# Patient Record
Sex: Female | Born: 1990 | Race: Black or African American | Hispanic: No | Marital: Single | Smoking: Former smoker
Health system: Southern US, Community
[De-identification: ages and names within clinical notes are randomized; demographics above are authoritative.]

## PROBLEM LIST (undated history)

## (undated) ENCOUNTER — Ambulatory Visit: Admission: EM

## (undated) DIAGNOSIS — G43909 Migraine, unspecified, not intractable, without status migrainosus: Secondary | ICD-10-CM

## (undated) DIAGNOSIS — F32A Depression, unspecified: Secondary | ICD-10-CM

## (undated) DIAGNOSIS — F329 Major depressive disorder, single episode, unspecified: Secondary | ICD-10-CM

## (undated) DIAGNOSIS — T7840XA Allergy, unspecified, initial encounter: Secondary | ICD-10-CM

## (undated) DIAGNOSIS — F419 Anxiety disorder, unspecified: Secondary | ICD-10-CM

## (undated) HISTORY — DX: Anxiety disorder, unspecified: F41.9

## (undated) HISTORY — PX: ADENOIDECTOMY: SUR15

## (undated) HISTORY — DX: Allergy, unspecified, initial encounter: T78.40XA

## (undated) HISTORY — PX: TONSILLECTOMY: SUR1361

## (undated) HISTORY — DX: Major depressive disorder, single episode, unspecified: F32.9

## (undated) HISTORY — DX: Migraine, unspecified, not intractable, without status migrainosus: G43.909

## (undated) HISTORY — DX: Depression, unspecified: F32.A

---
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
## 2010-08-29 ENCOUNTER — Ambulatory Visit: Payer: Self-pay | Admitting: Internal Medicine

## 2010-08-29 DIAGNOSIS — Z0289 Encounter for other administrative examinations: Secondary | ICD-10-CM
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
## 2011-02-08 LAB — DIFFERENTIAL
Basophils Absolute: 0.1 10*3/uL (ref 0.0–0.1)
Eosinophils Absolute: 0 10*3/uL (ref 0.0–0.7)
Eosinophils Relative: 1 % (ref 0–5)
Lymphocytes Relative: 51 % — ABNORMAL HIGH (ref 12–46)
Lymphs Abs: 2.7 10*3/uL (ref 0.7–4.0)
Neutrophils Relative %: 44 % (ref 43–77)

## 2011-02-08 LAB — CBC
HCT: 38.8 % (ref 36.0–46.0)
MCV: 92.7 fL (ref 78.0–100.0)
Platelets: 207 10*3/uL (ref 150–400)
RDW: 13.6 % (ref 11.5–15.5)
WBC: 5.4 10*3/uL (ref 4.0–10.5)

## 2011-02-08 LAB — POCT I-STAT, CHEM 8
Calcium, Ion: 1.29 mmol/L (ref 1.12–1.32)
HCT: 41 % (ref 36.0–46.0)
Sodium: 142 mEq/L (ref 135–145)
TCO2: 25 mmol/L (ref 0–100)

## 2011-02-08 LAB — D-DIMER, QUANTITATIVE: D-Dimer, Quant: <0.22 ug/mL-FEU (ref 0.00–0.48)

## 2011-04-17 ENCOUNTER — Ambulatory Visit: Payer: Self-pay | Admitting: Internal Medicine

## 2011-04-17 DIAGNOSIS — Z0289 Encounter for other administrative examinations: Secondary | ICD-10-CM

## 2012-07-13 ENCOUNTER — Encounter (HOSPITAL_COMMUNITY): Payer: Self-pay | Admitting: *Deleted

## 2012-07-13 ENCOUNTER — Emergency Department (HOSPITAL_COMMUNITY)
Admission: EM | Admit: 2012-07-13 | Discharge: 2012-07-13 | Disposition: A | Discharge status: Home / Self Care | Payer: 59 | Source: Home / Self Care

## 2012-07-13 DIAGNOSIS — R0789 Other chest pain: Secondary | ICD-10-CM

## 2012-07-13 DIAGNOSIS — H8110 Benign paroxysmal vertigo, unspecified ear: Secondary | ICD-10-CM

## 2012-07-13 DIAGNOSIS — H9190 Unspecified hearing loss, unspecified ear: Secondary | ICD-10-CM

## 2012-07-13 DIAGNOSIS — H9192 Unspecified hearing loss, left ear: Secondary | ICD-10-CM

## 2012-07-13 DIAGNOSIS — R071 Chest pain on breathing: Secondary | ICD-10-CM

## 2012-07-13 NOTE — ED Notes (Signed)
Pt  Has  multiple  Symptoms  To include  Chest  Wall   pai   Worse  When  She  Performs  Certain  Movements  And  posistions                 She  Also  Reports  Symptoms  ogf  Being  Dizzy   As  Well       -  She  Reports  l  Ear  Muffled  X  1  Week  - the  Chest pain  Have  Been  For  About  4  Day  -  Heshe is  Sitting  Upright on the  Exam table  She  Is  Speaking in  Complete  sentances  And  Is  In no  acute  Distress

## 2012-07-13 NOTE — ED Provider Notes (Signed)
History     CSN: [id number]  Arrival date & time 07/13/12  1614   First MD Initiated Contact with Patient 07/13/12 1837      Chief Complaint  Patient presents with  . Chest Pain    (Consider location/radiation/quality/duration/timing/severity/associated sxs/prior treatment) HPI Comments: Pt also c/o muffled hearing in L ear for 1 week. Wonders if has an infection because wears a headset on that side at work.  Also c/o dizziness, room spinning for 3-4 days.   Patient is a 22 y.o. female presenting with chest pain. The history is provided by the patient.  Chest Pain Chest pain location: B middle chest. Pain quality comment:  Sore Pain radiates to:  Does not radiate Pain severity:  Moderate Onset quality:  Gradual Duration:  1 week Timing:  Constant Progression:  Unchanged Chronicity:  New Context comment:  Activity, movement, pushing on it Relieved by:  Nothing Exacerbated by: activity, movement, pushing on it. Ineffective treatments: ibuprofen BID. Associated symptoms: dizziness   Associated symptoms: no back pain, no cough, no fever, no headache, no nausea, no numbness, no palpitations, no shortness of breath, no syncope and not vomiting     History reviewed. No pertinent past medical history.  History reviewed. No pertinent past surgical history.  History reviewed. No pertinent family history.  History  Substance Use Topics  . Smoking status: Never Smoker   . Smokeless tobacco: Not on file  . Alcohol Use: Yes    OB History   Grav Para Term Preterm Abortions TAB SAB Ect Mult Living                  Review of Systems  Constitutional: Negative for fever and chills.  HENT: Positive for hearing loss. Negative for ear pain, congestion, sinus pressure, tinnitus and ear discharge.   Respiratory: Negative for cough and shortness of breath.   Cardiovascular: Negative for chest pain, palpitations and syncope.  Gastrointestinal: Negative for nausea and vomiting.    Musculoskeletal: Negative for back pain.       Chest pain  Neurological: Positive for dizziness. Negative for syncope, numbness and headaches.    Allergies  Review of patient's allergies indicates no known allergies.  Home Medications   Current Outpatient Rx  Name  Route  Sig  Dispense  Refill  . PARoxetine HCl (PAXIL PO)   Oral   Take by mouth.           BP 135/63  Pulse 60  Temp(Src) 98.9 F (37.2 C) (Oral)  Resp 16  SpO2 100%  LMP 07/08/2012  Physical Exam  Constitutional: She is oriented to person, place, and time. She appears well-developed and well-nourished. No distress.  HENT:  Right Ear: Tympanic membrane and ear canal normal.  Left Ear: Tympanic membrane and ear canal normal.  Eyes: Conjunctivae and EOM are normal. Pupils are equal, round, and reactive to light.  Cardiovascular: Normal rate and regular rhythm.   Pulmonary/Chest: Effort normal and breath sounds normal. She exhibits tenderness. She exhibits no mass, no bony tenderness, no edema and no deformity.    Neurological: She is alert and oriented to person, place, and time. Coordination and gait normal.  Positive dix-hallpike to R side    ED Course  Procedures (including critical care time)  Labs Reviewed - No data to display No results found.   1. BPPV (benign paroxysmal positional vertigo)   2. Chest wall pain   3. Change in hearing, left       MDM  Performed epley maneuver and pt's vertigo completely resolved. Pt to f/u with ent for hearing changes.         Cathlyn Parsons, NP 07/13/12 1845

## 2013-01-07 ENCOUNTER — Ambulatory Visit: Payer: Self-pay | Admitting: Internal Medicine

## 2013-04-08 ENCOUNTER — Emergency Department (HOSPITAL_COMMUNITY)
Admission: EM | Admit: 2013-04-08 | Discharge: 2013-04-08 | Disposition: A | Discharge status: Home / Self Care | Payer: 59 | Attending: Emergency Medicine | Admitting: Emergency Medicine

## 2013-04-08 ENCOUNTER — Encounter (HOSPITAL_COMMUNITY): Payer: Self-pay | Admitting: Emergency Medicine

## 2013-04-08 DIAGNOSIS — R197 Diarrhea, unspecified: Secondary | ICD-10-CM | POA: Insufficient documentation

## 2013-04-08 DIAGNOSIS — Z79899 Other long term (current) drug therapy: Secondary | ICD-10-CM | POA: Insufficient documentation

## 2013-04-08 DIAGNOSIS — R112 Nausea with vomiting, unspecified: Secondary | ICD-10-CM | POA: Insufficient documentation

## 2013-04-08 LAB — CBC WITH DIFFERENTIAL/PLATELET
Basophils Absolute: 0 10*3/uL (ref 0.0–0.1)
Lymphocytes Relative: 13 % (ref 12–46)
Lymphs Abs: 1.3 10*3/uL (ref 0.7–4.0)
Neutro Abs: 8.4 10*3/uL — ABNORMAL HIGH (ref 1.7–7.7)
Neutrophils Relative %: 82 % — ABNORMAL HIGH (ref 43–77)
Platelets: 172 10*3/uL (ref 150–400)
RBC: 5.02 MIL/uL (ref 3.87–5.11)
RDW: 13.3 % (ref 11.5–15.5)
WBC: 10.2 10*3/uL (ref 4.0–10.5)

## 2013-04-08 LAB — COMPREHENSIVE METABOLIC PANEL
ALT: 16 U/L (ref 0–35)
AST: 21 U/L (ref 0–37)
Alkaline Phosphatase: 59 U/L (ref 39–117)
CO2: 20 mEq/L (ref 19–32)
Calcium: 10.2 mg/dL (ref 8.4–10.5)
Chloride: 101 mEq/L (ref 96–112)
GFR calc non Af Amer: >90 mL/min (ref >90)
Potassium: 3.6 mEq/L (ref 3.5–5.1)
Sodium: 138 mEq/L (ref 135–145)

## 2013-04-08 MED ORDER — ONDANSETRON 4 MG PO TBDP: 4.0000 mg | ORAL_TABLET | Freq: Three times a day (TID) | ORAL | Status: DC | PRN

## 2013-04-08 MED ORDER — MORPHINE SULFATE 4 MG/ML IJ SOLN
4.0000 mg | Freq: Once | INTRAMUSCULAR | Status: AC
  Administered 2013-04-08: 4 mg via INTRAVENOUS
  Filled 2013-04-08: qty 1

## 2013-04-08 MED ORDER — SODIUM CHLORIDE 0.9 % IV BOLUS (SEPSIS)
1000.0000 mL | Freq: Once | INTRAVENOUS | Status: AC
  Administered 2013-04-08: 1000 mL via INTRAVENOUS

## 2013-04-08 MED ORDER — ONDANSETRON HCL 4 MG/2ML IJ SOLN
4.0000 mg | Freq: Once | INTRAMUSCULAR | Status: AC
  Administered 2013-04-08: 4 mg via INTRAVENOUS
  Filled 2013-04-08: qty 2

## 2013-04-08 NOTE — ED Provider Notes (Signed)
CSN: [id number]     Arrival date & time 04/08/13  1232 History   First MD Initiated Contact with Patient 04/08/13 1251     Chief Complaint  Patient presents with  . Nausea  . Diarrhea   (Consider location/radiation/quality/duration/timing/severity/associated sxs/prior Treatment) Patient is a 22 y.o. female presenting with diarrhea. The history is provided by the patient and medical records.  Diarrhea Associated symptoms: vomiting    This is a 22 year old female with no significant past medical history presenting to the ED for nausea, vomiting, and diarrhea. Patient states symptoms began with only diarrhea approximately 3 days ago. She has been having intermittent nausea for the past 2 days, but became constant last night and has had recurrent non bloody, non bililous, vomiting throughout the day today. She's been unable to tolerate anything PO today. She admits to subjective fever, sweats, and chills. She denies any recent sick contacts.  No changes in diet or meds.  No recent abx use.  VS stable on arrival.  History reviewed. No pertinent past medical history. History reviewed. No pertinent past surgical history. History reviewed. No pertinent family history. History  Substance Use Topics  . Smoking status: Never Smoker   . Smokeless tobacco: Not on file  . Alcohol Use: Yes   OB History   Grav Para Term Preterm Abortions TAB SAB Ect Mult Living                 Review of Systems  Gastrointestinal: Positive for nausea, vomiting and diarrhea.  All other systems reviewed and are negative.    Allergies  Review of patient's allergies indicates no known allergies.  Home Medications   Current Outpatient Rx  Name  Route  Sig  Dispense  Refill  . PARoxetine HCl (PAXIL PO)   Oral   Take 10 mg by mouth daily.           BP 124/80  Pulse 81  Temp(Src) 98 F (36.7 C) (Oral)  Resp 16  Wt 175 lb (79.379 kg)  SpO2 100%  LMP 02/06/2013  Physical Exam  Nursing note and vitals  reviewed. Constitutional: She is oriented to person, place, and time. She appears well-developed and well-nourished. No distress.  HENT:  Head: Normocephalic and atraumatic.  Mouth/Throat: Oropharynx is clear and moist.  Eyes: Conjunctivae and EOM are normal. Pupils are equal, round, and reactive to light.  Neck: Normal range of motion. Neck supple.  Cardiovascular: Normal rate, regular rhythm and normal heart sounds.   Pulmonary/Chest: Effort normal and breath sounds normal. No respiratory distress. She has no wheezes.  Abdominal: Soft. Bowel sounds are normal. There is generalized tenderness. There is no guarding.  Abdomen soft, nondistended, generalized pain  Musculoskeletal: Normal range of motion.  Neurological: She is alert and oriented to person, place, and time.  Skin: Skin is warm and dry. She is not diaphoretic.  Psychiatric: She has a normal mood and affect.    ED Course  Procedures (including critical care time) Labs Review Labs Reviewed  CBC WITH DIFFERENTIAL - Abnormal; Notable for the following:    Neutrophils Relative % 82 (*)    Neutro Abs 8.4 (*)    All other components within normal limits  COMPREHENSIVE METABOLIC PANEL - Abnormal; Notable for the following:    Glucose, Bld 107 (*)    Total Protein 8.4 (*)    All other components within normal limits  URINALYSIS, ROUTINE W REFLEX MICROSCOPIC   Imaging Review No results found.  EKG Interpretation  None       MDM   1. Nausea & vomiting   2. Diarrhea    Labs as above, no leukocytosis.  Symptoms greatly improved following IVF and meds.  Pt has been able to tolerate PO without difficulty or recurrent vomiting.  At this time i doubt acute or surgical abdomen, sx likely viral in nature.  Pt afebrile, non-toxic appearing, NAD, VS stable- ok for discharge.  Rx zofran.  Instructed to stay hydrated, start with bland diet and progress as tolerated.  Discussed plan with pt, she agreed.  Return precautions  advised.    Garlon Hatchet, PA-C 04/08/13 1523

## 2013-04-08 NOTE — ED Notes (Signed)
Pt aware of need for urine specimen. Unable to void at this time. 

## 2013-04-08 NOTE — ED Notes (Signed)
Pt reminded of need for urine. States she will be able to urinate soon.

## 2013-04-08 NOTE — ED Provider Notes (Signed)
Medical screening examination/treatment/procedure(s) were performed by non-physician practitioner and as supervising physician I was immediately available for consultation/collaboration.  EKG Interpretation   None         Megan E Docherty, MD 04/08/13 1608 

## 2013-04-08 NOTE — ED Notes (Signed)
Pt alert, nad, resp even unlabored, c/o nausea, emesis, diarrhea, onset was a few days ago

## 2013-04-13 ENCOUNTER — Emergency Department (HOSPITAL_COMMUNITY)
Admission: EM | Admit: 2013-04-13 | Discharge: 2013-04-13 | Disposition: A | Discharge status: Home / Self Care | Payer: 59 | Attending: Emergency Medicine | Admitting: Emergency Medicine

## 2013-04-13 ENCOUNTER — Encounter (HOSPITAL_COMMUNITY): Payer: Self-pay | Admitting: Emergency Medicine

## 2013-04-13 DIAGNOSIS — G43909 Migraine, unspecified, not intractable, without status migrainosus: Secondary | ICD-10-CM | POA: Insufficient documentation

## 2013-04-13 DIAGNOSIS — Z791 Long term (current) use of non-steroidal anti-inflammatories (NSAID): Secondary | ICD-10-CM | POA: Insufficient documentation

## 2013-04-13 DIAGNOSIS — Z79899 Other long term (current) drug therapy: Secondary | ICD-10-CM | POA: Insufficient documentation

## 2013-04-13 DIAGNOSIS — Z3202 Encounter for pregnancy test, result negative: Secondary | ICD-10-CM | POA: Insufficient documentation

## 2013-04-13 LAB — POCT I-STAT, CHEM 8
BUN: 6 mg/dL (ref 6–23)
Creatinine, Ser: 0.6 mg/dL (ref 0.50–1.10)
Potassium: 3.9 mEq/L (ref 3.5–5.1)
Sodium: 143 mEq/L (ref 135–145)
TCO2: 24 mmol/L (ref 0–100)

## 2013-04-13 LAB — URINALYSIS, ROUTINE W REFLEX MICROSCOPIC
Bilirubin Urine: NEGATIVE
Glucose, UA: NEGATIVE mg/dL
Hgb urine dipstick: NEGATIVE
Ketones, ur: NEGATIVE mg/dL
Nitrite: NEGATIVE
Protein, ur: NEGATIVE mg/dL
Specific Gravity, Urine: 1.011 (ref 1.005–1.030)
Urobilinogen, UA: 0.2 mg/dL (ref 0.0–1.0)
pH: 7.5 (ref 5.0–8.0)

## 2013-04-13 LAB — URINE MICROSCOPIC-ADD ON

## 2013-04-13 LAB — PREGNANCY, URINE: Preg Test, Ur: NEGATIVE

## 2013-04-13 MED ORDER — SODIUM CHLORIDE 0.9 % IV BOLUS (SEPSIS)
2000.0000 mL | Freq: Once | INTRAVENOUS | Status: AC
  Administered 2013-04-13: 2000 mL via INTRAVENOUS

## 2013-04-13 MED ORDER — DIPHENHYDRAMINE HCL 50 MG/ML IJ SOLN
25.0000 mg | Freq: Once | INTRAMUSCULAR | Status: AC
  Administered 2013-04-13: 25 mg via INTRAVENOUS
  Filled 2013-04-13: qty 1

## 2013-04-13 MED ORDER — DEXAMETHASONE SODIUM PHOSPHATE 10 MG/ML IJ SOLN
10.0000 mg | Freq: Once | INTRAMUSCULAR | Status: AC
  Administered 2013-04-13: 10 mg via INTRAVENOUS
  Filled 2013-04-13: qty 1

## 2013-04-13 MED ORDER — PROCHLORPERAZINE EDISYLATE 5 MG/ML IJ SOLN
10.0000 mg | Freq: Once | INTRAMUSCULAR | Status: AC
  Administered 2013-04-13: 10 mg via INTRAVENOUS
  Filled 2013-04-13: qty 2

## 2013-04-13 MED ORDER — KETOROLAC TROMETHAMINE 30 MG/ML IJ SOLN
30.0000 mg | Freq: Once | INTRAMUSCULAR | Status: AC
  Administered 2013-04-13: 30 mg via INTRAVENOUS
  Filled 2013-04-13: qty 1

## 2013-04-13 NOTE — Progress Notes (Signed)
   CARE MANAGEMENT ED NOTE 04/13/2013  Patient:  Tracy Cohen, Tracy Cohen   Account Number:  [id number]  Date Initiated:  04/13/2013  Documentation initiated by:  Edd Arbour  Subjective/Objective Assessment:   22 yr old female  umr pt with 2 ED visits in last 6 months no admissions Pt listed without a pcp Last ED visit for N/V/D this ED vist for headache     Subjective/Objective Assessment Detail:     Action/Plan:   CM spoke with pt, her mother, EDP/NP CM spoke with pt about importance of pcp, specialist follow care CM provided pt with a list of internal medicine & neurologist [place] 5-10 [place] [address] as listed in EPIC   Action/Plan Detail:   Anticipated DC Date:  04/13/2013     Status Recommendation to Physician:   Result of Recommendation:    Other ED Services  Consult Working Plan    DC Planning Services  Other  PCP issues  Outpatient Services - Pt will follow up    Choice offered to / List presented to:            Status of service:  Completed, signed off  ED Comments:   ED Comments Detail:

## 2013-04-13 NOTE — ED Notes (Addendum)
Pt c/o migraine headache x 8 days; history of migraines; constant; states is getting worse; c/o dizziness; states eyes wont focus; states worst headache ever

## 2013-04-13 NOTE — ED Provider Notes (Signed)
CSN: [id number]     Arrival date & time 04/13/13  1119 History   First MD Initiated Contact with Patient 04/13/13 1138     Chief Complaint  Patient presents with  . Migraine   (Consider location/radiation/quality/duration/timing/severity/associated sxs/prior Treatment) HPI Patient presents emergency department with migraine headache, which been ongoing for the last 4 days.  Patient, states she was seen in emergency department.  This past Thursday she did not mention her headache.  Patient was seen for nausea, vomiting, and diarrhea.  Patient denies blurred vision, weakness, numbness, dizziness, syncope, fever, chest pain, or shortness of breath.  Patient, states, that she did not take any medications prior to arrival for her symptoms.  Patient, states, that light makes her headache, worse History reviewed. No pertinent past medical history. History reviewed. No pertinent past surgical history. No family history on file. History  Substance Use Topics  . Smoking status: Never Smoker   . Smokeless tobacco: Not on file  . Alcohol Use: Yes   OB History   Grav Para Term Preterm Abortions TAB SAB Ect Mult Living                 Review of Systems All other systems negative except as documented in the HPI. All pertinent positives and negatives as reviewed in the HPI.  Allergies  Review of patient's allergies indicates no known allergies.  Home Medications   Current Outpatient Rx  Name  Route  Sig  Dispense  Refill  . etonogestrel (IMPLANON) 68 MG IMPL implant   Subcutaneous   Inject 1 each into the skin once. Placed in Jan 2014         . naproxen sodium (ANAPROX) 220 MG tablet   Oral   Take 440 mg by mouth 2 (two) times daily with a meal.         . PARoxetine HCl (PAXIL PO)   Oral   Take 10 mg by mouth daily.           BP 124/81  Pulse 77  Temp(Src) 98.2 F (36.8 C) (Oral)  Resp 16  SpO2 100%  LMP 02/06/2013 Physical Exam  Nursing note and vitals  reviewed. Constitutional: She is oriented to person, place, and time. She appears well-developed and well-nourished.  HENT:  Head: Normocephalic and atraumatic.  Eyes: EOM are normal. Pupils are equal, round, and reactive to light.  Neck: Normal range of motion. Neck supple.  Cardiovascular: Normal rate, regular rhythm and normal heart sounds.  Exam reveals no gallop and no friction rub.   No murmur heard. Pulmonary/Chest: Effort normal and breath sounds normal.  Neurological: She is alert and oriented to person, place, and time. She exhibits normal muscle tone. Coordination normal.  Skin: Skin is warm and dry.    ED Course  Procedures (including critical care time) Labs Review Labs Reviewed  URINALYSIS, ROUTINE W REFLEX MICROSCOPIC - Abnormal; Notable for the following:    APPearance CLOUDY (*)    Leukocytes, UA SMALL (*)    All other components within normal limits  POCT I-STAT, CHEM 8 - Abnormal; Notable for the following:    Hemoglobin 15.6 (*)    All other components within normal limits  PREGNANCY, URINE  URINE MICROSCOPIC-ADD ON    Patient has complete resolution of her headache.  Following Toradol, Benadryl, Compazine and Decadron with IV fluids.  Patient is referred to go for neurology.  She is told to return here for any worsening in her condition  Jamesetta Orleans  Murphy Duzan, PA-C 04/14/13 1536

## 2013-04-16 NOTE — ED Provider Notes (Signed)
Medical screening examination/treatment/procedure(s) were performed by non-physician practitioner and as supervising physician I was immediately available for consultation/collaboration.  EKG Interpretation   None         Lillyann Ahart M Ryah Cribb, MD 04/16/13 2305 

## 2013-04-18 ENCOUNTER — Encounter (HOSPITAL_COMMUNITY): Payer: Self-pay | Admitting: Emergency Medicine

## 2013-04-18 ENCOUNTER — Emergency Department (HOSPITAL_COMMUNITY)
Admission: EM | Admit: 2013-04-18 | Discharge: 2013-04-18 | Discharge status: Against Medical Advice | Payer: 59 | Attending: Emergency Medicine | Admitting: Emergency Medicine

## 2013-04-18 DIAGNOSIS — R110 Nausea: Secondary | ICD-10-CM | POA: Insufficient documentation

## 2013-04-18 DIAGNOSIS — G43909 Migraine, unspecified, not intractable, without status migrainosus: Secondary | ICD-10-CM | POA: Insufficient documentation

## 2013-04-18 NOTE — ED Notes (Signed)
No answer when pt's name called in lobby, unable to locate pt 

## 2013-04-18 NOTE — ED Notes (Signed)
Pt states that migraine has been going on since the last time she was seen here for it, which was 04/13/13. Pt nauseated but denies vomiting.

## 2013-04-18 NOTE — ED Notes (Signed)
No answer when pt called in lobby, unable to locate pt.

## 2013-04-20 ENCOUNTER — Ambulatory Visit (INDEPENDENT_AMBULATORY_CARE_PROVIDER_SITE_OTHER): Payer: 59

## 2013-04-20 ENCOUNTER — Encounter: Payer: Self-pay | Admitting: Neurology

## 2013-04-20 ENCOUNTER — Ambulatory Visit (INDEPENDENT_AMBULATORY_CARE_PROVIDER_SITE_OTHER): Payer: 59 | Admitting: Neurology

## 2013-04-20 VITALS — BP 121/82 | HR 67 | Ht 64.0 in | Wt 160.0 lb

## 2013-04-20 VITALS — BP 111/73 | HR 66 | Temp 99.3°F

## 2013-04-20 DIAGNOSIS — G43909 Migraine, unspecified, not intractable, without status migrainosus: Secondary | ICD-10-CM

## 2013-04-20 MED ORDER — VALPROATE SODIUM 500 MG/5ML IV SOLN
1000.0000 mg | INTRAVENOUS | Status: DC
  Administered 2013-04-20: 1000 mg via INTRAVENOUS

## 2013-04-20 MED ORDER — VALPROATE SODIUM 500 MG/5ML IV SOLN: 1000.0000 mg | INTRAVENOUS | Status: AC

## 2013-04-20 MED ORDER — KETOROLAC TROMETHAMINE 30 MG/ML IJ SOLN
30.0000 mg | Freq: Once | INTRAMUSCULAR | Status: AC
  Administered 2013-04-20: 30 mg via INTRAMUSCULAR

## 2013-04-20 MED ORDER — SODIUM CHLORIDE 0.9 % IV SOLN: 125.0000 mg | Freq: Once | INTRAVENOUS | Status: DC

## 2013-04-20 MED ORDER — RIZATRIPTAN BENZOATE 10 MG PO TBDP: 10.0000 mg | ORAL_TABLET | ORAL | Status: DC | PRN

## 2013-04-20 MED ORDER — NORTRIPTYLINE HCL 10 MG PO CAPS: ORAL_CAPSULE | ORAL | Status: DC

## 2013-04-20 MED ORDER — SODIUM CHLORIDE 0.9 % IV SOLN
125.0000 mg | INTRAVENOUS | Status: DC
  Administered 2013-04-20: 125 mg via INTRAVENOUS

## 2013-04-20 MED ORDER — SODIUM CHLORIDE 0.9 % IV SOLN: 125.0000 mg | INTRAVENOUS | Status: AC

## 2013-04-20 MED ORDER — SODIUM CHLORIDE 0.9 % IV SOLN: 125.0000 mg | INTRAVENOUS | Status: DC

## 2013-04-20 NOTE — Patient Instructions (Signed)
Counseled on prevention of migraines through good rest, exercise, hydration, stress reduction and medication management. A letter was generated for and give to the patient for lost time from work along with the AVS.

## 2013-04-20 NOTE — Progress Notes (Signed)
First dose of depacon administered 500 mg followed by second dose of 500 mg and then followed by solumedrol 125 mg via IV solution of 0.9% NS solution (total 0.9% NS Solution - 300 ML) administered under aseptic technique.   Patient reports pain levels of 12/10 prior to initial administration. Pain of 8/10 after solumedrol. Administered toradol IM under aseptic technique.   IV removed and bandage applied.  All administration tolerated well.

## 2013-04-23 ENCOUNTER — Telehealth: Payer: Self-pay | Admitting: *Deleted

## 2013-04-23 DIAGNOSIS — G43909 Migraine, unspecified, not intractable, without status migrainosus: Secondary | ICD-10-CM | POA: Insufficient documentation

## 2013-04-23 NOTE — Telephone Encounter (Signed)
I have called her fail to reach her, left message.,  Nortriptyline will take few weeks to take effect. Maxalt as needed, keep follow up.

## 2013-04-23 NOTE — Progress Notes (Signed)
[facility]  PATIENT: Tracy Cohen DOB: 05-14-1990  HISTORICAL Tracy Cohen is a 22 years old right-handed Caucasian female, referred by emergency room for evaluation of migraine headaches  She had episodes of migraine headaches when she was younger, in her teens, but only rarely happen, over the past 5 years, she began to have frequent headaches, about twice a month, she complains lightheaded, dizziness, nausea, with her headaches, relieved by lying darkened quiet room.  She had diarrhea, nausea woman in December fourth, went to the emergency room, symptoms improved after IV fluid, woke up December 9th 2014, noticed severe pounding headaches, holocranial, with associated light noise sensitivity, nauseous, vomitting, she presented to the emergency room again, was given IV cocktail of Compazine, Decadron, Toradol, only with temporary relief of her headaches,  She came in today, complains of persistent headaches since December ninth, nausea or vomiting, difficulty eating, described 12 out of 10 headache today, was associated light sensitivity, she denies visual change, denies lateralized motor or sensory deficit,    REVIEW OF SYSTEMS: Full 14 system review of systems performed and notable only for eye pain, headache, weakness, dizziness, not enough sleep, decreased appetite   ALLERGIES: No Known Allergies  HOME MEDICATIONS: Outpatient Prescriptions Prior to Visit  Medication Sig Dispense Refill  . etonogestrel (IMPLANON) 68 MG IMPL implant Inject 1 each into the skin once. Placed in Jan 2014      . naproxen sodium (ANAPROX) 220 MG tablet Take 440 mg by mouth 2 (two) times daily with a meal.      . PARoxetine HCl (PAXIL PO) Take 10 mg by mouth daily.        No facility-administered medications prior to visit.    PAST MEDICAL HISTORY: Past Medical History  Diagnosis Date  . Migraine   . Headache   . Weakness   . Dizziness   . Anxiety     PAST SURGICAL  HISTORY: Past Surgical History  Procedure Laterality Date  . Tonsillectomy    . Adenoidectomy      FAMILY HISTORY: Family History  Problem Relation Age of Onset  . High blood pressure Mother     SOCIAL HISTORY:  History   Social History  . Marital Status: Married    Spouse Name: N/A    Number of Children: 0  . Years of Education: 13   Occupational History  .      [employer]   Social History Main Topics  . Smoking status: Never Smoker   . Smokeless tobacco: Never Used  . Alcohol Use: Yes  . Drug Use: No  . Sexual Activity: Not on file   Other Topics Concern  . Not on file   Social History Narrative   Patient lives at home alone.    Patient works for [employer]   Patient has some college education.   Right handed.           PHYSICAL EXAM   Filed Vitals:   04/20/13 0813  BP: 121/82  Pulse: 67  Height: 5\' 4"  (1.626 m)  Weight: 160 lb (72.576 kg)    Not recorded    Body mass index is 27.45 kg/(m^2).   Generalized: In no acute distress  Neck: Supple, no carotid bruits   Cardiac: Regular rate rhythm  Pulmonary: Clear to auscultation bilaterally  Musculoskeletal: No deformity  Neurological examination  Mentation:  depressed looking young female, alert oriented to time, place, history taking, and causual conversation  Cranial nerve II-XII:  Pupils were equal round reactive to light extraocular movements were full, Visual field were full on confrontational test. Bilateral fundi were sharp.  Facial sensation and strength were normal. Hearing was intact to finger rubbing bilaterally. Uvula tongue midline.  head turning and shoulder shrug and were normal and symmetric.Tongue protrusion into cheek strength was normal.  Motor: normal tone, bulk and strength.  Sensory: Intact to fine touch, pinprick, preserved vibratory sensation, and proprioception at toes.  Coordination: Normal finger to nose, heel-to-shin bilaterally there was no truncal  ataxia  Gait: Rising up from seated position without assistance, normal stance, without trunk ataxia, moderate stride, good arm swing, smooth turning, able to perform tiptoe, and heel walking without difficulty.   Romberg signs: Negative  Deep tendon reflexes: Brachioradialis 2/2, biceps 2/2, triceps 2/2, patellar 2/2, Achilles 2/2, plantar responses were flexor bilaterally.   DIAGNOSTIC DATA (LABS, IMAGING, TESTING) - I reviewed patient records, labs, notes, testing and imaging myself where available.  Lab Results  Component Value Date   WBC 10.2 04/08/2013   HGB 15.6* 04/13/2013   HCT 46.0 04/13/2013   MCV 80.9 04/08/2013   PLT 172 04/08/2013      Component Value Date/Time   NA 143 04/13/2013 1233   K 3.9 04/13/2013 1233   CL 104 04/13/2013 1233   CO2 20 04/08/2013 1358   GLUCOSE 92 04/13/2013 1233   BUN 6 04/13/2013 1233   CREATININE 0.60 04/13/2013 1233   CALCIUM 10.2 04/08/2013 1358   PROT 8.4* 04/08/2013 1358   ALBUMIN 4.8 04/08/2013 1358   AST 21 04/08/2013 1358   ALT 16 04/08/2013 1358   ALKPHOS 59 04/08/2013 1358   BILITOT 0.5 04/08/2013 1358   GFRNONAA >90 04/08/2013 1358   GFRAA >90 04/08/2013 1358   ASSESSMENT AND PLAN   22 years old female, with previous history of migraine headaches, now presenting with prolonged severe headache, migraine features, normal neurological examinations.  1.  I have treated her with IV medication today at office including IV Depacon, Solu-Medrol, Toradol,  2. nortriptyline as preventive medications 3. Maxalt as needed 4 return to clinic with Eber Jones in 3 months        Levert Feinstein, M.D. Ph.D.  [facility] [address] [place], [place] [address] [phone]

## 2013-05-24 ENCOUNTER — Telehealth: Payer: Self-pay | Admitting: Neurology

## 2013-05-24 NOTE — Telephone Encounter (Signed)
Notriptyline is not available in 20mg  caps.  10mg  Rx was sent to last one year by Dr Terrace ArabiaYan last month.  I called the patient back.  Explained this med is not avail in 20mg .  Patient verbalized understanding and will continue taking two of the 10mg .

## 2013-05-24 NOTE — Telephone Encounter (Signed)
Wants a new RX with instructions to take one tablet a day 20mg  instead of having to take 2 10mg   Tablets she only has 3days left.

## 2013-05-31 ENCOUNTER — Ambulatory Visit: Payer: Self-pay | Admitting: Internal Medicine

## 2013-05-31 DIAGNOSIS — Z0289 Encounter for other administrative examinations: Secondary | ICD-10-CM

## 2013-07-19 ENCOUNTER — Ambulatory Visit: Payer: 59 | Admitting: Neurology

## 2013-09-01 ENCOUNTER — Encounter: Payer: Self-pay | Admitting: Nurse Practitioner

## 2013-09-01 ENCOUNTER — Ambulatory Visit (INDEPENDENT_AMBULATORY_CARE_PROVIDER_SITE_OTHER): Payer: No Typology Code available for payment source | Admitting: Nurse Practitioner

## 2013-09-01 ENCOUNTER — Encounter (INDEPENDENT_AMBULATORY_CARE_PROVIDER_SITE_OTHER): Payer: Self-pay

## 2013-09-01 VITALS — BP 116/81 | HR 67 | Ht 64.0 in | Wt 164.0 lb

## 2013-09-01 DIAGNOSIS — G43909 Migraine, unspecified, not intractable, without status migrainosus: Secondary | ICD-10-CM

## 2013-09-01 DIAGNOSIS — F411 Generalized anxiety disorder: Secondary | ICD-10-CM

## 2013-09-01 MED ORDER — ESCITALOPRAM OXALATE 10 MG PO TABS: 10.0000 mg | ORAL_TABLET | Freq: Every day | ORAL | Status: DC

## 2013-09-01 NOTE — Progress Notes (Signed)
PATIENT: Mcneil SoberMary F Schaner DOB: [birth year]  REASON FOR VISIT: follow up for Migraine HISTORY FROM: patient  HISTORY OF PRESENT ILLNESS: 04/20/13 (YY): [name] is a 23 years old right-handed Caucasian female, referred by emergency room for evaluation of migraine headaches  She had episodes of migraine headaches when she was younger, in her teens, but only rarely happen, over the past 5 years, she began to have frequent headaches, about twice a month, she complains lightheaded, dizziness, nausea, with her headaches, relieved by lying darkened quiet room.  She had diarrhea, nausea December fourth, went to the emergency room, symptoms improved after IV fluid, woke up December 9th 2014, noticed severe pounding headaches, holocranial, with associated light noise sensitivity, nauseous, vomitting, she presented to the emergency room again, was given IV cocktail of Compazine, Decadron, Toradol, only with temporary relief of her headaches,   She came in today, complains of persistent headaches since December ninth, nausea or vomiting, difficulty eating, described 12 out of 10 headache today, was associated light sensitivity, she denies visual change, denies lateralized motor or sensory deficit.  UPDATE 09/01/13 (LL):  [name] comes back in today, very tearful, states that her insurance changed and she could not see her previous PCP and her Paxil ran out last month.  She states she is very anxious, cries a lot, but denies any suicidal ideation.  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  She is taking Nortriptyline 20 mg each night, which has helped headache somewhat at first, but she states since out of antidepressant, headaches are much worse.   REVIEW OF SYSTEMS: Full 14 system review of systems performed and notable only for unexpected weight change, headache,  Dizziness,frequent waking, depression, anxiety  ALLERGIES: No Known Allergies  HOME MEDICATIONS: Outpatient Prescriptions Prior to Visit    Medication Sig Dispense Refill  . etonogestrel (IMPLANON) 68 MG IMPL implant Inject 1 each into the skin once. Placed in Jan 2014      . nortriptyline (PAMELOR) 10 MG capsule One po qhs xone week, then 2 tabs po qhs  60 capsule  12  . naproxen sodium (ANAPROX) 220 MG tablet Take 440 mg by mouth 2 (two) times daily with a meal.      . PARoxetine HCl (PAXIL PO) Take 10 mg by mouth daily.       . rizatriptan (MAXALT-MLT) 10 MG disintegrating tablet Take 1 tablet (10 mg total) by mouth as needed for migraine. May repeat in 2 hours if needed  15 tablet  6   Facility-Administered Medications Prior to Visit  Medication Dose Route Frequency Provider Last Rate Last Dose  . methylPREDNISolone sodium succinate (SOLU-MEDROL) 125 mg in sodium chloride 0.9 % 100 mL IVPB  125 mg Intravenous Continuous Levert FeinsteinYijun Yan, MD   125 mg at 04/20/13 1040  . valproate (DEPACON) 1,000 mg in sodium chloride 0.9 % 100 mL IVPB  1,000 mg Intravenous Continuous Levert FeinsteinYijun Yan, MD 110 mL/hr at 04/20/13 0945 1,000 mg at 04/20/13 0945     PHYSICAL EXAM  Filed Vitals:   09/01/13 1000  BP: 116/81  Pulse: 67  Height: 5\' 4"  (1.626 m)  Weight: 164 lb (74.39 kg)   Body mass index is 28.14 kg/(m^2).  Generalized: Well developed, in no acute distress  Head: normocephalic and atraumatic. Oropharynx benign  Neck: Supple, no carotid bruits  Cardiac: Regular rate rhythm, no murmur  Musculoskeletal: No deformity   Neurological examination  Mentation: Alert oriented to time, place, history taking. Follows all commands  speech and language fluent Cranial nerve II-XII: Fundoscopic exam reveals sharp disc margins. Pupils were equal round reactive to light extraocular movements were full, visual field were full on confrontational test. Facial sensation and strength were normal. hearing was intact to finger rubbing bilaterally. Uvula tongue midline. head turning and shoulder shrug and were normal and symmetric.Tongue protrusion into cheek  strength was normal. Motor: The motor testing reveals 5 over 5 strength of all 4 extremities. Good symmetric motor tone is noted throughout.  Sensory: Sensory testing is intact to pinprick, soft touch, vibration sensation, and position sense on all 4 extremities. No evidence of extinction is noted.  Coordination: Cerebellar testing reveals good finger-nose-finger and heel-to-shin bilaterally.  Gait and station: Gait is normal. Tandem gait is normal. Romberg is negative. No drift is seen.  Reflexes: Deep tendon reflexes are symmetric and normal bilaterally. Toes are downgoing bilaterally.    ASSESSMENT AND PLAN 23 years old female, with previous history of migraine headaches, now with frequent migraine, normal neurological examinations. Exacerbated by abrupt stop of SSRI, depression and anxiety now worse.  Start Lexapro 10 mg daily for anxiety. Needs to get established with new PCP Continue nortriptyline as preventive medication.  Maxalt as needed per acute Migriane Return to clinic with NP in 2 months.  Meds ordered this encounter  Medications  . escitalopram (LEXAPRO) 10 MG tablet    Sig: Take 1 tablet (10 mg total) by mouth daily.    Dispense:  30 tablet    Refill:  2    Order Specific Question:  Supervising Provider    Answer:  Levert FeinsteinYAN, YIJUN [[id number]]   Return in about 2 months (around 11/01/2013).  Ronal FearLYNN E. LAM, MSN, NP-C 09/01/2013, 6:22 PM [facility] [address], [address] [place], [place] [id number] [phone]  Note: This document was prepared with digital dictation and possible smart phrase technology. Any transcriptional errors that result from this process are unintentional.

## 2013-09-01 NOTE — Patient Instructions (Signed)
Start Lexapro 10 mg daily for anxiety. Continue nortriptyline as preventive medications for headache..  Maxalt as needed per acute Migriane Return to clinic with NP in 2 months.

## 2013-12-01 ENCOUNTER — Ambulatory Visit: Payer: No Typology Code available for payment source | Admitting: Nurse Practitioner

## 2013-12-03 ENCOUNTER — Other Ambulatory Visit: Payer: Self-pay | Admitting: Nurse Practitioner

## 2013-12-03 ENCOUNTER — Ambulatory Visit: Payer: No Typology Code available for payment source | Admitting: Nurse Practitioner

## 2013-12-04 ENCOUNTER — Telehealth: Payer: Self-pay | Admitting: Nurse Practitioner

## 2013-12-04 NOTE — Telephone Encounter (Signed)
Patient was no show for today's office appointment.  

## 2013-12-06 ENCOUNTER — Other Ambulatory Visit: Payer: Self-pay | Admitting: Nurse Practitioner

## 2013-12-06 NOTE — Telephone Encounter (Signed)
Patient has an appt scheduled this month

## 2013-12-13 ENCOUNTER — Ambulatory Visit (INDEPENDENT_AMBULATORY_CARE_PROVIDER_SITE_OTHER): Payer: No Typology Code available for payment source | Admitting: Nurse Practitioner

## 2013-12-13 ENCOUNTER — Encounter: Payer: Self-pay | Admitting: Nurse Practitioner

## 2013-12-13 VITALS — BP 111/67 | HR 55 | Ht 65.75 in | Wt 170.6 lb

## 2013-12-13 DIAGNOSIS — F411 Generalized anxiety disorder: Secondary | ICD-10-CM | POA: Insufficient documentation

## 2013-12-13 DIAGNOSIS — G43909 Migraine, unspecified, not intractable, without status migrainosus: Secondary | ICD-10-CM

## 2013-12-13 MED ORDER — NORTRIPTYLINE HCL 10 MG PO CAPS: ORAL_CAPSULE | ORAL | Status: DC

## 2013-12-13 MED ORDER — ESCITALOPRAM OXALATE 10 MG PO TABS: 10.0000 mg | ORAL_TABLET | Freq: Every day | ORAL | Status: DC

## 2013-12-13 NOTE — Patient Instructions (Signed)
Continue Lexapro 10 mg daily for anxiety.   Continue nortriptyline as preventive medication.  Maxalt as needed per acute Migraine if needed. Return to clinic  in 6 months, sooner as needed.

## 2013-12-13 NOTE — Progress Notes (Signed)
PATIENT: Tracy SoberMary F Cohen DOB: [birth year]  REASON FOR VISIT: routine follow up for Migaine HISTORY FROM: patient  HISTORY OF PRESENT ILLNESS: 04/20/13 (YY): Tracy Cohen is a 23 years old right-handed Caucasian female, referred by emergency room for evaluation of migraine headaches  She had episodes of migraine headaches when she was younger, in her teens, but only rarely happen, over the past 5 years, she began to have frequent headaches, about twice a month, she complains lightheaded, dizziness, nausea, with her headaches, relieved by lying darkened quiet room.  She had diarrhea, nausea December fourth, went to the emergency room, symptoms improved after IV fluid, woke up December 9th 2014, noticed severe pounding headaches, holocranial, with associated light noise sensitivity, nauseous, vomitting, she presented to the emergency room again, was given IV cocktail of Compazine, Decadron, Toradol, only with temporary relief of her headaches, She came in today, complains of persistent headaches since December ninth, nausea or vomiting, difficulty eating, described 12 out of 10 headache today, was associated light sensitivity, she denies visual change, denies lateralized motor or sensory deficit.   UPDATE 09/01/13 (LL): Tracy Cohen comes back in today, very tearful, states that her insurance changed and she could not see her previous PCP and her Paxil ran out last month. She states she is very anxious, cries a lot, but denies any suicidal ideation. 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 She is taking Nortriptyline 20 mg each night, which has helped headache somewhat at first, but she states since out of antidepressant, headaches are much worse.   UPDATE 12/13/13 (LL): Since last visit Tracy Cohen has done well on Lexapro, no longer feeling sad or tearful. Continues to take nortriptyline 20 mg each night as well. She states she still wakes up frequently at night. She has not had any migraines in the last 6 months. She  has not used Maxalt since December. She is very pleased with current treatment. Has had 2 instances of dizziness in the last month where she felt suddenly hot and lightheaded. She states that these episodes occurred in relaxed situations. She denies feeling imbalance or blacking out. She denies a feeling of movement or the room spinning. She described the episode as feeling like a hot flash must feel like.   REVIEW OF SYSTEMS: Full 14 system review of systems performed and notable only for Dizziness, frequent waking, anxiety  ALLERGIES: No Known Allergies  HOME MEDICATIONS: Outpatient Prescriptions Prior to Visit  Medication Sig Dispense Refill  . etonogestrel (IMPLANON) 68 MG IMPL implant Inject 1 each into the skin once. Placed in Jan 2014      . escitalopram (LEXAPRO) 10 MG tablet TAKE 1 TABLET BY MOUTH DAILY.  30 tablet  0  . nortriptyline (PAMELOR) 10 MG capsule One po qhs xone week, then 2 tabs po qhs  60 capsule  12  . escitalopram (LEXAPRO) 10 MG tablet TAKE 1 TABLET BY MOUTH DAILY.  30 tablet  0   Facility-Administered Medications Prior to Visit  Medication Dose Route Frequency Provider Last Rate Last Dose  . methylPREDNISolone sodium succinate (SOLU-MEDROL) 125 mg in sodium chloride 0.9 % 100 mL IVPB  125 mg Intravenous Continuous Levert FeinsteinYijun Yan, MD   125 mg at 04/20/13 1040  . valproate (DEPACON) 1,000 mg in sodium chloride 0.9 % 100 mL IVPB  1,000 mg Intravenous Continuous Levert FeinsteinYijun Yan, MD 110 mL/hr at 04/20/13 0945 1,000 mg at 04/20/13 0945    PHYSICAL EXAM Filed Vitals:   12/13/13 1320  BP: 111/67  Pulse: 55  Height: 5' 5.75" (1.67 m)  Weight: 170 lb 9.6 oz (77.384 kg)   Body mass index is 27.75 kg/(m^2).  Generalized: Well developed, in no acute distress  Head: normocephalic and atraumatic. Oropharynx benign  Neck: Supple, no carotid bruits  Cardiac: Regular rate rhythm, no murmur  Musculoskeletal: No deformity   Neurological examination  Mentation: Alert oriented to  time, place, history taking. Follows all commands speech and language fluent  Cranial nerve II-XII: Fundoscopic exam reveals sharp disc margins. Pupils were equal round reactive to light extraocular movements were full, visual field were full on confrontational test. Facial sensation and strength were normal. hearing was intact to finger rubbing bilaterally. Uvula tongue midline. head turning and shoulder shrug and were normal and symmetric.Tongue protrusion into cheek strength was normal.  Motor: The motor testing reveals 5 over 5 strength of all 4 extremities. Good symmetric motor tone is noted throughout.  Sensory: Sensory testing is intact to pinprick, soft touch, vibration sensation, and position sense on all 4 extremities. No evidence of extinction is noted.  Coordination: Cerebellar testing reveals good finger-nose-finger and heel-to-shin bilaterally.  Gait and station: Gait is normal. Tandem gait is normal. Romberg is negative. No drift is seen.  Reflexes: Deep tendon reflexes are symmetric and normal bilaterally. Toes are downgoing bilaterally.    ASSESSMENT: 23 year old AA female, with previous history of migraine headaches, normal neurological examinations. Exacerbated earlier this year by abrupt stop of SSRI, depression and anxiety were worse, now better on Lexapro. Migraines are under good control with Lexapro and Nortriptyline at night.  PLAN: Continue Lexapro 10 mg daily for anxiety.  Continue nortriptyline as preventive medication.  Maxalt as needed per acute Migraine  Return to clinic with NP in 6 months, sooner as needed..   Meds ordered this encounter  Medications  . escitalopram (LEXAPRO) 10 MG tablet    Sig: Take 1 tablet (10 mg total) by mouth daily.    Dispense:  90 tablet    Refill:  2    Order Specific Question:  Supervising Provider    Answer:  Joycelyn Schmid R [[id number]]  . nortriptyline (PAMELOR) 10 MG capsule    Sig: 2 tabs po qhs    Dispense:  180 capsule     Refill:  2    Order Specific Question:  Supervising Provider    Answer:  Joycelyn Schmid R [[id number]]   Return in about 6 months (around 06/15/2014) for Migraine, anxiety.  Tawny Asal LAM, MSN, FNP-BC, A/GNP-C 12/13/2013, 2:14 PM [facility] [address], [address] [place], [place] [address] [phone]  Note: This document was prepared with digital dictation and possible smart phrase technology. Any transcriptional errors that result from this process are unintentional.

## 2014-05-16 ENCOUNTER — Telehealth: Payer: Self-pay | Admitting: Neurology

## 2014-05-16 MED ORDER — ZOLMITRIPTAN 5 MG PO TABS: 5.0000 mg | ORAL_TABLET | ORAL | Status: DC | PRN

## 2014-05-16 NOTE — Telephone Encounter (Signed)
Patient called back and stated she would like to try injectable.  Please call and advise.

## 2014-05-16 NOTE — Telephone Encounter (Signed)
Called and spoke to patient states Dr.Yan is gone for the day and she will return 05-17-14. Patient understood and she was fine to wait.

## 2014-05-16 NOTE — Telephone Encounter (Signed)
I have called her, last visit with Lyn August 2015 she has daily migraine for 3 weeks now, previously tried Imitrex, Maxalt, no longer working, currently taking Relpax as needed, limited help, does not want injection  I have called in zomig 5 mg as needed Annabelle HarmanDana: Give her return visit in my next available

## 2014-05-16 NOTE — Telephone Encounter (Signed)
Patient requesting stronger Rx for Migraines.  Patient stated Rx nortriptyline (PAMELOR) 10 MG capsule and escitalopram (LEXAPRO) 10 MG tablet hasn't helped with migraines.  Patient has migraine today and she's at work.  Please forward medication to [facility].  Please and advise.

## 2014-05-17 ENCOUNTER — Telehealth: Payer: Self-pay

## 2014-05-17 ENCOUNTER — Telehealth: Payer: Self-pay | Admitting: Neurology

## 2014-05-17 MED ORDER — SUMATRIPTAN SUCCINATE 6 MG/0.5ML ~~LOC~~ SOLN: 6.0000 mg | SUBCUTANEOUS | Status: DC | PRN

## 2014-05-17 NOTE — Telephone Encounter (Signed)
I called the pharmacy.  They said this was the cash price because the patient has not brought in an updated ins card.  I called the patient back, got no answer.  Left message advising they need updated ins to give her a valid co-pay, or she may go online to [email or web address] to compare prices and obtain discount vouchers.

## 2014-05-17 NOTE — Telephone Encounter (Signed)
Pt is calling to see if we have any coupons for SUMAtriptan (IMITREX) 6 MG/0.5ML SOLN injection, it cost $300 and the pt states she can not afford that please call and advise.

## 2014-05-17 NOTE — Telephone Encounter (Signed)
I have called and left message for her to call back to discuss

## 2014-05-17 NOTE — Telephone Encounter (Signed)
I called the patient back to advise.  Got no answer.  Left message.  

## 2014-05-17 NOTE — Telephone Encounter (Signed)
Pt is calling back stating she is at work and can't take calls, but she would like for you to call in the injections to her pharmacy because the pills are not working.

## 2014-05-17 NOTE — Telephone Encounter (Signed)
Tracy Cohen at 05/17/2014 9:15 AM     Status: Signed       Expand All Collapse All   Pt is calling back stating she is at work and can't take calls, but she would like for you to call in the injections to her pharmacy because the pills are not working.      Please advise.  Thank you.

## 2014-05-17 NOTE — Telephone Encounter (Signed)
Shanda BumpsJessica, I have prescribed Imitrex, please call in to her pharmacy

## 2014-08-01 ENCOUNTER — Telehealth: Payer: Self-pay | Admitting: Neurology

## 2014-08-01 NOTE — Telephone Encounter (Signed)
Scheduled patient appt to be seen - her employer will be faxing over FMLA paperwork after she has been seen (they want the most recent visit).

## 2014-08-01 NOTE — Telephone Encounter (Signed)
Pt is calling stating she has to give her job a start date for her FMLA paperwork. She needs a start date that the physician would want her to start.  Please advise.

## 2014-08-08 ENCOUNTER — Ambulatory Visit (INDEPENDENT_AMBULATORY_CARE_PROVIDER_SITE_OTHER): Payer: BLUE CROSS/BLUE SHIELD | Admitting: Neurology

## 2014-08-08 ENCOUNTER — Encounter: Payer: Self-pay | Admitting: Neurology

## 2014-08-08 VITALS — BP 105/68 | HR 66 | Ht 65.75 in | Wt 172.0 lb

## 2014-08-08 DIAGNOSIS — G43009 Migraine without aura, not intractable, without status migrainosus: Secondary | ICD-10-CM

## 2014-08-08 DIAGNOSIS — F411 Generalized anxiety disorder: Secondary | ICD-10-CM

## 2014-08-08 MED ORDER — TOPIRAMATE 100 MG PO TABS: ORAL_TABLET | ORAL | Status: DC

## 2014-08-08 NOTE — Progress Notes (Signed)
PATIENT: Tracy Cohen DOB: [birth year]  REASON FOR VISIT: routine follow up for Migaine HISTORY OF PRESENT ILLNESS: Initial evaluation:04/20/13: [name] is a 24 years old right-handed Caucasian female, referred by emergency room for evaluation of migraine headaches   She had episodes of migraine headaches when she was younger, in her teens, but only rarely happen, over the past 5 years, she began to have frequent headaches, about twice a month, she complains lightheaded, dizziness, nausea, with her headaches, relieved by lying darkened quiet room.  She had diarrhea, nausea December fourth, went to the emergency room, symptoms improved after IV fluid, woke up December 9th 2014, noticed severe pounding headaches, holocranial, with associated light noise sensitivity, nauseous, vomitting, she presented to the emergency room again, was given IV cocktail of Compazine, Decadron, Toradol, only with temporary relief of her headaches, She came in today, complains of persistent headaches since December ninth, nausea or vomiting, difficulty eating, described 12 out of 10 headache today, was associated light sensitivity, she denies visual change, denies lateralized motor or sensory deficit.   UPDATE 09/01/13 (LL): [name] comes back in today, very tearful, states that her insurance changed and she could not see her previous PCP and her Paxil ran out last month. She states she is very anxious, cries a lot, but denies any suicidal ideation. 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 She is taking Nortriptyline 20 mg each night, which has helped headache somewhat at first, but she states since out of antidepressant, headaches are much worse.   UPDATE April 4th 2016; Last visit was with nurse practitioner Larita Fife, she complains of increased migraine since Jan 2016, 3-4 times a week, missing work, has stopped her SSRI, not helping her anyway She is now taking Zomig as needed which help her migraine somewhat, but not  persistently, her headache usually lasted 3-4 days, bilateral temporal region, severe pounding headaches with associated light noise sensitivity, nauseous, Imtrex Maxalt, does not help her headaches   REVIEW OF SYSTEMS: Full 14 system review of systems performed and notable only for Dizziness, frequent waking, anxiety  ALLERGIES: No Known Allergies  HOME MEDICATIONS: Outpatient Prescriptions Prior to Visit  Medication Sig Dispense Refill  . etonogestrel (IMPLANON) 68 MG IMPL implant Inject 1 each into the skin once. Placed in Jan 2014    . nortriptyline (PAMELOR) 10 MG capsule 2 tabs po qhs 180 capsule 2  . SUMAtriptan (IMITREX) 6 MG/0.5ML SOLN injection Inject 0.5 mLs (6 mg total) into the skin every 2 (two) hours as needed for migraine or headache. May repeat in 2 hours if headache persists or recurs. 5 mL 6  . zolmitriptan (ZOMIG) 5 MG tablet Take 1 tablet (5 mg total) by mouth as needed for migraine. 10 tablet 6  . escitalopram (LEXAPRO) 10 MG tablet Take 1 tablet (10 mg total) by mouth daily. 90 tablet 2  . methylPREDNISolone sodium succinate (SOLU-MEDROL) 125 mg in sodium chloride 0.9 % 100 mL IVPB     . valproate (DEPACON) 1,000 mg in sodium chloride 0.9 % 100 mL IVPB      No facility-administered medications prior to visit.    PHYSICAL EXAM Filed Vitals:   08/08/14 0944  BP: 105/68  Pulse: 66  Height: 5' 5.75" (1.67 m)  Weight: 172 lb (78.019 kg)   Body mass index is 27.97 kg/(m^2). PHYSICAL EXAMNIATION:  Gen: NAD, conversant, well nourised, obese, well groomed  Cardiovascular: Regular rate rhythm, no peripheral edema, warm, nontender. Eyes: Conjunctivae clear without exudates or hemorrhage Neck: Supple, no carotid bruise. Pulmonary: Clear to auscultation bilaterally   NEUROLOGICAL EXAM:  MENTAL STATUS: Speech:    Speech is normal; fluent and spontaneous with normal comprehension.  Cognition:    The patient is oriented to person, place, and  time;     recent and remote memory intact;     language fluent;     normal attention, concentration,     fund of knowledge.  CRANIAL NERVES: CN II: Visual fields are full to confrontation. Fundoscopic exam is normal with sharp discs and no vascular changes. Venous pulsations are present bilaterally. Pupils are 4 mm and briskly reactive to light. Visual acuity is 20/20 bilaterally. CN III, IV, VI: extraocular movement are normal. No ptosis. CN V: Facial sensation is intact to pinprick in all 3 divisions bilaterally. Corneal responses are intact.  CN VII: Face is symmetric with normal eye closure and smile. CN VIII: Hearing is normal to rubbing fingers CN IX, X: Palate elevates symmetrically. Phonation is normal. CN XI: Head turning and shoulder shrug are intact CN XII: Tongue is midline with normal movements and no atrophy.  MOTOR: There is no pronator drift of out-stretched arms. Muscle bulk and tone are normal. Muscle strength is normal.   Shoulder abduction Shoulder external rotation Elbow flexion Elbow extension Wrist flexion Wrist extension Finger abduction Hip flexion Knee flexion Knee extension Ankle dorsi flexion Ankle plantar flexion  R 5 5 5 5 5 5 5 5 5 5 5 5   L 5 5 5 5 5 5 5 5 5 5 5 5     REFLEXES: Reflexes are 2+ and symmetric at the biceps, triceps, knees, and ankles. Plantar responses are flexor.  SENSORY: Light touch, pinprick, position sense, and vibration sense are intact in fingers and toes.  COORDINATION: Rapid alternating movements and fine finger movements are intact. There is no dysmetria on finger-to-nose and heel-knee-shin. There are no abnormal or extraneous movements.   GAIT/STANCE: Posture is normal. Gait is steady with normal steps, base, arm swing, and turning. Heel and toe walking are normal. Tandem gait is normal.  Romberg is absent.  ASSESSMENT: 24 year old AA female, with history of migraine headaches, normal neurological examinations. Exacerbated  earlier this year by abrupt stop of SSRI, also with comorbidity of depression and anxiety, she continue have frequent headaches,  PLAN: Continue nortriptyline 20 mg every night as preventive medication. Add on Topamax 100 mg twice a day Zomig, Excedrin Migraine as needed for acute Migraine  Return to clinic with Eber Jonesarolyn in 3 months   Levert FeinsteinYijun Zaharah Amir, M.D. Ph.D.  [facility] [address] [place], [place] [address] Phone: [phone] Fax:      [phone]

## 2014-08-09 ENCOUNTER — Telehealth: Payer: Self-pay | Admitting: Neurology

## 2014-08-09 ENCOUNTER — Ambulatory Visit: Payer: Self-pay | Admitting: Neurology

## 2014-08-09 NOTE — Telephone Encounter (Signed)
She took topiramate 50mg  last night and another 50mg  this morning - symptoms started after her second dose - says the side effects are intolerable.  She has not taken any further doses and she is feeling better.  She would like to try an alternate medication.

## 2014-08-09 NOTE — Telephone Encounter (Signed)
Patient calling to state that the medication Topamax is giving her side effects of dry mouth, throat swelling, rapid heartbeat. Please call patient at [phone].

## 2014-08-10 NOTE — Telephone Encounter (Signed)
She verbalized understanding and will follow the new titration plan.  She will call back with any further concerns.

## 2014-08-10 NOTE — Telephone Encounter (Signed)
Marcelino DusterMichelle, please call patient, she may try Topamax longer, titrating up the dosage slowly, she is now having 100 mg tablets, she may take 50 mg every night for one week, then gradually add on 50 mg each week as her body tolerate,

## 2014-08-22 ENCOUNTER — Ambulatory Visit: Payer: Self-pay | Admitting: Neurology

## 2014-11-10 ENCOUNTER — Ambulatory Visit (INDEPENDENT_AMBULATORY_CARE_PROVIDER_SITE_OTHER): Payer: 59 | Admitting: Nurse Practitioner

## 2014-11-10 ENCOUNTER — Telehealth: Payer: Self-pay | Admitting: *Deleted

## 2014-11-10 ENCOUNTER — Encounter: Payer: Self-pay | Admitting: Nurse Practitioner

## 2014-11-10 VITALS — BP 104/60 | HR 79 | Ht 65.0 in | Wt 175.2 lb

## 2014-11-10 DIAGNOSIS — F411 Generalized anxiety disorder: Secondary | ICD-10-CM | POA: Diagnosis not present

## 2014-11-10 DIAGNOSIS — G43909 Migraine, unspecified, not intractable, without status migrainosus: Secondary | ICD-10-CM | POA: Diagnosis not present

## 2014-11-10 NOTE — Telephone Encounter (Signed)
Patient records on Tracy Cohen desk.

## 2014-11-10 NOTE — Telephone Encounter (Signed)
Patient form was faxed to [phone].

## 2014-11-10 NOTE — Patient Instructions (Signed)
Continue Topamax at current doses does not need refills Continue Zomig acutely Dr. Emerson MonteParrish McKinney, [phone] Dr. Evelene CroonKaur phone number [phone] Follow-up in 6 months

## 2014-11-10 NOTE — Telephone Encounter (Signed)
FMLA  (to [employer]).

## 2014-11-10 NOTE — Progress Notes (Signed)
[facility]  PATIENT: Tracy Cohen DOB: Mar 18, 1991   REASON FOR VISIT: Follow-up for migraine headaches, anxiety Disorder HISTORY FROM: Patient    HISTORY OF PRESENT ILLNESS:Initial evaluation:04/20/13: Tracy Cohen is a 24 years old right-handed Caucasian female, referred by emergency room for evaluation of migraine headaches  She had episodes of migraine headaches when she was younger, in her teens, but only rarely happen, over the past 5 years, she began to have frequent headaches, about twice a month, she complains lightheaded, dizziness, nausea, with her headaches, relieved by lying darkened quiet room.  She had diarrhea, nausea December fourth, went to the emergency room, symptoms improved after IV fluid, woke up December 9th 2014, noticed severe pounding headaches, holocranial, with associated light noise sensitivity, nauseous, vomitting, she presented to the emergency room again, was given IV cocktail of Compazine, Decadron, Toradol, only with temporary relief of her headaches, She came in today, complains of persistent headaches since December ninth, nausea or vomiting, difficulty eating, described 12 out of 10 headache today, was associated light sensitivity, she denies visual change, denies lateralized motor or sensory deficit.  UPDATE April 4th 2016;Last visit was with nurse practitioner Larita Fife, she complains of increased migraine since Jan 2016, 3-4 times a week, missing work, has stopped her SSRI, not helping her anywayShe is now taking Zomig as needed which help her migraine somewhat, but not persistently, her headache usually lasted 3-4 days, bilateral temporal region, severe pounding headaches with associated light noise sensitivity, nauseous,Imtrex Maxalt, does not help her headaches UPDATE 11/10/2014 Tracy Cohen, 24 year old female returns for follow-up. She was last seen in the office by Dr. Terrace Arabia 08/08/2014 and was placed on Topamax at that time. She was having 3-4  headaches a week and now she is having 1 headache per month which last approximately 2 hours. Zomig works acutely when she takes the medication promptly. She continues to be very anxious, cries a lot, but denies any suicidal ideation. 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 She has FMLA paperwork to fill out today. She is asking about referral to psychiatry for her anxiety disorder. She returns for reevaluation   REVIEW OF SYSTEMS: Full 14 system review of systems performed and notable only for those listed, all others are neg:  Constitutional: neg  Cardiovascular: neg Ear/Nose/Throat: neg  Skin: neg Eyes: Light sensitivity Respiratory: neg Gastroitestinal: neg  Hematology/Lymphatic: neg  Endocrine: neg Musculoskeletal:neg Allergy/Immunology: neg Neurological: Migraine headaches Psychiatric: Anxiety Sleep : Frequent awakening   ALLERGIES: No Known Allergies  HOME MEDICATIONS: Outpatient Prescriptions Prior to Visit  Medication Sig Dispense Refill  . etonogestrel (IMPLANON) 68 MG IMPL implant Inject 1 each into the skin once. Placed in Jan 2014    . topiramate (TOPAMAX) 100 MG tablet 1/2 tab po bid xone week, then one po bid (Patient taking differently: Take 100 mg by mouth 2 (two) times daily. 1/2 tab po bid xone week, then one po bid) 60 tablet 11  . zolmitriptan (ZOMIG) 5 MG tablet Take 1 tablet (5 mg total) by mouth as needed for migraine. 10 tablet 6  . nortriptyline (PAMELOR) 10 MG capsule 2 tabs po qhs (Patient not taking: Reported on 11/10/2014) 180 capsule 2  . SUMAtriptan (IMITREX) 6 MG/0.5ML SOLN injection Inject 0.5 mLs (6 mg total) into the skin every 2 (two) hours as needed for migraine or headache. May repeat in 2 hours if headache persists or recurs. 5 mL 6   No facility-administered medications prior to visit.  PAST MEDICAL HISTORY: Past Medical History  Diagnosis Date  . Migraine   . Headache   . Weakness   . Dizziness   . Anxiety     PAST  SURGICAL HISTORY: Past Surgical History  Procedure Laterality Date  . Tonsillectomy    . Adenoidectomy      FAMILY HISTORY: Family History  Problem Relation Age of Onset  . High blood pressure Mother   .       SOCIAL HISTORY: History   Social History  . Marital Status: Single    Spouse Name: N/A  . Number of Children: 0  . Years of Education: 13   Occupational History  .      [employer]   Social History Main Topics  . Smoking status: Never Smoker   . Smokeless tobacco: Never Used  . Alcohol Use: Yes  . Drug Use: No  . Sexual Activity: Not on file   Other Topics Concern  . Not on file   Social History Narrative   Patient lives at home alone.    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   Right handed.           PHYSICAL EXAM  Filed Vitals:   11/10/14 0800  BP: 104/60  Pulse: 79  Height: 5\' 5"  (1.651 m)  Weight: 175 lb 3.2 oz (79.47 kg)   Body mass index is 29.15 kg/(m^2).  Generalized: Well developed, mildly obese female in no acute distress  Head: normocephalic and atraumatic,. Oropharynx benign  Neck: Supple, no carotid bruits  Musculoskeletal: No deformity   Neurological examination   Mentation: Alert oriented to time, place, history taking. Attention span and concentration appropriate. Recent and remote memory intact.  Follows all commands speech and language fluent.   Cranial nerve II-XII: Fundoscopic exam reveals sharp disc margins.Pupils were equal round reactive to light extraocular movements were full, visual field were full on confrontational test. Facial sensation and strength were normal. hearing was intact to finger rubbing bilaterally. Uvula tongue midline. head turning and shoulder shrug were normal and symmetric.Tongue protrusion into cheek strength was normal. Motor: normal bulk and tone, full strength in the BUE, BLE, fine finger movements normal, no pronator drift. No focal  weakness Coordination: finger-nose-finger, heel-to-shin bilaterally, no dysmetria Reflexes: Brachioradialis 2/2, biceps 2/2, triceps 2/2, patellar 2/2, Achilles 2/2, plantar responses were flexor bilaterally. Gait and Station: Rising up from seated position without assistance, normal stance,  moderate stride, good arm swing, smooth turning, able to perform tiptoe, and heel walking without difficulty. Tandem gait is steady  DIAGNOSTIC DATA (LABS, IMAGING, TESTING) -  ASSESSMENT AND PLAN  24 y.o. year old female  has a past medical history of Migraine; Headache; Anxiety disorder with normal neurologic exam here to follow-up. She was placed on Topamax at her last visit and her headaches have gone from 3-4 times a week to once monthly. She is wanting referral to psychiatry for her anxiety disorder. The patient is a current patient of Dr. Terrace Arabia  who is out of the office today . This note is sent to the work in doctor.     Continue Topamax at current doses does not need refills Continue Zomig acutely She will need to check with her insurance to see about mental health coverage.  Private psychiatrist which have counseling services in our office are Dr. Emerson Monte, [phone] Dr. Evelene Croon phone number [phone] If your insurance does not cover mental health  you can go to the mental health  clinic [place] on [address].  Follow-up in 6 months Nilda RiggsNancy Carolyn Tishara Pizano, [facility] HospitalGNP, [facility]BC, APRN  [facility] [address], [address] [place], [place] [address] [phone]

## 2014-11-11 DIAGNOSIS — Z0289 Encounter for other administrative examinations: Secondary | ICD-10-CM

## 2014-11-11 NOTE — Progress Notes (Signed)
I agree with the above plan 

## 2014-12-20 ENCOUNTER — Emergency Department (HOSPITAL_COMMUNITY)
Admission: EM | Admit: 2014-12-20 | Discharge: 2014-12-20 | Discharge status: Against Medical Advice | Payer: 59 | Attending: Emergency Medicine | Admitting: Emergency Medicine

## 2014-12-20 ENCOUNTER — Encounter (HOSPITAL_COMMUNITY): Payer: Self-pay | Admitting: *Deleted

## 2014-12-20 DIAGNOSIS — R1110 Vomiting, unspecified: Secondary | ICD-10-CM | POA: Insufficient documentation

## 2014-12-20 DIAGNOSIS — R109 Unspecified abdominal pain: Secondary | ICD-10-CM | POA: Insufficient documentation

## 2014-12-20 MED ORDER — ONDANSETRON 4 MG PO TBDP: 4.0000 mg | ORAL_TABLET | Freq: Once | ORAL | Status: DC | PRN

## 2014-12-20 NOTE — ED Notes (Signed)
Pt was upset due to wait in triage room per [facility] that pt spoke with.

## 2014-12-20 NOTE — ED Notes (Signed)
I called patient name to collect labs and no one responded 

## 2014-12-20 NOTE — ED Notes (Signed)
Pt complains of abdominal pain, emesis since 12AM last night. Pt denies diarrhea. Pt states her throat is burning from vomiting so much.

## 2014-12-20 NOTE — ED Notes (Signed)
Went to offer pt Zofran ODT for her n/v, pt left triage area and went towards lobby.

## 2015-04-14 ENCOUNTER — Telehealth: Payer: Self-pay | Admitting: Neurology

## 2015-04-17 ENCOUNTER — Telehealth: Payer: Self-pay | Admitting: Neurology

## 2015-04-17 DIAGNOSIS — Z0289 Encounter for other administrative examinations: Secondary | ICD-10-CM

## 2015-04-17 NOTE — Telephone Encounter (Signed)
Patient called to advise FMLA form was to be faxed to our office Friday 04/14/15. Patient advised office closes at 12pm on Friday's, patient transferred to Billing to make form payment, patient would like to be called when we receive her form.

## 2015-04-17 NOTE — Telephone Encounter (Signed)
Pt called and would like a call back when nurse receives her FMLA form.

## 2015-04-21 ENCOUNTER — Telehealth: Payer: Self-pay | Admitting: *Deleted

## 2015-04-21 NOTE — Telephone Encounter (Signed)
Patient form fax on 04/21/15.

## 2015-05-16 ENCOUNTER — Ambulatory Visit: Payer: 59 | Admitting: Nurse Practitioner

## 2015-07-05 NOTE — Telephone Encounter (Signed)
error 

## 2015-07-10 ENCOUNTER — Encounter (HOSPITAL_COMMUNITY): Payer: Self-pay | Admitting: Emergency Medicine

## 2015-07-10 ENCOUNTER — Emergency Department (HOSPITAL_COMMUNITY)
Admission: EM | Admit: 2015-07-10 | Discharge: 2015-07-10 | Disposition: A | Discharge status: Home / Self Care | Payer: 59 | Attending: Emergency Medicine | Admitting: Emergency Medicine

## 2015-07-10 ENCOUNTER — Emergency Department (HOSPITAL_COMMUNITY)
Admission: EM | Admit: 2015-07-10 | Discharge: 2015-07-10 | Disposition: A | Discharge status: Home / Self Care | Payer: Self-pay | Source: Home / Self Care

## 2015-07-10 DIAGNOSIS — L299 Pruritus, unspecified: Secondary | ICD-10-CM | POA: Diagnosis present

## 2015-07-10 DIAGNOSIS — L989 Disorder of the skin and subcutaneous tissue, unspecified: Secondary | ICD-10-CM | POA: Diagnosis not present

## 2015-07-10 DIAGNOSIS — G43909 Migraine, unspecified, not intractable, without status migrainosus: Secondary | ICD-10-CM | POA: Diagnosis not present

## 2015-07-10 DIAGNOSIS — Z79899 Other long term (current) drug therapy: Secondary | ICD-10-CM | POA: Insufficient documentation

## 2015-07-10 DIAGNOSIS — F419 Anxiety disorder, unspecified: Secondary | ICD-10-CM | POA: Diagnosis not present

## 2015-07-10 MED ORDER — MUPIROCIN CALCIUM 2 % EX CREA: 1.0000 "application " | TOPICAL_CREAM | Freq: Two times a day (BID) | CUTANEOUS | Status: DC

## 2015-07-10 NOTE — Discharge Instructions (Signed)
bactroban topically twice a day. Follow up with your doctor as needed. Return if worsening.   Cellulitis Cellulitis is an infection of the skin and the tissue beneath it. The infected area is usually red and tender. Cellulitis occurs most often in the arms and lower legs.  CAUSES  Cellulitis is caused by bacteria that enter the skin through cracks or cuts in the skin. The most common types of bacteria that cause cellulitis are staphylococci and streptococci. SIGNS AND SYMPTOMS   Redness and warmth.  Swelling.  Tenderness or pain.  Fever. DIAGNOSIS  Your health care provider can usually determine what is wrong based on a physical exam. Blood tests may also be done. TREATMENT  Treatment usually involves taking an antibiotic medicine. HOME CARE INSTRUCTIONS   Take your antibiotic medicine as directed by your health care provider. Finish the antibiotic even if you start to feel better.  Keep the infected arm or leg elevated to reduce swelling.  Apply a warm cloth to the affected area up to 4 times per day to relieve pain.  Take medicines only as directed by your health care provider.  Keep all follow-up visits as directed by your health care provider. SEEK MEDICAL CARE IF:   You notice red streaks coming from the infected area.  Your red area gets larger or turns dark in color.  Your bone or joint underneath the infected area becomes painful after the skin has healed.  Your infection returns in the same area or another area.  You notice a swollen bump in the infected area.  You develop new symptoms.  You have a fever. SEEK IMMEDIATE MEDICAL CARE IF:   You feel very sleepy.  You develop vomiting or diarrhea.  You have a general ill feeling (malaise) with muscle aches and pains.   This information is not intended to replace advice given to you by your health care provider. Make sure you discuss any questions you have with your health care provider.   Document  Released: 01/30/2005 Document Revised: 01/11/2015 Document Reviewed: 07/08/2011 Elsevier Interactive Patient Education [employer]2016 Elsevier Inc.

## 2015-07-10 NOTE — ED Notes (Signed)
Patient presents for what she states is an insect bite to the lateral lower leg x2 weeks. She states it started out as what looked like a mosquito bite and later the top layer of skin fell off. Area is reddened and approximately 1/2" in diameter. Denies fever, N/V.

## 2015-07-10 NOTE — ED Provider Notes (Signed)
CSN: [id number]     Arrival date & time 07/10/15  1509 History  By signing my name below, I, Tracy Cohen, attest that this documentation has been prepared under the direction and in the presence of Proctor Carriker, PA-C. Electronically Signed: Randell PatientMarrissa Cohen, ED Scribe. 07/10/2015. 4:51 PM.   Chief Complaint  Patient presents with  . Insect Bite   The history is provided by the patient. No language interpreter was used.   HPI Comments: Tracy Cohen is a 25 y.o. female who presents to the Emergency Department complaining of pruritic, mildly painful insect bite 2 weeks ago. Patient reports that she believes she was bitten by a mosquito 2 weeks ago, followed by itching which scratched. She states that the skin under the scab changed color and that the skin area peeled off 3 days ago followed by pain she describes as tenderness. She has cleaned the area with rubbing alcohol and applied a bandage without relief. She denies drainage and bleeding from the area. States she is concerned because it is still no healed. No fever or chills or any systemic symptoms.   Past Medical History  Diagnosis Date  . Migraine   . Headache   . Weakness   . Dizziness   . Anxiety    Past Surgical History  Procedure Laterality Date  . Tonsillectomy    . Adenoidectomy     Family History  Problem Relation Age of Onset  . High blood pressure Mother   .      Social History  Substance Use Topics  . Smoking status: Never Smoker   . Smokeless tobacco: Never Used  . Alcohol Use: Yes   OB History    No data available     Review of Systems  Constitutional: Negative for fever and chills.  Skin: Positive for wound (insect bite to lateral lower leg).       Negative for drainage.      Allergies  Review of patient's allergies indicates no known allergies.  Home Medications   Prior to Admission medications   Medication Sig Start Date End Date Taking? Authorizing Provider  etonogestrel  (IMPLANON) 68 MG IMPL implant Inject 1 each into the skin once. Placed in Jan 2014    Historical Provider, MD  folic acid (FOLVITE) 1 MG tablet Take 1 mg by mouth daily. 12/08/14   Historical Provider, MD  hydrOXYzine (ATARAX/VISTARIL) 25 MG tablet Take 25 mg by mouth at bedtime. 12/02/14   Historical Provider, MD  naproxen sodium (ANAPROX) 220 MG tablet Take 440 mg by mouth every 12 (twelve) hours as needed (pain).    Historical Provider, MD  topiramate (TOPAMAX) 100 MG tablet 1/2 tab po bid xone week, then one po bid Patient taking differently: Take 100 mg by mouth 2 (two) times daily.  08/08/14   Levert FeinsteinYijun Yan, MD  zolmitriptan (ZOMIG) 5 MG tablet Take 1 tablet (5 mg total) by mouth as needed for migraine. Patient not taking: Reported on 12/20/2014 05/16/14   Levert FeinsteinYijun Yan, MD   BP 121/80 mmHg  Pulse 96  Temp(Src) 98.4 F (36.9 C) (Oral)  Resp 18  SpO2 100%  LMP 07/10/2015 Physical Exam  Constitutional: She is oriented to person, place, and time. She appears well-developed and well-nourished. No distress.  HENT:  Head: Normocephalic and atraumatic.  Eyes: Conjunctivae and EOM are normal.  Neck: Neck supple. No tracheal deviation present.  Cardiovascular: Normal rate.   Pulmonary/Chest: Effort normal. No respiratory distress.  Musculoskeletal: Normal range of motion.  Neurological: She is alert and oriented to person, place, and time.  Skin: Skin is warm and dry.  2x2cm area of erythema and scaling to the right lower anterior shin and surrounding scabbing. No induration. Not warm to the touch. No drainage. No tenderness to palpation.   Psychiatric: She has a normal mood and affect. Her behavior is normal.  Nursing note and vitals reviewed.   ED Course  Procedures   DIAGNOSTIC STUDIES: Oxygen Saturation is 100% on RA, normal by my interpretation.    COORDINATION OF CARE: 4:51 PM Will prescribe Bactroban. Discussed treatment plan with pt at bedside and pt agreed to plan.   Labs  Review Labs Reviewed - No data to display  Imaging Review No results found. I have personally reviewed and evaluated these images and lab results as part of my medical decision-making.   EKG Interpretation None      MDM   Final diagnoses:  Skin lesion of left leg   Patient with a scabbing wound to the right lower leg, with no surrounding signs of cellulitis. There is no evidence of underlying abscess or deep infection. No drainage. Possible insect or spider bite, but it appears to be healing well. Will order topical bactroban ointment. Follow up with primary care doctor as needed.   Filed Vitals:   07/10/15 1524 07/10/15 1719  BP: 121/80 132/83  Pulse: 96 90  Temp: 98.4 F (36.9 C) 97.9 F (36.6 C)  TempSrc: Oral Oral  Resp: 18   SpO2: 100% 98%   I personally performed the services described in this documentation, which was scribed in my presence. The recorded information has been reviewed and is accurate.   Tracy Crumble, PA-C 07/10/15 1837  Lorre Nick, MD 07/10/15 [phone]

## 2015-07-19 NOTE — Telephone Encounter (Signed)
Done

## 2015-08-06 ENCOUNTER — Inpatient Hospital Stay (HOSPITAL_COMMUNITY)
Admission: AD | Admit: 2015-08-06 | Discharge: 2015-08-07 | Disposition: A | Discharge status: Home / Self Care | Payer: 59 | Source: Ambulatory Visit | Attending: Obstetrics and Gynecology | Admitting: Obstetrics and Gynecology

## 2015-08-06 ENCOUNTER — Encounter (HOSPITAL_COMMUNITY): Payer: Self-pay | Admitting: *Deleted

## 2015-08-06 DIAGNOSIS — G43909 Migraine, unspecified, not intractable, without status migrainosus: Secondary | ICD-10-CM | POA: Insufficient documentation

## 2015-08-06 DIAGNOSIS — R531 Weakness: Secondary | ICD-10-CM | POA: Insufficient documentation

## 2015-08-06 DIAGNOSIS — R51 Headache: Secondary | ICD-10-CM | POA: Insufficient documentation

## 2015-08-06 DIAGNOSIS — Z9889 Other specified postprocedural states: Secondary | ICD-10-CM | POA: Insufficient documentation

## 2015-08-06 DIAGNOSIS — Z202 Contact with and (suspected) exposure to infections with a predominantly sexual mode of transmission: Secondary | ICD-10-CM | POA: Insufficient documentation

## 2015-08-06 DIAGNOSIS — R42 Dizziness and giddiness: Secondary | ICD-10-CM | POA: Insufficient documentation

## 2015-08-06 DIAGNOSIS — Z8249 Family history of ischemic heart disease and other diseases of the circulatory system: Secondary | ICD-10-CM | POA: Insufficient documentation

## 2015-08-06 DIAGNOSIS — F17200 Nicotine dependence, unspecified, uncomplicated: Secondary | ICD-10-CM | POA: Insufficient documentation

## 2015-08-06 DIAGNOSIS — M549 Dorsalgia, unspecified: Secondary | ICD-10-CM | POA: Insufficient documentation

## 2015-08-06 DIAGNOSIS — F419 Anxiety disorder, unspecified: Secondary | ICD-10-CM | POA: Insufficient documentation

## 2015-08-06 LAB — POCT PREGNANCY, URINE: PREG TEST UR: NEGATIVE

## 2015-08-06 LAB — WET PREP, GENITAL
CLUE CELLS WET PREP: NONE SEEN
SPERM: NONE SEEN
YEAST WET PREP: NONE SEEN

## 2015-08-06 NOTE — MAU Note (Signed)
Pt reports a shooting left lower back pain off/on x one month. Also recently had sex with a new partner and the condom broke. Pt denies symptoms but wants testing.

## 2015-08-06 NOTE — MAU Provider Note (Signed)
History    Cc: std testing, back pain x 1 month  HPI: 25 Yo [id number] [name] presents for std screening due to exposure to std( unknown to her). Pt had relations last Saturday with another person who told her today he had an STD. Pt then slept with her normal partner on Monday. Pt uses Implanon for birth control which was due to come out in February but is scheduled for removal April. Pt denies vaginal d/c. Pt c/o back pain on and off for over a month. Denies fever, abdominal or pelvic pain. No urinary sx   OB History    Gravida Para Term Preterm AB TAB SAB Ectopic Multiple Living   0      Past Medical History  Diagnosis Date  . Migraine   . Headache   . Weakness   . Dizziness   . Anxiety     Past Surgical History  Procedure Laterality Date  . Tonsillectomy    . Adenoidectomy      Family History  Problem Relation Age of Onset  . High blood pressure Mother   .       Social History  Substance Use Topics  . Smoking status: Current Every Day Smoker  . Smokeless tobacco: Never Used  . Alcohol Use: No    Allergies: No Known Allergies  Prescriptions prior to admission  Medication Sig Dispense Refill Last Dose  . FLUoxetine (PROZAC) 20 MG capsule Take 20 mg by mouth daily.   08/06/2015 at Unknown time  . folic acid (FOLVITE) 1 MG tablet Take 1 mg by mouth daily.  3 08/06/2015 at Unknown time  . mupirocin cream (BACTROBAN) 2 % Apply 1 application topically 2 (two) times daily. 15 g 0 Past Week at Unknown time  . etonogestrel (IMPLANON) 68 MG IMPL implant Inject 1 each into the skin once. Placed in Jan 2014   Taking  . hydrOXYzine (ATARAX/VISTARIL) 25 MG tablet Take 25 mg by mouth at bedtime.  12 More than a month at Unknown time  . naproxen sodium (ANAPROX) 220 MG tablet Take 440 mg by mouth every 12 (twelve) hours as needed (pain).   More than a month at Unknown time  . topiramate (TOPAMAX) 100 MG tablet 1/2 tab po bid xone week, then one po bid (Patient taking  differently: Take 100 mg by mouth 2 (two) times daily. ) 60 tablet 11 More than a month at Unknown time  . zolmitriptan (ZOMIG) 5 MG tablet Take 1 tablet (5 mg total) by mouth as needed for migraine. (Patient not taking: Reported on 12/20/2014) 10 tablet 6 More than a month at Unknown time     Physical Exam   Blood pressure 139/72, pulse 92, temperature 98.2 F (36.8 C), temperature source Oral, resp. rate 16, height  (1.651 m), weight 80.74 kg (178 lb), last menstrual period 06/29/2015, SpO2 100 %.  General appearance: alert, cooperative and tearful Back: no tenderness to percussion or palpation Lungs: clear to auscultation bilaterally Heart: regular rate and rhythm, S1, S2 normal, no murmur, click, rub or gallop Abdomen: soft, non-tender; bowel sounds normal; no masses,  no organomegaly Pelvic: cervix normal in appearance, external genitalia normal, no adnexal masses or tenderness, no bladder tenderness, no cervical motion tenderness, uterus normal size, shape, and consistency and vaginal d/c( white) Extremities: no edema, redness or tenderness in the calves or thighs    Labs: G/C done, wet prep done ED Course  STD exposure Back pain P) await wet prep result, UPT, urinalysis MDM   Tracy Cohen A, MD 11:32 PM 08/06/2015  wet-mount exam shows trichomonads.   Addendum: wet prep (+) trich. Treat with Flagyl 2g po. To call for culture results

## 2015-08-07 LAB — URINALYSIS, ROUTINE W REFLEX MICROSCOPIC
Bilirubin Urine: NEGATIVE
GLUCOSE, UA: NEGATIVE mg/dL
KETONES UR: NEGATIVE mg/dL
NITRITE: NEGATIVE
PROTEIN: NEGATIVE mg/dL
Specific Gravity, Urine: 1.02 (ref 1.005–1.030)
pH: 6 (ref 5.0–8.0)

## 2015-08-07 LAB — URINE MICROSCOPIC-ADD ON

## 2015-08-07 LAB — GC/CHLAMYDIA PROBE AMP (~~LOC~~) NOT AT ARMC
CHLAMYDIA, DNA PROBE: NEGATIVE
Neisseria Gonorrhea: NEGATIVE

## 2015-08-07 MED ORDER — METRONIDAZOLE 500 MG PO TABS
2000.0000 mg | ORAL_TABLET | Freq: Once | ORAL | Status: AC
  Administered 2015-08-07: 2000 mg via ORAL
  Filled 2015-08-07: qty 4

## 2015-08-07 NOTE — Discharge Instructions (Signed)
Trichomoniasis Trichomoniasis is an infection caused by an organism called Trichomonas. The infection can affect both women and men. In women, the outer female genitalia and the vagina are affected. In men, the penis is mainly affected, but the prostate and other reproductive organs can also be involved. Trichomoniasis is a sexually transmitted infection (STI) and is most often passed to another person through sexual contact.  RISK FACTORS  Having unprotected sexual intercourse.  Having sexual intercourse with an infected partner. SIGNS AND SYMPTOMS  Symptoms of trichomoniasis in women include:  Abnormal gray-green frothy vaginal discharge.  Itching and irritation of the vagina.  Itching and irritation of the area outside the vagina. Symptoms of trichomoniasis in men include:   Penile discharge with or without pain.  Pain during urination. This results from inflammation of the urethra. DIAGNOSIS  Trichomoniasis may be found during a Pap test or physical exam. Your health care provider may use one of the following methods to help diagnose this infection:  Testing the pH of the vagina with a test tape.  Using a vaginal swab test that checks for the Trichomonas organism. A test is available that provides results within a few minutes.  Examining a urine sample.  Testing vaginal secretions. Your health care provider may test you for other STIs, including HIV. TREATMENT   You may be given medicine to fight the infection. Women should inform their health care provider if they could be or are pregnant. Some medicines used to treat the infection should not be taken during pregnancy.  Your health care provider may recommend over-the-counter medicines or creams to decrease itching or irritation.  Your sexual partner will need to be treated if infected.  Your health care provider may test you for infection again 3 months after treatment. HOME CARE INSTRUCTIONS   Take medicines only as  directed by your health care provider.  Take over-the-counter medicine for itching or irritation as directed by your health care provider.  Do not have sexual intercourse while you have the infection.  Women should not douche or wear tampons while they have the infection.  Discuss your infection with your partner. Your partner may have gotten the infection from you, or you may have gotten it from your partner.  Have your sex partner get examined and treated if necessary.  Practice safe, informed, and protected sex.  See your health care provider for other STI testing. SEEK MEDICAL CARE IF:   You still have symptoms after you finish your medicine.  You develop abdominal pain.  You have pain when you urinate.  You have bleeding after sexual intercourse.  You develop a rash.  Your medicine makes you sick or makes you throw up (vomit). MAKE SURE YOU:  Understand these instructions.  Will watch your condition.  Will get help right away if you are not doing well or get worse.   This information is not intended to replace advice given to you by your health care provider. Make sure you discuss any questions you have with your health care provider.   Document Released: 10/16/2000 Document Revised: 05/13/2014 Document Reviewed: 02/01/2013 Elsevier Interactive Patient Education 2016 Elsevier Inc.  

## 2015-08-08 LAB — URINE CULTURE

## 2015-10-06 DIAGNOSIS — Z0289 Encounter for other administrative examinations: Secondary | ICD-10-CM

## 2015-10-11 ENCOUNTER — Telehealth: Payer: Self-pay | Admitting: Neurology

## 2015-10-11 NOTE — Telephone Encounter (Signed)
Patient called to check status of FMLA form that was "faxed last Monday or Tuesday". Patient advised of 14 day turn around time for forms. Patient wants to confirm that form was received. Please call [phone].

## 2015-10-13 NOTE — Telephone Encounter (Signed)
I spoke to pt, to let her know that we  have not received her fm la form. Pt will bring a copy by today before 12:00

## 2015-11-02 ENCOUNTER — Ambulatory Visit: Payer: 59 | Admitting: Neurology

## 2015-11-09 ENCOUNTER — Encounter: Payer: Self-pay | Admitting: Adult Health

## 2015-11-09 ENCOUNTER — Ambulatory Visit (INDEPENDENT_AMBULATORY_CARE_PROVIDER_SITE_OTHER): Payer: 59 | Admitting: Adult Health

## 2015-11-09 VITALS — BP 122/98 | HR 86 | Resp 20 | Ht 65.0 in | Wt 174.0 lb

## 2015-11-09 DIAGNOSIS — G43009 Migraine without aura, not intractable, without status migrainosus: Secondary | ICD-10-CM

## 2015-11-09 MED ORDER — SUMATRIPTAN SUCCINATE 50 MG PO TABS
ORAL_TABLET | ORAL | Status: DC
Start: 2015-11-09 — End: 2016-05-14

## 2015-11-09 NOTE — Progress Notes (Signed)
PATIENT: Tracy Cohen DOB: Dec 04, 1990  REASON FOR VISIT: follow up HISTORY FROM: patient  HISTORY OF PRESENT ILLNESS: HISTORY Terrace Arabia): Initial evaluation:04/20/13: Tracy Cohen is a 25 years old right-handed Caucasian female, referred by emergency room for evaluation of migraine headaches  She had episodes of migraine headaches when she was younger, in her teens, but only rarely happen, over the past 5 years, she began to have frequent headaches, about twice a month, she complains lightheaded, dizziness, nausea, with her headaches, relieved by lying darkened quiet room.  She had diarrhea, nausea December fourth, went to the emergency room, symptoms improved after IV fluid, woke up December 9th 2014, noticed severe pounding headaches, holocranial, with associated light noise sensitivity, nauseous, vomitting, she presented to the emergency room again, was given IV cocktail of Compazine, Decadron, Toradol, only with temporary relief of her headaches, She came in today, complains of persistent headaches since December ninth, nausea or vomiting, difficulty eating, described 12 out of 10 headache today, was associated light sensitivity, she denies visual change, denies lateralized motor or sensory deficit.  UPDATE April 4th 2016;Last visit was with nurse practitioner Larita Fife, she complains of increased migraine since Jan 2016, 3-4 times a week, missing work, has stopped her SSRI, not helping her anywayShe is now taking Zomig as needed which help her migraine somewhat, but not persistently, her headache usually lasted 3-4 days, bilateral temporal region, severe pounding headaches with associated light noise sensitivity, nauseous,Imtrex Maxalt, does not help her headaches UPDATE 11/10/2014 Ms. Tracy Cohen, 25 year old female returns for follow-up. She was last seen in the office by Dr. Terrace Arabia 08/08/2014 and was placed on Topamax at that time. She was having 3-4 headaches a week and now she is having 1 headache per month  which last approximately 2 hours. Zomig works acutely when she takes the medication promptly. She continues to be very anxious, cries a lot, but denies any suicidal ideation. 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 She has FMLA paperwork to fill out today. She is asking about referral to psychiatry for her anxiety disorder. She returns for reevaluation  Update 11/09/2015:  Tracy Cohen is a 25 year old female with a history of migraine headaches. She returns today for follow-up. The patient reports that she has approximately one headache a month. She states her headaches are always located above the left eye. She does have photophobia, phonophobia and nausea and vomiting. She states that when she does have a headache it can sometimes linger for 9-10 hours. In the past she has tried Zomig for acute therapy and it has offered little benefit. She reports that she's been on Maxalt as well but it made her nauseous. She is also been on Topamax as a preventative therapy but she is no longer taking this medication. She returns today for an evaluation.    REVIEW OF SYSTEMS: Out of a complete 14 system review of symptoms, the patient complains only of the following symptoms, and all other reviewed systems are negative.  Headache, nervous/anxious, frequently, hearing loss, reviewed in ears, loss of vision, light sensitivity, excessive sweating  ALLERGIES: No Known Allergies  HOME MEDICATIONS: Outpatient Prescriptions Prior to Visit  Medication Sig Dispense Refill  . folic acid (FOLVITE) 1 MG tablet Take 1 mg by mouth daily. Reported on 11/09/2015  3  . etonogestrel (IMPLANON) 68 MG IMPL implant Inject 1 each into the skin once. Placed in Jan 2014    . FLUoxetine (PROZAC) 20 MG capsule Take 20 mg by mouth daily.    [name]  hydrOXYzine (ATARAX/VISTARIL) 25 MG tablet Take 25 mg by mouth at bedtime.  12  . mupirocin cream (BACTROBAN) 2 % Apply 1 application topically 2 (two) times daily. 15 g 0  . naproxen  sodium (ANAPROX) 220 MG tablet Take 440 mg by mouth every 12 (twelve) hours as needed (pain).    . topiramate (TOPAMAX) 100 MG tablet 1/2 tab po bid xone week, then one po bid (Patient taking differently: Take 100 mg by mouth 2 (two) times daily. ) 60 tablet 11  . zolmitriptan (ZOMIG) 5 MG tablet Take 1 tablet (5 mg total) by mouth as needed for migraine. (Patient not taking: Reported on 12/20/2014) 10 tablet 6   No facility-administered medications prior to visit.    PAST MEDICAL HISTORY: Past Medical History  Diagnosis Date  . Migraine   . Headache   . Weakness   . Dizziness   . Anxiety     PAST SURGICAL HISTORY: Past Surgical History  Procedure Laterality Date  . Tonsillectomy    . Adenoidectomy      FAMILY HISTORY: Family History  Problem Relation Age of Onset  . High blood pressure Mother   .       SOCIAL HISTORY: Social History   Social History  . Marital Status: Single    Spouse Name: N/A  . Number of Children: 0  . Years of Education: 13   Occupational History  .      [employer]   Social History Main Topics  . Smoking status: Never Smoker   . Smokeless tobacco: Never Used  . Alcohol Use: 0.0 oz/week    0 Standard drinks or equivalent per week     Comment: rare  . Drug Use: No  . Sexual Activity: Yes    Birth Control/ Protection: Condom     Comment: last intercourse July 28 2015( condom broke)   Other Topics Concern  . Not on file   Social History Narrative   Patient lives at home alone.    Patient works for [employer].   Patient has some college education.   Right handed.            PHYSICAL EXAM  Filed Vitals:   11/09/15 0743  BP: 122/98  Pulse: 86  Resp: 20  Height: 5\' 5"  (1.651 m)  Weight: 174 lb (78.926 kg)   Body mass index is 28.96 kg/(m^2).  Generalized: Well developed, in no acute distress   Neurological examination  Mentation: Alert oriented to time, place, history taking. Follows all commands speech and  language fluent Cranial nerve II-XII: Pupils were equal round reactive to light. Extraocular movements were full, visual field were full on confrontational test. Facial sensation and strength were normal. Uvula tongue midline. Head turning and shoulder shrug  were normal and symmetric. Motor: The motor testing reveals 5 over 5 strength of all 4 extremities. Good symmetric motor tone is noted throughout.  Sensory: Sensory testing is intact to soft touch on all 4 extremities. No evidence of extinction is noted.  Coordination: Cerebellar testing reveals good finger-nose-finger and heel-to-shin bilaterally.  Gait and station: Gait is normal. Tandem gait is normal. Romberg is negative. No drift is seen.  Reflexes: Deep tendon reflexes are symmetric and normal bilaterally.   DIAGNOSTIC DATA (LABS, IMAGING, TESTING) - I reviewed patient records, labs, notes, testing and imaging myself where available.       ASSESSMENT AND PLAN 25 y.o. year old female  has a past medical history of Migraine;  Headache; Weakness; Dizziness; and Anxiety. here with:  1. Migraine headache  The patient has approximately one headache a month. At this time I don't feel that preventative medication is necessary. I will try the patient on Imitrex to use for  acutetherapy. Advised that she should take 1 tablet at the onset of migraine and repeat in 2 hours if needed. I have went over the side effects of Imitrex and provided the patient with a handout reviewing this medication. Patient is amenable to this plan. She will follow-up in 6 months or sooner if needed.    Butch PennyMegan Zandria Woldt, MSN, NP-C 11/09/2015, 8:14 AM [facility] [address], [address] [place], [place] [id number] [phone]

## 2015-11-09 NOTE — Patient Instructions (Signed)
Try imitrex for acute migraine. Take 1 tablet at onset of migraine- may repeat in 2 hours if headache does not resolve. Not to exceed 2 tablets in 24 hours. If your symptoms worsen or you develop new symptoms please let us know.    Sumatriptan tablets What is this medicine? SUMATRIPTAN (soo ma TRIP tan) is used to treat migraines with or without aura. An aura is a strange feeling or visual disturbance that warns you of an attack. It is not used to prevent migraines. This medicine may be used for other purposes; ask your health care provider or pharmacist if you have questions. What should I tell my health care provider before I take this medicine? They need to know if you have any of these conditions: -circulation problems in fingers and toes -diabetes -heart disease -high blood pressure -high cholesterol -history of irregular heartbeat -history of stroke -kidney disease -liver disease -postmenopausal or surgical removal of uterus and ovaries -seizures -smoke tobacco -stomach or intestine problems -an unusual or allergic reaction to sumatriptan, other medicines, foods, dyes, or preservatives -pregnant or trying to get pregnant -breast-feeding How should I use this medicine? Take this medicine by mouth with a glass of water. Follow the directions on the prescription label. This medicine is taken at the first symptoms of a migraine. It is not for everyday use. If your migraine headache returns after one dose, you can take another dose as directed. You must leave at least 2 hours between doses, and do not take more than 100 mg as a single dose. Do not take more than 200 mg total in any 24 hour period. If there is no improvement at all after the first dose, do not take a second dose without talking to your doctor or health care professional. Do not take your medicine more often than directed. Talk to your pediatrician regarding the use of this medicine in children. Special care may be  needed. Overdosage: If you think you have taken too much of this medicine contact a poison control center or emergency room at once. NOTE: This medicine is only for you. Do not share this medicine with others. What if I miss a dose? This does not apply; this medicine is not for regular use. What may interact with this medicine? Do not take this medicine with any of the following medicines: -cocaine -ergot alkaloids like dihydroergotamine, ergonovine, ergotamine, methylergonovine -feverfew -MAOIs like Carbex, Eldepryl, Marplan, Nardil, and Parnate -other medicines for migraine headache like almotriptan, eletriptan, frovatriptan, naratriptan, rizatriptan, zolmitriptan -tryptophan This medicine may also interact with the following medications: -certain medicines for depression, anxiety, or psychotic disturbances This list may not describe all possible interactions. Give your health care provider a list of all the medicines, herbs, non-prescription drugs, or dietary supplements you use. Also tell them if you smoke, drink alcohol, or use illegal drugs. Some items may interact with your medicine. What should I watch for while using this medicine? Only take this medicine for a migraine headache. Take it if you get warning symptoms or at the start of a migraine attack. It is not for regular use to prevent migraine attacks. You may get drowsy or dizzy. Do not drive, use machinery, or do anything that needs mental alertness until you know how this medicine affects you. To reduce dizzy or fainting spells, do not sit or stand up quickly, especially if you are an older patient. Alcohol can increase drowsiness, dizziness and flushing. Avoid alcoholic drinks. Smoking cigarettes may increase the risk  of heart-related side effects from using this medicine. If you take migraine medicines for 10 or more days a month, your migraines may get worse. Keep a diary of headache days and medicine use. Contact your  healthcare professional if your migraine attacks occur more frequently. What side effects may I notice from receiving this medicine? Side effects that you should report to your doctor or health care professional as soon as possible: -allergic reactions like skin rash, itching or hives, swelling of the face, lips, or tongue -bloody or watery diarrhea -hallucination, loss of contact with reality -pain, tingling, numbness in the face, hands, or feet -seizures -signs and symptoms of a blood clot such as breathing problems; changes in vision; chest pain; severe, sudden headache; pain, swelling, warmth in the leg; trouble speaking; sudden numbness or weakness of the face, arm, or leg -signs and symptoms of a dangerous change in heartbeat or heart rhythm like chest pain; dizziness; fast or irregular heartbeat; palpitations, feeling faint or lightheaded; falls; breathing problems -signs and symptoms of a stroke like changes in vision; confusion; trouble speaking or understanding; severe headaches; sudden numbness or weakness of the face, arm, or leg; trouble walking; dizziness; loss of balance or coordination -stomach pain Side effects that usually do not require medical attention (report these to your doctor or health care professional if they continue or are bothersome): -changes in taste -facial flushing -headache -muscle cramps -muscle pain -nausea, vomiting -weak or tired This list may not describe all possible side effects. Call your doctor for medical advice about side effects. You may report side effects to FDA at 1-800-FDA-1088. Where should I keep my medicine? Keep out of the reach of children. Store at room temperature between 2 and 30 degrees C (36 and 86 degrees F). Throw away any unused medicine after the expiration date. NOTE: This sheet is a summary. It may not cover all possible information. If you have questions about this medicine, talk to your doctor, pharmacist, or health care  provider.    2016, Elsevier/Gold Standard. (2014-10-27 17:46:40)

## 2015-11-13 NOTE — Progress Notes (Signed)
I have reviewed and agreed above plan. 

## 2015-11-22 ENCOUNTER — Encounter (HOSPITAL_COMMUNITY): Payer: Self-pay | Admitting: Emergency Medicine

## 2015-11-22 DIAGNOSIS — M25512 Pain in left shoulder: Secondary | ICD-10-CM | POA: Insufficient documentation

## 2015-11-22 DIAGNOSIS — M25431 Effusion, right wrist: Secondary | ICD-10-CM | POA: Diagnosis not present

## 2015-11-22 DIAGNOSIS — Y92410 Unspecified street and highway as the place of occurrence of the external cause: Secondary | ICD-10-CM | POA: Diagnosis not present

## 2015-11-22 DIAGNOSIS — Y939 Activity, unspecified: Secondary | ICD-10-CM | POA: Diagnosis not present

## 2015-11-22 DIAGNOSIS — Y999 Unspecified external cause status: Secondary | ICD-10-CM | POA: Insufficient documentation
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
## 2015-11-23 ENCOUNTER — Emergency Department (HOSPITAL_COMMUNITY)
Admission: EM | Admit: 2015-11-23 | Discharge: 2015-11-23 | Disposition: A | Discharge status: Home / Self Care | Payer: No Typology Code available for payment source | Attending: Emergency Medicine | Admitting: Emergency Medicine

## 2015-11-23 ENCOUNTER — Emergency Department (HOSPITAL_COMMUNITY): Payer: No Typology Code available for payment source

## 2015-11-23 MED ORDER — NAPROXEN 500 MG PO TABS
500.0000 mg | ORAL_TABLET | Freq: Two times a day (BID) | ORAL | Status: DC
Start: 2015-11-23 — End: 2016-05-14

## 2015-11-23 MED ORDER — METHOCARBAMOL 500 MG PO TABS: 500.0000 mg | ORAL_TABLET | Freq: Two times a day (BID) | ORAL | Status: DC

## 2015-11-23 NOTE — ED Notes (Signed)
Patient able to ambulate independently  
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
## 2015-11-23 NOTE — ED Notes (Signed)
Pt transported to xray 
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
## 2016-05-14 ENCOUNTER — Encounter: Payer: Self-pay | Admitting: Adult Health

## 2016-05-14 ENCOUNTER — Ambulatory Visit (INDEPENDENT_AMBULATORY_CARE_PROVIDER_SITE_OTHER): Payer: 59 | Admitting: Adult Health

## 2016-05-14 VITALS — BP 132/82 | HR 88 | Resp 20 | Ht 65.0 in | Wt 190.0 lb

## 2016-05-14 DIAGNOSIS — G43009 Migraine without aura, not intractable, without status migrainosus: Secondary | ICD-10-CM

## 2016-05-14 MED ORDER — SUMATRIPTAN SUCCINATE 50 MG PO TABS: ORAL_TABLET | ORAL | 11 refills | Status: DC

## 2016-05-14 NOTE — Progress Notes (Signed)
PATIENT: Tracy Cohen DOB: 16-Oct-1990  REASON FOR VISIT: follow up HISTORY FROM: patient  HISTORY OF PRESENT ILLNESS: HISTORY Tracy Cohen): Initial evaluation:04/20/13: Tracy Cohen is a 26 years old right-handed Caucasian female, referred by emergency room for evaluation of migraine headaches  She had episodes of migraine headaches when she was younger, in her teens, but only rarely happen, over the past 5 years, she began to have frequent headaches, about twice a month, she complains lightheaded, dizziness, nausea, with her headaches, relieved by lying darkened quiet room.  She had diarrhea, nausea December fourth, went to the emergency room, symptoms improved after IV fluid, woke up December 9th 2014, noticed severe pounding headaches, holocranial, with associated light noise sensitivity, nauseous, vomitting, she presented to the emergency room again, was given IV cocktail of Compazine, Decadron, Toradol, only with temporary relief of her headaches, She came in today, complains of persistent headaches since December ninth, nausea or vomiting, difficulty eating, described 12 out of 10 headache today, was associated light sensitivity, she denies visual change, denies lateralized motor or sensory deficit.  UPDATE April 4th 2016;Last visit was with nurse practitioner Larita Fife, she complains of increased migraine since Jan 2016, 3-4 times a week, missing work, has stopped her SSRI, not helping her anywayShe is now taking Zomig as needed which help her migraine somewhat, but not persistently, her headache usually lasted 3-4 days, bilateral temporal region, severe pounding headaches with associated light noise sensitivity, nauseous,Imtrex Maxalt, does not help her headaches UPDATE 11/10/2014 Tracy Cohen, 26 year old female returns for follow-up. She was last seen in the office by Dr. Terrace Cohen 08/08/2014 and was placed on Topamax at that time. She was having 3-4 headaches a week and now she is having 1 headache per month  which last approximately 2 hours. Zomig works acutely when she takes the medication promptly. She continues to be very anxious, cries a lot, but denies any suicidal ideation. 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 She has FMLA paperwork to fill out today. She is asking about referral to psychiatry for her anxiety disorder. She returns for reevaluation  Update 11/09/2015(MM):  Tracy Cohen is a 26 year old female with a history of migraine headaches. She returns today for follow-up. The patient reports that she has approximately one headache a month. She states her headaches are always located above the left eye. She does have photophobia, phonophobia and nausea and vomiting. She states that when she does have a headache it can sometimes linger for 9-10 hours. In the past she has tried Zomig for acute therapy and it has offered little benefit. She reports that she's been on Maxalt as well but it made her nauseous. She is also been on Topamax as a preventative therapy but she is no longer taking this medication. She returns today for an evaluation.  Today 05/24/2016 (MM):   Tracy Cohen is a 26 year old female with a history of migraine headaches. She returns today for follow-up. She reports that her headache frequency has remained the same. She likely has one headache a month. She is using Imitrex. She states that when she does use Imitrex her headache usually resolves in 44 minutes to 1 hour. Without Imitrex her headache can last 5-6 hours. Overall she feels that she is doing well. She returns today for an evaluation.  REVIEW OF SYSTEMS: Out of a complete 14 system review of symptoms, the patient complains only of the following symptoms, and all other reviewed systems are negative.  Fatigue, chest pain, frequent waking,  daytime sleepiness, nervous/anxious, headache  ALLERGIES: No Known Allergies  HOME MEDICATIONS: Outpatient Medications Prior to Visit  Medication Sig Dispense Refill  .  folic acid (FOLVITE) 1 MG tablet Take 1 mg by mouth daily. Reported on 11/09/2015  3  . SUMAtriptan (IMITREX) 50 MG tablet Take 1 tablet at onset of migraine. May repeat in 2 hours if headache persists or recurs. Not to exceed 2 tablets in 24 hours 10 tablet 5  . methocarbamol (ROBAXIN) 500 MG tablet Take 1 tablet (500 mg total) by mouth 2 (two) times daily. 20 tablet 0  . naproxen (NAPROSYN) 500 MG tablet Take 1 tablet (500 mg total) by mouth 2 (two) times daily. 30 tablet 0   No facility-administered medications prior to visit.     PAST MEDICAL HISTORY: Past Medical History:  Diagnosis Date  . Anxiety   . Dizziness   . Headache   . Migraine   . Weakness     PAST SURGICAL HISTORY: Past Surgical History:  Procedure Laterality Date  . ADENOIDECTOMY    . TONSILLECTOMY      FAMILY HISTORY: Family History  Problem Relation Age of Onset  . High blood pressure Mother   .       SOCIAL HISTORY: Social History   Social History  . Marital status: Single    Spouse name: N/A  . Number of children: 0  . Years of education: 61   Occupational History  .  [employer]   Social History Main Topics  . Smoking status: Never Smoker  . Smokeless tobacco: Never Used  . Alcohol use 0.0 oz/week     Comment: rare  . Drug use: No  . Sexual activity: Yes    Birth control/ protection: Condom     Comment: last intercourse July 28 2015( condom broke)   Other Topics Concern  . Not on file   Social History Narrative   Patient lives at home alone.    Patient works for [employer]   Patient has some college education.   Right handed.            PHYSICAL EXAM  Vitals:   05/14/16 0731  BP: 132/82  Pulse: 88  Resp: 20  Weight: 190 lb (86.2 kg)  Height: 5\' 5"  (1.651 m)   Body mass index is 31.62 kg/m.  Generalized: Well developed, in no acute distress   Neurological examination  Mentation: Alert oriented to time, place, history taking. Follows all commands  speech and language fluent Cranial nerve II-XII: Pupils were equal round reactive to light. Extraocular movements were full, visual field were full on confrontational test. Facial sensation and strength were normal. Uvula tongue midline. Head turning and shoulder shrug  were normal and symmetric. Motor: The motor testing reveals 5 over 5 strength of all 4 extremities. Good symmetric motor tone is noted throughout.  Sensory: Sensory testing is intact to soft touch on all 4 extremities. No evidence of extinction is noted.  Coordination: Cerebellar testing reveals good finger-nose-finger and heel-to-shin bilaterally.  Gait and station: Gait is normal. Tandem gait is normal. Romberg is negative. No drift is seen.  Reflexes: Deep tendon reflexes are symmetric and normal bilaterally.   DIAGNOSTIC DATA (LABS, IMAGING, TESTING) - I reviewed patient records, labs, notes, testing and imaging myself where available.   ASSESSMENT AND PLAN 26 y.o. year old female  has a past medical history of Anxiety; Dizziness; Headache; Migraine; and Weakness. here with:  1. Migraine  headache  Overall the patient is doing well. She will continue using Imitrex for acute therapy. Patient advised that if her headache severity or frequency increases she should let us know. She will follow-up in one year with Dr. Terrace ArabiaYan.     Butch PennyMegan [name], MSN, NP-C 05/14/2016, 7:50 AM [facility] [address], [address] [place], [place] [address] [phone]

## 2016-05-14 NOTE — Patient Instructions (Signed)
Continue Imitrex for acute therapy If your symptoms worsen or you develop new symptoms please let us know.

## 2016-05-17 NOTE — Progress Notes (Signed)
I have reviewed and agreed above plan. 

## 2016-10-01 ENCOUNTER — Telehealth: Payer: Self-pay | Admitting: Neurology

## 2016-10-01 NOTE — Telephone Encounter (Signed)
Pt will fax FMLA. She needs it back dated to 08/25/16 for 1-2 days per episode.

## 2016-10-01 NOTE — Telephone Encounter (Signed)
Paperwork received, updated and returned to medical records.

## 2016-11-17 ENCOUNTER — Other Ambulatory Visit: Payer: Self-pay | Admitting: Adult Health

## 2016-11-19 ENCOUNTER — Telehealth: Payer: Self-pay | Admitting: Neurology

## 2016-11-19 NOTE — Telephone Encounter (Signed)
Pt is requesting a call about wanting to schedule testing for the increase in migraines that she is now experiencing daily.  Pt was offered the option of scheduling an appointment to be seen, she declined and is asking to be called 1st. Please call

## 2016-11-19 NOTE — Telephone Encounter (Signed)
Rn call patient about her increase in headaches. PT stated her headaches are daily, and the imitrex is not helping. Pt stated " will I be getting a scan that day, or just talking to somebody". Rn stated she was last seen 05/2016, and need to be seen by the NP or MD.". Rn ask patient was she taking anything over the counter for her headaches. Pt replied no she was not taking anything OTC. Pt stated ' I dont want to just come in there and pay a co payment, and nothing be done. " Rn stated to pt the NP will evaluate her, and assess her. Rn also stated there maybe a change in her medication management. Pt will check in at 0715am.

## 2016-11-20 NOTE — Progress Notes (Signed)
PATIENT: Tracy Cohen DOB: 08/11/1990  REASON FOR VISIT: follow up- migraine  HISTORY FROM: patient  HISTORY OF PRESENT ILLNESS: HISTORY HISTORY (YAN): Initial evaluation:04/20/13: Tracy Cohen is a 26 years old right-handed Caucasian female, referred by emergency room for evaluation of migraine headaches  She had episodes of migraine headaches when she was younger, in her teens, but only rarely happen, over the past 5 years, she began to have frequent headaches, about twice a month, she complains lightheaded, dizziness, nausea, with her headaches, relieved by lying darkened quiet room.  She had diarrhea, nausea December fourth, went to the emergency room, symptoms improved after IV fluid, woke up December 9th 2014, noticed severe pounding headaches, holocranial, with associated light noise sensitivity, nauseous, vomitting, she presented to the emergency room again, was given IV cocktail of Compazine, Decadron, Toradol, only with temporary relief of her headaches, She came in today, complains of persistent headaches since December ninth, nausea or vomiting, difficulty eating, described 12 out of 10 headache today, was associated light sensitivity, she denies visual change, denies lateralized motor or sensory deficit.  UPDATE April 4th 2016;Last visit was with nurse practitioner Larita FifeLynn, she complains of increased migraine since Jan 2016, 3-4 times a week, missing work, has stopped her SSRI, not helping her anywayShe is now taking Zomig as needed which help her migraine somewhat, but not persistently, her headache usually lasted 3-4 days, bilateral temporal region, severe pounding headaches with associated light noise sensitivity, nauseous,Imtrex Maxalt, does not help her headaches UPDATE 07/07/2016[name], 26 year old female returns for follow-up. She was last seen in the office by Dr. Terrace ArabiaYan 08/08/2014 and was placed on Topamax at that time. She was having 3-4 headaches a week and now she is having 1  headache per month which last approximately 2 hours. Zomig works acutely when she takes the medication promptly. She continues to be very anxious, cries a lot, but denies any suicidal ideation. 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 She has FMLA paperwork to fill out today. She is asking about referral to psychiatry for her anxiety disorder. She returns for reevaluation   Update 11/09/2015(MM):  Tracy Cohen is a 26 year old female with a history of migraine headaches. She returns today for follow-up. The patient reports that she has approximately one headache a month. She states her headaches are always located above the left eye. She does have photophobia, phonophobia and nausea and vomiting. She states that when she does have a headache it can sometimes linger for 9-10 hours. In the past she has tried Zomig for acute therapy and it has offered little benefit. She reports that she's been on Maxalt as well but it made her nauseous. She is also been on Topamax as a preventative therapy but she is no longer taking this medication. She returns today for an evaluation.  05/24/2016 (MM):   Tracy Cohen is a 26 year old female with a history of migraine headaches. She returns today for follow-up. She reports that her headache frequency has remained the same. She likely has one headache a month. She is using Imitrex. She states that when she does use Imitrex her headache usually resolves in 44 minutes to 1 hour. Without Imitrex her headache can last 5-6 hours. Overall she feels that she is doing well. She returns today for an evaluation.  Today 11/21/16  Tracy Cohen is a 26 year old female with a history of migraine headaches. She returns today for follow-up. She is currently not on any preventative medication. She is been  using Imitrex to treat her headaches. She reports that starting 1 month ago she began to have daily headaches. She reports that her headaches can occur in random parts of the  head. No consistent location. She does have photophobia, phonophobia, nausea and vomiting. She denies any visual disturbances. Denies any numbness or weakness in the arms or legs. She states that her job is not stressful as she works from home. However at this time she is unable to take any time off from work. The patient reports that her grandmother had an aneurysm. She seems very anxious about her headaches. The patient is concerned about trying Zonegran as her grandmother tried it and was unable to tolerate it due to weight gain. She returns today for an evaluation.    REVIEW OF SYSTEMS: Out of a complete 14 system review of symptoms, the patient complains only of the following symptoms, and all other reviewed systems are negative.  Light sensitivity, vomiting, frequency of urination, dizziness, headache, nervous/anxious  ALLERGIES: No Known Allergies  HOME MEDICATIONS: Outpatient Medications Prior to Visit  Medication Sig Dispense Refill  . folic acid (FOLVITE) 1 MG tablet Take 1 mg by mouth daily. Reported on 11/09/2015  3  . SUMAtriptan (IMITREX) 50 MG tablet TAKE 1 TAB AT ONSET OF MICGRAINE. MAY REPEAT IN 2HRS IF H/A PERSISTS OR RECURS, UP TO 2 TABS IN 24HR 10 tablet 0   No facility-administered medications prior to visit.     PAST MEDICAL HISTORY: Past Medical History:  Diagnosis Date  . Anxiety   . Dizziness   . Headache   . Migraine   . Weakness     PAST SURGICAL HISTORY: Past Surgical History:  Procedure Laterality Date  . ADENOIDECTOMY    . TONSILLECTOMY      FAMILY HISTORY: Family History  Problem Relation Age of Onset  . High blood pressure Mother     SOCIAL HISTORY: Social History   Social History  . Marital status: Single    Spouse name: N/A  . Number of children: 0  . Years of education: 72   Occupational History  .  [employer]   Social History Main Topics  . Smoking status: Never Smoker  . Smokeless tobacco: Never Used  . Alcohol  use 0.0 oz/week     Comment: rare  . Drug use: No  . Sexual activity: Yes    Birth control/ protection: Condom     Comment: last intercourse July 28 2015( condom broke)   Other Topics Concern  . Not on file   Social History Narrative   Patient lives at home alone.    Patient works for [employer]   Patient has some college education.   Right handed.            PHYSICAL EXAM  Vitals:   11/21/16 0721  BP: 115/73  Pulse: 74  Weight: 176 lb (79.8 kg)  Height: 5\' 5"  (1.651 m)   Body mass index is 29.29 kg/m.  Generalized: Well developed, in no acute distress   Neurological examination  Mentation: Alert oriented to time, place, history taking. Follows all commands speech and language fluent Cranial nerve II-XII: Pupils were equal round reactive to light. Extraocular movements were full, visual field were full on confrontational test. Facial sensation and strength were normal. Uvula tongue midline. Head turning and shoulder shrug  were normal and symmetric. Motor: The motor testing reveals 5 over 5 strength of all 4 extremities. Good symmetric  motor tone is noted throughout.  Sensory: Sensory testing is intact to soft touch on all 4 extremities. No evidence of extinction is noted.  Coordination: Cerebellar testing reveals good finger-nose-finger and heel-to-shin bilaterally.  Gait and station: Gait is normal. Tandem gait is normal. Romberg is negative. No drift is seen.  Reflexes: Deep tendon reflexes are symmetric and normal bilaterally.   DIAGNOSTIC DATA (LABS, IMAGING, TESTING) - I reviewed patient records, labs, notes, testing and imaging myself where available.     ASSESSMENT AND PLAN 26 y.o. year old female  has a past medical history of Anxiety; Dizziness; Headache; Migraine; and Weakness. here with :  1. Migraine headache 2. Daily headache  The patient has converted over to a daily headache. She has not gotten any relief with Imitrex. The patient is nervous  about doing a prednisone Dosepak. We will try a 3 day Decadron Dosepak. Hopefully this will break her headache cycle however this is a low-dose. We will start the patient on nortriptyline. She has been on this in the past and tolerated it well. She will begin by taking 10 mg at bedtime. I have reviewed side effects of both medications with the patient and given her a handout. The patient would like an MRI due to her family history. I will do an MRI of the brain with and without contrast to rule out any acute changes that could be the cause of her daily headache. If the patient's symptoms worsen or she develops new symptoms she should let us know. She will follow-up in 2 months or sooner if needed.   Butch Penny, MSN, NP-C 11/20/2016, 5:09 PM [facility] [address], [address] [place], [place] [address] [phone]

## 2016-11-21 ENCOUNTER — Ambulatory Visit (INDEPENDENT_AMBULATORY_CARE_PROVIDER_SITE_OTHER): Payer: 59 | Admitting: Adult Health

## 2016-11-21 ENCOUNTER — Encounter: Payer: Self-pay | Admitting: Adult Health

## 2016-11-21 ENCOUNTER — Telehealth: Payer: Self-pay | Admitting: Adult Health

## 2016-11-21 VITALS — BP 115/73 | HR 74 | Ht 65.0 in | Wt 176.0 lb

## 2016-11-21 DIAGNOSIS — G43011 Migraine without aura, intractable, with status migrainosus: Secondary | ICD-10-CM

## 2016-11-21 DIAGNOSIS — R51 Headache: Secondary | ICD-10-CM

## 2016-11-21 DIAGNOSIS — R519 Headache, unspecified: Secondary | ICD-10-CM

## 2016-11-21 MED ORDER — NORTRIPTYLINE HCL 10 MG PO CAPS
10.0000 mg | ORAL_CAPSULE | Freq: Every day | ORAL | 5 refills | Status: DC
Start: 2016-11-21 — End: 2017-05-31

## 2016-11-21 MED ORDER — DEXAMETHASONE 2 MG PO TABS: ORAL_TABLET | ORAL | 0 refills | Status: DC

## 2016-11-21 NOTE — Patient Instructions (Signed)
Your Plan:  Start Decadron 2 mg: Day 1: 3 tabs Day 2: 2 tabs Day 3: 1 tab  Start nortriptyline 10 mg at bedtime If your symptoms worsen or you develop new symptoms please let us know.   Thank you for [facility] at [facility]. I hope we have been able to provide you high quality care today.  You may receive a patient satisfaction survey over the next few weeks. We would appreciate your feedback and comments so that we may continue to improve ourselves and the health of our patients.  Nortriptyline capsules What is this medicine? NORTRIPTYLINE (nor TRIP ti leen) is used to treat depression. This medicine may be used for other purposes; ask your health care provider or pharmacist if you have questions. COMMON BRAND NAME(S): Aventyl, Pamelor What should I tell my health care provider before I take this medicine? They need to know if you have any of these conditions: -an alcohol problem -bipolar disorder or schizophrenia -difficulty passing urine, prostate trouble -glaucoma -heart disease or recent heart attack -liver disease -over active thyroid -seizures -thoughts or plans of suicide or a previous suicide attempt or family history of suicide attempt -an unusual or allergic reaction to nortriptyline, other medicines, foods, dyes, or preservatives -pregnant or trying to get pregnant -breast-feeding How should I use this medicine? Take this medicine by mouth with a glass of water. Follow the directions on the prescription label. Take your doses at regular intervals. Do not take it more often than directed. Do not stop taking this medicine suddenly except upon the advice of your doctor. Stopping this medicine too quickly may cause serious side effects or your condition may worsen. A special MedGuide will be given to you by the pharmacist with each prescription and refill. Be sure to read this information carefully each time. Talk to your pediatrician regarding  the use of this medicine in children. Special care may be needed. Overdosage: If you think you have taken too much of this medicine contact a poison control center or emergency room at once. NOTE: This medicine is only for you. Do not share this medicine with others. What if I miss a dose? If you miss a dose, take it as soon as you can. If it is almost time for your next dose, take only that dose. Do not take double or extra doses. What may interact with this medicine? Do not take this medicine with any of the following medications: -arsenic trioxide -certain medicines medicines for irregular heart beat -cisapride -halofantrine -linezolid -MAOIs like Carbex, Eldepryl, Marplan, Nardil, and Parnate -methylene blue (injected into a vein) -other medicines for mental depression -phenothiazines like perphenazine, thioridazine and chlorpromazine -pimozide -probucol -procarbazine -sparfloxacin -St. John's Wort -ziprasidone This medicine may also interact with any of the following medications: -atropine and related drugs like hyoscyamine, scopolamine, tolterodine and others -barbiturate medicines for inducing sleep or treating seizures, such as phenobarbital -cimetidine -medicines for diabetes -medicines for seizures like carbamazepine or phenytoin -reserpine -thyroid medicine This list may not describe all possible interactions. Give your health care provider a list of all the medicines, herbs, non-prescription drugs, or dietary supplements you use. Also tell them if you smoke, drink alcohol, or use illegal drugs. Some items may interact with your medicine. What should I watch for while using this medicine? Tell your doctor if your symptoms do not get better or if they get worse. Visit your doctor or health care professional for regular checks on your progress. Because  it may take several weeks to see the full effects of this medicine, it is important to continue your treatment as prescribed  by your doctor. Patients and their families should watch out for new or worsening thoughts of suicide or depression. Also watch out for sudden changes in feelings such as feeling anxious, agitated, panicky, irritable, hostile, aggressive, impulsive, severely restless, overly excited and hyperactive, or not being able to sleep. If this happens, especially at the beginning of treatment or after a change in dose, call your health care professional. Bonita Quin may get drowsy or dizzy. Do not drive, use machinery, or do anything that needs mental alertness until you know how this medicine affects you. Do not stand or sit up quickly, especially if you are an older patient. This reduces the risk of dizzy or fainting spells. Alcohol may interfere with the effect of this medicine. Avoid alcoholic drinks. Do not treat yourself for coughs, colds, or allergies without asking your doctor or health care professional for advice. Some ingredients can increase possible side effects. Your mouth may get dry. Chewing sugarless gum or sucking hard candy, and drinking plenty of water may help. Contact your doctor if the problem does not go away or is severe. This medicine may cause dry eyes and blurred vision. If you wear contact lenses you may feel some discomfort. Lubricating drops may help. See your eye doctor if the problem does not go away or is severe. This medicine can cause constipation. Try to have a bowel movement at least every 2 to 3 days. If you do not have a bowel movement for 3 days, call your doctor or health care professional. This medicine can make you more sensitive to the sun. Keep out of the sun. If you cannot avoid being in the sun, wear protective clothing and use sunscreen. Do not use sun lamps or tanning beds/booths. What side effects may I notice from receiving this medicine? Side effects that you should report to your doctor or health care professional as soon as possible: -allergic reactions like skin rash,  itching or hives, swelling of the face, lips, or tongue -anxious -breathing problems -changes in vision -confusion -elevated mood, decreased need for sleep, racing thoughts, impulsive behavior -eye pain -fast, irregular heartbeat -feeling faint or lightheaded, falls -feeling agitated, angry, or irritable -fever with increased sweating -hallucination, loss of contact with reality -seizures -stiff muscles -suicidal thoughts or other mood changes -tingling, pain, or numbness in the feet or hands -trouble passing urine or change in the amount of urine -trouble sleeping -unusually weak or tired -vomiting -yellowing of the eyes or skin Side effects that usually do not require medical attention (report to your doctor or health care professional if they continue or are bothersome): -change in sex drive or performance -change in appetite or weight -constipation -dizziness -dry mouth -nausea -tired -tremors -upset stomach This list may not describe all possible side effects. Call your doctor for medical advice about side effects. You may report side effects to FDA at 1-800-FDA-1088. Where should I keep my medicine? Keep out of the reach of children. Store at room temperature between 15 and 30 degrees C (59 and 86 degrees F). Keep container tightly closed. Throw away any unused medicine after the expiration date. NOTE: This sheet is a summary. It may not cover all possible information. If you have questions about this medicine, talk to your doctor, pharmacist, or health care provider.  2018 Elsevier/Gold Standard (2015-09-22 12:53:08)  Dexamethasone tablets What is this medicine?  DEXAMETHASONE (dex a METH a sone) is a corticosteroid. It is commonly used to treat inflammation of the skin, joints, lungs, and other organs. Common conditions treated include asthma, allergies, and arthritis. It is also used for other conditions, such as blood disorders and diseases of the adrenal  glands. This medicine may be used for other purposes; ask your health care provider or pharmacist if you have questions. COMMON BRAND NAME(S): CUSHINGS SYN[employer] DI[employer]NOSTIC, Decadron, DexPak Jr TaperPak, DexPak TaperPak, Zema-Pak, ZoDex, ZonaCort 11 Day, ZonaCort 7 Day What should I tell my health care provider before I take this medicine? They need to know if you have any of these conditions: -Cushing's syndrome -diabetes -glaucoma -heart problems or disease -high blood pressure -infection like herpes, measles, tuberculosis, or chickenpox -kidney disease -liver disease -mental problems -myasthenia gravis -osteoporosis -previous heart attack -seizures -stomach, ulcer or intestine disease including colitis and diverticulitis -thyroid problem -an unusual or allergic reaction to dexamethasone, corticosteroids, other medicines, lactose, foods, dyes, or preservatives -pregnant or trying to get pregnant -breast-feeding How should I use this medicine? Take this medicine by mouth with a drink of water. Follow the directions on the prescription label. Take it with food or milk to avoid stomach upset. If you are taking this medicine once a day, take it in the morning. Do not take more medicine than you are told to take. Do not suddenly stop taking your medicine because you may develop a severe reaction. Your doctor will tell you how much medicine to take. If your doctor wants you to stop the medicine, the dose may be slowly lowered over time to avoid any side effects. Talk to your pediatrician regarding the use of this medicine in children. Special care may be needed. Patients over 67 years old may have a stronger reaction and need a smaller dose. Overdosage: If you think you have taken too much of this medicine contact a poison control center or emergency room at once. NOTE: This medicine is only for you. Do not share this medicine with others. What if I miss a dose? If you miss a dose, take it  as soon as you can. If it is almost time for your next dose, talk to your doctor or health care professional. You may need to miss a dose or take an extra dose. Do not take double or extra doses without advice. What may interact with this medicine? Do not take this medicine with any of the following medications: -mifepristone, RU-486 -vaccines This medicine may also interact with the following medications: -amphotericin B -antibiotics like clarithromycin, erythromycin, and troleandomycin -aspirin and aspirin-like drugs -barbiturates like phenobarbital -carbamazepine -cholestyramine -cholinesterase inhibitors like donepezil, galantamine, rivastigmine, and tacrine -cyclosporine -digoxin -diuretics -ephedrine -female hormones, like estrogens or progestins and birth control pills -indinavir -isoniazid -ketoconazole -medicines for diabetes -medicines that improve muscle tone or strength for conditions like myasthenia gravis -NSAIDs, medicines for pain and inflammation, like ibuprofen or naproxen -phenytoin -rifampin -thalidomide -warfarin This list may not describe all possible interactions. Give your health care provider a list of all the medicines, herbs, non-prescription drugs, or dietary supplements you use. Also tell them if you smoke, drink alcohol, or use illegal drugs. Some items may interact with your medicine. What should I watch for while using this medicine? Visit your doctor or health care professional for regular checks on your progress. If you are taking this medicine over a prolonged period, carry an identification card with your name and address, the type and dose of your medicine,  and your doctor's name and address. This medicine may increase your risk of getting an infection. Stay away from people who are sick. Tell your doctor or health care professional if you are around anyone with measles or chickenpox. If you are going to have surgery, tell your doctor or health  care professional that you have taken this medicine within the last twelve months. Ask your doctor or health care professional about your diet. You may need to lower the amount of salt you eat. The medicine can increase your blood sugar. If you are a diabetic check with your doctor if you need help adjusting the dose of your diabetic medicine. What side effects may I notice from receiving this medicine? Side effects that you should report to your doctor or health care professional as soon as possible: -allergic reactions like skin rash, itching or hives, swelling of the face, lips, or tongue -changes in vision -fever, sore throat, sneezing, cough, or other signs of infection, wounds that will not heal -increased thirst -mental depression, mood swings, mistaken feelings of self importance or of being mistreated -pain in hips, back, ribs, arms, shoulders, or legs -redness, blistering, peeling or loosening of the skin, including inside the mouth -trouble passing urine or change in the amount of urine -swelling of feet or lower legs -unusual bleeding or bruising Side effects that usually do not require medical attention (report to your doctor or health care professional if they continue or are bothersome): -headache -nausea, vomiting -skin problems, acne, thin and shiny skin -weight gain This list may not describe all possible side effects. Call your doctor for medical advice about side effects. You may report side effects to FDA at 1-800-FDA-1088. Where should I keep my medicine? Keep out of the reach of children. Store at room temperature between 20 and 25 degrees C (68 and 77 degrees F). Protect from light. Throw away any unused medicine after the expiration date. NOTE: This sheet is a summary. It may not cover all possible information. If you have questions about this medicine, talk to your doctor, pharmacist, or health care provider.  2018 Elsevier/Gold Standard (2007-08-13  14:02:13)

## 2016-11-21 NOTE — Telephone Encounter (Signed)
Patient called office in reference to dexamethasone (DECADRON) 2 MG tablet.  Per patient she states the papers she read state if on birth control do not take the Decadron.  Also patient said nortriptyline (PAMELOR) 10 MG capsule does not work for her.  Please call

## 2016-11-21 NOTE — Progress Notes (Signed)
I have reviewed and agreed above plan. 

## 2016-11-21 NOTE — Telephone Encounter (Signed)
I called the patient. She said that she tried nortriptyline in the past that did not work for her. I suggested that we could try gabapentin however she refused and stated that she would try nortriptyline again.

## 2016-11-21 NOTE — Telephone Encounter (Addendum)
You saw pt this am.   She stated she let you know about the nortriptyline not working for her.  Chose something else?   Also I relayed that with steroids it may make the BCP less effective (use additional BCP while on steroids).

## 2016-12-07 ENCOUNTER — Other Ambulatory Visit: Payer: 59

## 2016-12-08 ENCOUNTER — Other Ambulatory Visit: Payer: Self-pay | Admitting: Adult Health

## 2016-12-08 ENCOUNTER — Ambulatory Visit
Admission: RE | Admit: 2016-12-08 | Discharge: 2016-12-08 | Disposition: A | Discharge status: Home / Self Care | Payer: 59 | Source: Ambulatory Visit | Attending: Adult Health | Admitting: Adult Health

## 2016-12-08 DIAGNOSIS — R51 Headache: Principal | ICD-10-CM

## 2016-12-08 DIAGNOSIS — G43011 Migraine without aura, intractable, with status migrainosus: Secondary | ICD-10-CM

## 2016-12-08 DIAGNOSIS — R519 Headache, unspecified: Secondary | ICD-10-CM

## 2016-12-09 ENCOUNTER — Telehealth: Payer: Self-pay | Admitting: *Deleted

## 2016-12-09 NOTE — Telephone Encounter (Signed)
LVM requesting call back re: MRI results. 

## 2016-12-10 NOTE — Telephone Encounter (Addendum)
Spoke with patient and informed her that her MRI of brain is relatively unremarkable. Advised her there are no acute changes such as tumor, bleed, sign of stroke. She verbalized understanding, appreciation.

## 2016-12-10 NOTE — Telephone Encounter (Signed)
Pt has called back and is asking for a returned call °

## 2017-02-24 ENCOUNTER — Telehealth: Payer: Self-pay | Admitting: *Deleted

## 2017-02-24 NOTE — Telephone Encounter (Signed)
RC/ pt from from [facility] today. Pt form is with the nurse.

## 2017-02-25 ENCOUNTER — Telehealth: Payer: Self-pay | Admitting: *Deleted

## 2017-02-25 NOTE — Telephone Encounter (Signed)
Completed and placed on MM/NP desk for review and signature.

## 2017-02-25 NOTE — Telephone Encounter (Signed)
Signed and forwarded to MR, Stanton KidneyDebra.

## 2017-02-25 NOTE — Telephone Encounter (Signed)
Pt [facility] form faxed on 02/25/17.

## 2017-02-25 NOTE — Telephone Encounter (Signed)
Signed.

## 2017-03-12 ENCOUNTER — Other Ambulatory Visit: Payer: Self-pay | Admitting: Certified Nurse Midwife

## 2017-03-12 DIAGNOSIS — N644 Mastodynia: Secondary | ICD-10-CM

## 2017-03-17 ENCOUNTER — Ambulatory Visit
Admission: RE | Admit: 2017-03-17 | Discharge: 2017-03-17 | Disposition: A | Discharge status: Home / Self Care | Payer: 59 | Source: Ambulatory Visit | Attending: Certified Nurse Midwife | Admitting: Certified Nurse Midwife

## 2017-03-17 DIAGNOSIS — N644 Mastodynia: Secondary | ICD-10-CM

## 2017-03-31 DIAGNOSIS — R590 Localized enlarged lymph nodes: Secondary | ICD-10-CM | POA: Insufficient documentation

## 2017-05-14 ENCOUNTER — Telehealth: Payer: Self-pay | Admitting: Adult Health

## 2017-05-14 NOTE — Telephone Encounter (Signed)
Called patient to discuss her call. Patient stated she was under a lot of stress which was giving her more migraines. She sated she needs her FMLA "extended" because she needs more time off from work.  Patient stated she needed to take a LOA from work. This RN advised her that it wasn't clear what she was requesting from [place]. Another female came on the phone, did not identify herself and began to speak very fast. She stated "basically she is having a mental breakdown and needs to take LOA". This person continued to state that the patient needs a psychological evaluation but cannot afford to pay out of pocket; her insurance only allows two different psychologists. She stated the patient needs LOA papers from her PCP stating she needs to be out of work. This RN then advised the individual that Dr Terrace ArabiaYan and Aundra MilletMegan are not her PCP, but are her neurologists. Advised they treat her for headaches and migraines only. The individual said okay and immediately hung up.

## 2017-05-14 NOTE — Telephone Encounter (Addendum)
Pt called she is having several "issues she has been going thru" and may need FMLA extended. The patient did not want to give any more detail and wanted Megan to call her back. She was appreciative.

## 2017-05-31 ENCOUNTER — Encounter: Payer: Self-pay | Admitting: Family Medicine

## 2017-05-31 ENCOUNTER — Other Ambulatory Visit: Payer: Self-pay

## 2017-05-31 ENCOUNTER — Ambulatory Visit (INDEPENDENT_AMBULATORY_CARE_PROVIDER_SITE_OTHER): Payer: 59 | Admitting: Family Medicine

## 2017-05-31 VITALS — BP 124/72 | HR 71 | Temp 97.8°F | Resp 16 | Ht 65.75 in | Wt 172.0 lb

## 2017-05-31 DIAGNOSIS — F411 Generalized anxiety disorder: Secondary | ICD-10-CM

## 2017-05-31 DIAGNOSIS — F99 Mental disorder, not otherwise specified: Secondary | ICD-10-CM | POA: Diagnosis not present

## 2017-05-31 DIAGNOSIS — F5105 Insomnia due to other mental disorder: Secondary | ICD-10-CM | POA: Diagnosis not present

## 2017-05-31 MED ORDER — BUSPIRONE HCL 10 MG PO TABS: 10.0000 mg | ORAL_TABLET | Freq: Two times a day (BID) | ORAL | 0 refills | Status: DC

## 2017-05-31 NOTE — Patient Instructions (Addendum)
Consider staying on the cymbalta 2 tabs every morning OR ever night (we can change this to 1 pill a day IF you decide you want to stay on it) and the lamictal 2 tabs every night for another 2 weeks since you have already put in 2 weeks effort into taking and trying these meds - we don't want to throw away the work you have put into taking these meds for the past 2 weeks w/o ever allowing you to see if it would actually make a difference.  Nortriptyline makes some people tired but it does have some very similar medications in the same class that we use to treat insomnia. Try increasing the nortriptyline to 2 tabs every night (you are on an EXTREMELY small dose) but if it doesn't help you sleep you can stop taking it and call me and I will call you in a very similar medicine that will help prevent migraines AND treat your insomnia. Your anxiety medications won't ever work if we can't get you some sleep. Lets start buspar twice a day.  If you have any side effects to the medication, cut it in half to take 1/2 tab twice a day for a week before going up to the whole tab. I would be happy to put in a referral to a good psychiatrist who takes your insurance if you would like - just let me know.   Recheck in 2 weeks. After your anxiety is controlled, we will start pulling off medicines to find what is most effective for you so you don't have to take so many medications but because your anxiety is so uncontrolled at this point, using combinations of different medications is much more likely to be effective quicker rather than dong different trials over many months. Not just one thing is causing your anxiety and as it worsens, it causes more anxiety and makes itself worse but there is not just one magic thing to fix it - by using different interventions to treat multiple points of the anxiety at the same time, we can get it under control and then you can learn to manage it as we slowly wean you off of  medications.   IF you received an x-ray today, you will receive an invoice from [facility]. Please contact [facility] at [phone] with questions or concerns regarding your invoice.   IF you received labwork today, you will receive an invoice from [facility]. Please contact [facility] at [phone] with questions or concerns regarding your invoice.   Our billing staff will not be able to assist you with questions regarding bills from these companies.  You will be contacted with the lab results as soon as they are available. The fastest way to get your results is to activate your My Chart account. Instructions are located on the last page of this paperwork. If you have not heard from [place] regarding the results in 2 weeks, please contact this office.      Living With Anxiety After being diagnosed with an anxiety disorder, you may be relieved to know why you have felt or behaved a certain way. It is natural to also feel overwhelmed about the treatment ahead and what it will mean for your life. With care and support, you can manage this condition and recover from it. How to cope with anxiety Dealing with stress Stress is your body's reaction to life changes and events, both good and bad. Stress can last just a few hours or it can be  ongoing. Stress can play a major role in anxiety, so it is important to learn both how to cope with stress and how to think about it differently. Talk with your health care provider or a counselor to learn more about stress reduction. He or she may suggest some stress reduction techniques, such as:  Music therapy. This can include creating or listening to music that you enjoy and that inspires you.  Mindfulness-based meditation. This involves being aware of your normal breaths, rather than trying to control your breathing. It can be done while sitting or walking.  Centering prayer. This is a kind of meditation that involves focusing on a word,  phrase, or sacred image that is meaningful to you and that brings you peace.  Deep breathing. To do this, expand your stomach and inhale slowly through your nose. Hold your breath for 3-5 seconds. Then exhale slowly, allowing your stomach muscles to relax.  Self-talk. This is a skill where you identify thought patterns that lead to anxiety reactions and correct those thoughts.  Muscle relaxation. This involves tensing muscles then relaxing them.  Choose a stress reduction technique that fits your lifestyle and personality. Stress reduction techniques take time and practice. Set aside 5-15 minutes a day to do them. Therapists can offer training in these techniques. The training may be covered by some insurance plans. Other things you can do to manage stress include:  Keeping a stress diary. This can help you learn what triggers your stress and ways to control your response.  Thinking about how you respond to certain situations. You may not be able to control everything, but you can control your reaction.  Making time for activities that help you relax, and not feeling guilty about spending your time in this way.  Therapy combined with coping and stress-reduction skills provides the best chance for successful treatment. Medicines Medicines can help ease symptoms. Medicines for anxiety include:  Anti-anxiety drugs.  Antidepressants.  Beta-blockers.  Medicines may be used as the main treatment for anxiety disorder, along with therapy, or if other treatments are not working. Medicines should be prescribed by a health care provider. Relationships Relationships can play a big part in helping you recover. Try to spend more time connecting with trusted friends and family members. Consider going to couples counseling, taking family education classes, or going to family therapy. Therapy can help you and others better understand the condition. How to recognize changes in your condition Everyone has  a different response to treatment for anxiety. Recovery from anxiety happens when symptoms decrease and stop interfering with your daily activities at home or work. This may mean that you will start to:  Have better concentration and focus.  Sleep better.  Be less irritable.  Have more energy.  Have improved memory.  It is important to recognize when your condition is getting worse. Contact your health care provider if your symptoms interfere with home or work and you do not feel like your condition is improving. Where to find help and support: You can get help and support from these sources:  Self-help groups.  Online and [employer] organizations.  A trusted spiritual leader.  Couples counseling.  Family education classes.  Family therapy.  Follow these instructions at home:  Eat a healthy diet that includes plenty of vegetables, fruits, whole grains, low-fat dairy products, and lean protein. Do not eat a lot of foods that are high in solid fats, added sugars, or salt.  Exercise. Most adults should do the  following: ? Exercise for at least 150 minutes each week. The exercise should increase your heart rate and make you sweat (moderate-intensity exercise). ? Strengthening exercises at least twice a week.  Cut down on caffeine, tobacco, alcohol, and other potentially harmful substances.  Get the right amount and quality of sleep. Most adults need 7-9 hours of sleep each night.  Make choices that simplify your life.  Take over-the-counter and prescription medicines only as told by your health care provider.  Avoid caffeine, alcohol, and certain over-the-counter cold medicines. These may make you feel worse. Ask your pharmacist which medicines to avoid.  Keep all follow-up visits as told by your health care provider. This is important. Questions to ask your health care provider  Would I benefit from therapy?  How often should I follow up with a health care  provider?  How long do I need to take medicine?  Are there any long-term side effects of my medicine?  Are there any alternatives to taking medicine? Contact a health care provider if:  You have a hard time staying focused or finishing daily tasks.  You spend many hours a day feeling worried about everyday life.  You become exhausted by worry.  You start to have headaches, feel tense, or have nausea.  You urinate more than normal.  You have diarrhea. Get help right away if:  You have a racing heart and shortness of breath.  You have thoughts of hurting yourself or others. If you ever feel like you may hurt yourself or others, or have thoughts about taking your own life, get help right away. You can go to your nearest emergency department or call:  Your local emergency services (911 in the [place]  A suicide crisis helpline, such as the National Suicide Prevention Lifeline at (605)595-6788. This is open 24-hours a day.  Summary  Taking steps to deal with stress can help calm you.  Medicines cannot cure anxiety disorders, but they can help ease symptoms.  Family, friends, and partners can play a big part in helping you recover from an anxiety disorder. This information is not intended to replace advice given to you by your health care provider. Make sure you discuss any questions you have with your health care provider. Document Released: 04/16/2016 Document Revised: 04/16/2016 Document Reviewed: 04/16/2016 Elsevier Interactive Patient Education  [employer].

## 2017-05-31 NOTE — Progress Notes (Signed)
Subjective:    Patient ID: Mcneil SoberMary F Valone, female    DOB: 03-Nov-1990, 27 y.o.   MRN: [id number] Chief Complaint  Patient presents with  . Establish Care    HPI Works with gyn, neurology, and psych. She sees [facility]. She has been seeing psychiatrist with [facility] - her first and last appointment was 2 weeks ago when she was started on both lamictal 25 qhs and then after 2 weeks increase to 50 qhs and cymbalta 30 bid which she is tolerating w/o any side effects but also has not noticed any benefit from her anxiety.  Her therapist thought that she might benefit from buspar so recommended she come see me. States she was told [facility] was more for people with mental problems and she doesn't have a mental problem - she just have severe anxiety x 1 yr with no known trigger. Has been on cymbalta 30 bid for 2 weeks and lamictal 25 now and doesn't feel any different so worried it is not going to work though she knows it takes 6-8 weeks to reach full effect. Has not yet increase lamictal to 50 - is at 2 wk mark now. She was prescribed xanax 0.5 from OB prior which she would take prn for panic attacks approx 3x/wk.  But her OB didn't want to cont to rx and advised to find a psychiatrist but couldn't find anyone who was accepting her health insurance.  Starting with xanax last year but has never needed treatment before and she is working with Edson SnowballShana to try to figure out why.  Father was dx'd with schizophrenia and MGM is bipolar. Has 5 siblings but they don't have any medical problems. No h/o substance abuse in the family or personally.  Does not sleep much at night for the past year.  She has trouble falling asleep and then frequent wakenings throughout the night.  Prior to least year she never had any trouble falling asleep.  She does not exercise. I asked her to tell me about her diet - the type of foods she is eating (after asking about sleep and exercise - she could not provide any answer. Has not  had breakfast. Last ate lunch at 4 pm yest, couldn't tell me what. Eats 1-2x/d. Finally stated that she ate more fried foods - prob not very healthy. Had routine labs done at gynecologist (and she had blood work when she saw a podiatrist once) so does not want any today. OB GYN Marl[facility] Mikeanya [facility].  [facility]. Life long migraines brought on by Stress but no other changes in medical health or body other than anxiety.  Works at [employer]. Past Medical History:  Diagnosis Date  . Anxiety   . Dizziness   . Headache   . Migraine   . Weakness    Past Surgical History:  Procedure Laterality Date  . ADENOIDECTOMY    . TONSILLECTOMY     Current Outpatient Medications on File Prior to Visit  Medication Sig Dispense Refill  . ALPRAZolam (XANAX) 0.5 MG tablet Take 0.5 mg by mouth daily as needed for anxiety.    . DULoxetine (CYMBALTA) 30 MG capsule Take 30 mg by mouth 2 (two) times daily.  3  . folic acid (FOLVITE) 1 MG tablet Take 1 mg by mouth daily. Reported on 11/09/2015  3  . lamoTRIgine (LAMICTAL) 25 MG tablet TAKE 1 TABLET BY MOUTH EVERY DAY FOR 2 WEEKS THEN 2 TABS ONCE DAILY, STOP IF  RASH OCCURS  1  . levonorgestrel-ethinyl estradiol (SEASONALE,INTROVALE,JOLESSA) 0.15-0.03 MG tablet Take 1 tablet by mouth daily.  4  . SUMAtriptan (IMITREX) 50 MG tablet TAKE 1 TAB AT ONSET OF MICGRAINE. MAY REPEAT IN 2HRS IF H/A PERSISTS OR RECURS, UP TO 2 TABS IN 24HR 10 tablet 0   No current facility-administered medications on file prior to visit.    No Known Allergies Family History  Problem Relation Age of Onset  . High blood pressure Mother    Social History   Socioeconomic History  . Marital status: Single    Spouse name: None  . Number of children: 0  . Years of education: 63  . Highest education level: None  Social Needs  . Financial resource strain: None  . Food insecurity - worry: None  . Food insecurity - inability: None  . Transportation  needs - medical: None  . Transportation needs - non-medical: None  Occupational History    Employer: [employer]    Comment: [employer]  Tobacco Use  . Smoking status: Never Smoker  . Smokeless tobacco: Never Used  Substance and Sexual Activity  . Alcohol use: Yes    Alcohol/week: 0.0 oz    Comment: rare  . Drug use: No  . Sexual activity: Yes    Birth control/protection: Condom    Comment: last intercourse July 28 2015( condom broke)  Other Topics Concern  . None  Social History Narrative   Patient lives at home alone.    Patient works for [employer]   Patient has some college education.   Right handed.         Depression screen PHQ 2/9 05/31/2017  Decreased Interest 2  Down, Depressed, Hopeless 2  PHQ - 2 Score 4  Altered sleeping 2  Tired, decreased energy 2  Change in appetite 0  Feeling bad or failure about yourself  2  Trouble concentrating 1  Moving slowly or fidgety/restless 0  Suicidal thoughts 0  PHQ-9 Score 11     Review of Systems See hpi    Objective:   Physical Exam  Constitutional: She is oriented to person, place, and time. She appears well-developed and well-nourished. No distress.  Room odor smelled of marijuana upon entering  HENT:  Head: Normocephalic and atraumatic.  Right Ear: External ear normal.  Eyes: Conjunctivae are normal. No scleral icterus.  Pulmonary/Chest: Effort normal.  Neurological: She is alert and oriented to person, place, and time.  Skin: Skin is warm and dry. She is not diaphoretic. No erythema.  Psychiatric: Her speech is normal. Her mood appears anxious. Her affect is angry. She is agitated. She exhibits a depressed mood.  Pt kept gathering her stuff, taking out her keys, putting her purse on - asked repeatedly if she was leaving in the middle of the appointment, pt responded, "no" and did not but looked as if she wanted to storm out. Became progressively tearful and agitated throughout visit         BP 124/72    Pulse 71   Temp 97.8 F (36.6 C) (Oral)   Resp 16   Ht 5' 5.75" (1.67 m)   Wt 172 lb (78 kg)   LMP 05/31/2017   SpO2 99%   BMI 27.97 kg/m   Assessment & Plan:   1. 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 Recommended either trying to increase dose of nortriptyline or change to amitriptyline as I think if she was sleeping well at night, that would likely help her anxiety significantly and nothing is going to be as effective if not well rested. Pt declines though as she is scared that something might make her feel sedated or hung-over the next morning - not willing to try anything at this time - states she will just start taking her nortryptyline nightly - but as this doesn't help her sleep and she doesn't like taking pills, I'm not sure why she would do this - when that was pointed out, she didn't respond.   Pt clearly with severe anxiety. She had some difficulty answering basic questions and I did not find her very open about her current state or her hx. I had to ask very pointed questions to get an answer.  She really wants to stop the cymbalta 30 bid and lamictal 25 (supposed to go up to 50) that she has been on for 2 weeks as she feels like she is taking to many pills. However, she is wanting me to place her on a 3x/d medication. . .  Encouraged pt to try to stay on the cymbalta and lamictal for another few weeks - she has already put in effort into taking the pills x 2 wks so seems a shame to throw the past 2 weeks of med-taking away and never know if it is something that might have eventually worked. However, pt cont to get progressively agitated as we talked of medicine, she was perseverating on how she hated it, though I pointed out that she came here requesting it. Has only  ever been tried on xanax prn (~3x/wk per pt) from her gyn for panic attacks during the past yr.  Tried to find pt ways of reducing pill burden or consolidating meds.  Tried to provide reassurance that after her sxs improve, we will pull of meds one a time so she can see which is working. Or if still not noting any improvement at f/u than we can wean off.  Sign release to get labs from gyn. Can f/u in 2 weeks if she wants to continue care - but seemed agitated and upset during visit. Offered referral to psychiatrist who was accepting her insurance but pt declines at this time.  Meds ordered this encounter  Medications  . busPIRone (BUSPAR) 10 MG tablet    Sig: Take 1 tablet (10 mg total) by mouth 2 (two) times daily.    Dispense:  60 tablet    Refill:  0    Norberto Sorenson, M.D.  Primary Care at [facility] [address] [place], [place] [address] [phone] phone [phone] fax  06/01/17 9:37 PM

## 2017-06-01 ENCOUNTER — Encounter: Payer: Self-pay | Admitting: Family Medicine

## 2017-06-01 DIAGNOSIS — F5105 Insomnia due to other mental disorder: Secondary | ICD-10-CM | POA: Insufficient documentation

## 2017-06-01 DIAGNOSIS — F99 Mental disorder, not otherwise specified: Secondary | ICD-10-CM

## 2017-06-02 ENCOUNTER — Telehealth: Payer: Self-pay | Admitting: Family Medicine

## 2017-06-02 NOTE — Telephone Encounter (Unsigned)
Copied from CRM [id number]. Topic: Quick Communication - See Telephone Encounter >> Jun 02, 2017 11:41 AM Waymon AmatoBurton, Donna F wrote: CRM for notification. See Telephone encounter for:  Pt is needing to talk wth Clelia CroftShaw regarding the Buspar and how the pharmacy is stating that they do not know when they will get it back and it has been on back order a while -what should the patient do next   Best number  [phone] 06/02/17.

## 2017-06-03 NOTE — Telephone Encounter (Signed)
Pls advise is there and alternative she can take.

## 2017-06-03 NOTE — Telephone Encounter (Signed)
No there is no alternative. Nothing in that class. She can call other pharmacies to see if they have it stock or get it online [employer]

## 2017-06-03 NOTE — Telephone Encounter (Signed)
Pt notified and verbalized understanding.

## 2017-06-17 ENCOUNTER — Other Ambulatory Visit: Payer: Self-pay | Admitting: Family Medicine

## 2017-06-17 ENCOUNTER — Telehealth: Payer: Self-pay | Admitting: General Practice

## 2017-06-17 MED ORDER — BUSPIRONE HCL 15 MG PO TABS: 15.0000 mg | ORAL_TABLET | Freq: Two times a day (BID) | ORAL | 2 refills | Status: DC

## 2017-06-17 NOTE — Telephone Encounter (Signed)
Copied from CRM [id number]. Topic: Inquiry >> Jun 17, 2017 10:42 AM Windy KalataMichael, Brittany Osier L, NT wrote: Patient wanted to advise Dr. Clelia CroftShaw that the Buspar 15 mg works better than the 10mg  does. She would like a refill on this. [facility].

## 2017-06-17 NOTE — Telephone Encounter (Signed)
Called pt and informed her of the refill. Made her an appt for 07/31/17 for a F/U.  She said thank you so much Dr. Clelia CroftShaw!   Thanks!

## 2017-06-17 NOTE — Telephone Encounter (Signed)
Glad it is working. Sent in 15mg . Recheck in 1-2 mos.

## 2017-07-31 ENCOUNTER — Telehealth: Payer: Self-pay | Admitting: Family Medicine

## 2017-07-31 ENCOUNTER — Ambulatory Visit: Payer: 59 | Admitting: Family Medicine

## 2017-07-31 ENCOUNTER — Other Ambulatory Visit: Payer: Self-pay

## 2017-07-31 MED ORDER — BUSPIRONE HCL 15 MG PO TABS: 15.0000 mg | ORAL_TABLET | Freq: Two times a day (BID) | ORAL | 2 refills | Status: DC

## 2017-07-31 NOTE — Telephone Encounter (Unsigned)
Copied from CRM [id number]. Topic: Quick Communication - Rx Refill/Question >> Jul 31, 2017  2:08 PM Stovall, [place] A wrote: Medication: busPIRone (BUSPAR) 15 MG tablet [[id number]][[id number]] Has the patient contacted their pharmacy?  No  (Agent: If no, request that the patient contact the pharmacy for the refill.) [facility]erred Pharm[facility] (with phone number [facility] [address] - pt has as an appt 4/13 would like med  Agent: Please be advised that RX refills may take up to 3 business days. We ask that you follow-up with your pharmacy.

## 2017-08-16 ENCOUNTER — Ambulatory Visit: Payer: 59 | Admitting: Family Medicine

## 2017-08-19 ENCOUNTER — Ambulatory Visit: Payer: 59 | Admitting: Family Medicine

## 2017-08-27 ENCOUNTER — Telehealth: Payer: Self-pay | Admitting: *Deleted

## 2017-08-27 NOTE — Telephone Encounter (Incomplete)
Pt [employer] form on

## 2017-08-27 NOTE — Telephone Encounter (Signed)
Pt [employer] form ion [employer] [place] [employer] desk

## 2017-08-29 NOTE — Telephone Encounter (Signed)
Called patient to advise her, per NP that she needs to schedule a follow up to have LOA forms filled out.  She was last seen July 2018 and doesn't have a FU scheduled. Also advised her that she needs to speak with billing to make payment before FU can be scheduled.  Patient stated she needs papers filled out, and if she can't get them filled out she wants her money back. This RN advised she must speak to billing. Patient raised her voice, stated she can't lose her job again, and she needs her money back today. This RN offered to transfer her to billing. Patient continued to raise her voice and state she needs her money back today until she can figure out what to do to get her papers filled out.  Transferred her call to Angie, billing.

## 2017-11-14 ENCOUNTER — Other Ambulatory Visit: Payer: Self-pay | Admitting: Family Medicine

## 2018-01-07 ENCOUNTER — Ambulatory Visit: Payer: 59 | Admitting: Physician Assistant

## 2018-02-04 ENCOUNTER — Ambulatory Visit: Payer: 59 | Admitting: Family Medicine

## 2018-03-10 ENCOUNTER — Ambulatory Visit: Payer: 59 | Admitting: Family Medicine

## 2018-04-18 ENCOUNTER — Ambulatory Visit: Payer: Self-pay | Admitting: Family Medicine

## 2018-05-13 ENCOUNTER — Telehealth: Payer: Self-pay | Admitting: Family Medicine

## 2018-05-13 NOTE — Telephone Encounter (Signed)
MyChart message sent to pt about their appointment on 05/27/18 with Dr Shaw °

## 2018-05-27 ENCOUNTER — Ambulatory Visit: Payer: Self-pay | Admitting: Family Medicine

## 2018-06-02 ENCOUNTER — Telehealth: Payer: Self-pay | Admitting: Neurology

## 2018-06-02 ENCOUNTER — Telehealth: Payer: Self-pay

## 2018-06-02 NOTE — Telephone Encounter (Signed)
Received message from [facility] that pt called in upset, allegedly referenced suicidal ideation.  Call to pt.  Pt extremely upset, difficult to understand, screaming and setting phone down.  Advised pt that Dr. Clelia Croft out of office, asked pt how I could help.  Pt continued to scream and difficult to understand.  Did reference being dead/might was well be dead.   Pt did deny SI but then continued to speak and scream incoherently.  Unable to redirect, pt not answering questions.  Call to 911 for welfare check.  Concern for SI.

## 2018-06-02 NOTE — Telephone Encounter (Signed)
This is a patient of Dr Terrace Arabia and was last here 2018. She wants a referral sent to [facility] for 2nd opinion. Any questions call patient thanks dg

## 2018-06-02 NOTE — Telephone Encounter (Addendum)
Returned the call to the patient to get more information.  She stated that she did not like seeing multiple providers (MD and NP), she did not like some of our office staff and she felt like a "[place] pig" with the medication changes.  She would like to see another neurologist at a different practice.  She was last seen here in July 2018.  She was instructed to request referral from her PCP.  She stated she did not have a PCP.  I did let her know she is also able to be seen in an urgent care setting so that a new referral can be made.

## 2018-06-04 ENCOUNTER — Telehealth: Payer: Self-pay

## 2018-06-04 ENCOUNTER — Ambulatory Visit: Payer: 59 | Admitting: Family Medicine

## 2018-06-04 ENCOUNTER — Ambulatory Visit: Payer: 59 | Admitting: Licensed Clinical Social Worker

## 2018-06-04 ENCOUNTER — Encounter: Payer: Self-pay | Admitting: Neurology

## 2018-06-04 ENCOUNTER — Other Ambulatory Visit: Payer: Self-pay

## 2018-06-04 ENCOUNTER — Encounter: Payer: Self-pay | Admitting: Family Medicine

## 2018-06-04 VITALS — BP 102/60 | HR 80 | Temp 98.5°F | Ht 65.0 in | Wt 189.0 lb

## 2018-06-04 DIAGNOSIS — F411 Generalized anxiety disorder: Secondary | ICD-10-CM

## 2018-06-04 DIAGNOSIS — G43009 Migraine without aura, not intractable, without status migrainosus: Secondary | ICD-10-CM | POA: Diagnosis not present

## 2018-06-04 DIAGNOSIS — R635 Abnormal weight gain: Secondary | ICD-10-CM | POA: Diagnosis not present

## 2018-06-04 MED ORDER — QUETIAPINE FUMARATE 25 MG PO TABS: 25.0000 mg | ORAL_TABLET | Freq: Every day | ORAL | 1 refills | Status: DC

## 2018-06-04 NOTE — Assessment & Plan Note (Signed)
Referral [facility] to [facility].

## 2018-06-04 NOTE — Telephone Encounter (Signed)
Patient left message, has questions regarding her medication prescribed today.  [phone]  Ples SpecterAlisa Diante Barley, RN [facility] RN)

## 2018-06-04 NOTE — BH Specialist Note (Signed)
Integrated Behavioral Health Initial Visit  MRN: [id number] Name: Tracy Cohen  Session Start time: 2:10  Session End time: 2:30 Total time: 15 minutes Type of Service: Integrated Behavioral Health  Warm Hand Off Completed.     Patient verbally consented to meet with [facility] Consultant about presenting concerns. SUBJECTIVE: Tracy Cohen is a 28 y.o. female referred by Dr. Obie Dredge for assistance with resources for ongoing therapy to assist with managing her anxiety. Report of symptoms: feeling anxious, tearful, difficulty with focus, not wanting to be around crowds of people, sleep difficulties, constant worry, ASSESSMENT: Mood: Euthymic and Affect: Appropriate and Tearful at times. Patient was pleasant and engaged in conversation. She attended outpatient therapy in 2019 states she does not think it really helped. Patient is currently experiencing symptoms of anxiety and depression which are exacerbated by her recent job change 4 months ago. She now works in an office and has notices and increase in her symptoms when she is talking on the phone and around other people. Reports no concern when at home alone.  Patient may benefit from further assessment and brief therapeutic interventions to assist with managing her symptoms however she is reluctant at this time.  GOALS: Patient will: 1. Reduce symptoms of: anxiety and depression 2. Increase knowledge and/or ability of: coping skills and self-management skills  PLAN: 1. Follow up with [facility] for therapy options 2. Implement relaxed breathing _______________________________________________________  Duration of CURRENT symptoms:3 to 4 months impact on function:difficult doing job and going out in public Psychiatric History - Diagnoses:General Anxiety disorder - Hospitalizations:  no - Pharmacotherapy: xanax, PCP will start seroquel 25mg  - Outpatient therapy: none currently , last seen at [facility] from previous employer (  [employer])  LIFE CONTEXT: Family and Social: lives alone, has family local School/Work: works FT 1pm to [employer] Changes: Started new job about 4 months ago, was working from home in previous job but now works in an office INTERVENTION:  Copywriter, advertising and Supportive Counseling,  Psychoeducation, discussed therapy resouces  [facility]   [phone] 9:28 AM

## 2018-06-04 NOTE — Telephone Encounter (Signed)
Called and spoke with patient.  She was asking questions about Seroquel causing weight gain.  Assured her that she is starting at the lowest dose and that while Seroquel does have a risk of weight gain it is listed as moderate and is less than other medications she has taken in the past.  Encouraged her to try the medication as the potential benefits outweigh this risk at this time.  She agreed.  Advised her to call back if she experiences more weight gain.

## 2018-06-04 NOTE — Assessment & Plan Note (Addendum)
Warm hand-off to integrated behavioral health.  Recommend patient go to therapy.  Patient states that she would like to think about therapy at this time. - start seroquel 25mg  QHS - return in 1 month - patient given return precautions - CMP, CBC, TSH as patient also having weight gain and thyroid may be contributing to both

## 2018-06-04 NOTE — Assessment & Plan Note (Addendum)
Will check labs.  Possibly affected by uncontrolled mood disorder as well. - CMP, CBC, TSH

## 2018-06-04 NOTE — Patient Instructions (Signed)
Thank you for coming to see me today. It was a pleasure. Today we talked about:   Your anxiety: We will start Seroquel 25mg .  Take it every night.    Thank you for talking with Gavin Pound.  Please ensure that you find another therapist as I think this would help you.  Your migraines:  I have placed a referral to [facility].  If you don't hear from them in the next 2 weeks, please call our office.  Your weight: we will check some labs and I will call you if they are abnormal, but otherwise will send a letter.  Please follow-up with me in 1 month.  If you have any questions or concerns, please do not hesitate to call the office at [phone].  Best,   Luis Abed, DO

## 2018-06-04 NOTE — Progress Notes (Signed)
Subjective: Chief Complaint  Patient presents with  . New Patient (Initial Visit)  . Referral    2nd opinion to neurology     HPI: Tracy Cohen is a 28 y.o. presenting to clinic today to discuss the following:  1 Establish Care Patient presents to establish care.  Patient is currently not taking any prescribed medications.  Per chart review, patient seen at [facility] previously   2 Anxiety Takes xanax prn.  Feels as though her anxiety has been worsening over the past few weeks.  Has seen therapist at [facility] and a few at [facility].  Has not been seeing them in the past year.  Was on buspar previously, but stopped since last [facility] or April.  Also took Cymbalta and Lamictal and stopped around the same time.  Patient states that she felt like she was on a lot of medications and didn't like that.  No hallucinations, no SI/HI.  GAD 7 : Generalized Anxiety Score 06/04/2018  Nervous, Anxious, on Edge 3  Control/stop worrying 3  Worry too much - different things 3  Trouble relaxing 3  Restless 3  Easily annoyed or irritable 3  Afraid - awful might happen 1  Total GAD 7 Score 19  Anxiety Difficulty Very difficult    Depression screen [facility] 2/9 06/04/2018 06/04/2018 05/31/2017  Decreased Interest 3 0 2  Down, Depressed, Hopeless 1 0 2  PHQ - 2 Score 4 0 4  Altered sleeping 3 - 2  Tired, decreased energy 0 - 2  Change in appetite 3 - 0  Feeling bad or failure about yourself  3 - 2  Trouble concentrating 3 - 1  Moving slowly or fidgety/restless 3 - 0  Suicidal thoughts 0 - 0  PHQ-9 Score 19 - 11  Difficult doing work/chores Very difficult - -    3 Weight Fluctuation States that she has been overeating and finds herself hungry after she just ate.  Notes weight fluctuation in the last 15 months.   Occasionally loses appetite, but is mostly overeating.  States that 8 months ago she worse a size 8 and is now wearing a size 12.    4 Migraines Patient no longer taking sumatriptan.   States that she was previously seen by [facility] neurology, but would like a referral to [facility] for second opinion.  Continues to headaches.  Health Maintenance: declines flu shot     ROS noted in HPI.   Past Medical, Surgical, Social, and Family History Reviewed & Updated per EMR.   Pertinent Historical Findings include:   Social History   Tobacco Use  Smoking Status Never Smoker  Smokeless Tobacco Never Used      Objective: BP 102/60   Pulse 80   Temp 98.5 F (36.9 C) (Oral)   Ht 5\' 5"  (1.651 m)   Wt 189 lb (85.7 kg)   LMP 05/21/2018   SpO2 98%   BMI 31.45 kg/m  Vitals and nursing notes reviewed  Physical Exam: General: 28 y.o. female in NAD HEENT: NCAT, MMM Neck: Supple, thyroid palpated does not appear enlarged, non-tender, no cervical LAD Cardio: RRR no m/r/g Lungs: CTAB, no wheezing, no rhonchi, no crackles Abdomen: Soft, non-tender to palpation Skin: warm and dry Extremities: No edema Neuro: grossly intact Psych: linear thought process, no SI/HI/hallucinations, mood and affect appropriate for circumstance    No results found for this or any previous visit (from the past 72 hour(s)).  Assessment/Plan:  GAD (generalized anxiety disorder)  Warm hand-off to integrated behavioral health.  Recommend patient go to therapy.  Patient states that she would like to think about therapy at this time. - start seroquel 25mg  QHS - return in 1 month - patient given return precautions - CMP, CBC, TSH as patient also having weight gain and thyroid may be contributing to both  Migraine Referral placed to [facility].  Weight gain Will check labs.  Possibly affected by uncontrolled mood disorder as well. - CMP, CBC, TSH     PATIENT EDUCATION PROVIDED: See AVS    Diagnosis and plan along with any newly prescribed medication(s) were discussed in detail with this patient today. The patient verbalized understanding and agreed with the plan. Patient advised if  symptoms worsen return to clinic or ER.   Health Maintainance: declined flu shot   Orders Placed This Encounter  Procedures  . TSH  . Comprehensive metabolic panel  . CBC  . Ambulatory referral to Neurology    Referral Priority:   Routine    Referral Type:   Consultation    Referral Reason:   Specialty Services Required    Requested Specialty:   Neurology    Number of Visits Requested:   1    Meds ordered this encounter  Medications  . QUEtiapine (SEROQUEL) 25 MG tablet    Sig: Take 1 tablet (25 mg total) by mouth at bedtime.    Dispense:  30 tablet    Refill:  1     Luis Abed, DO 06/04/2018, 2:59 PM PGY-1 [facility]

## 2018-06-05 ENCOUNTER — Encounter: Payer: Self-pay | Admitting: Family Medicine

## 2018-06-05 LAB — COMPREHENSIVE METABOLIC PANEL
A/G RATIO: 1.5 (ref 1.2–2.2)
ALBUMIN: 4.3 g/dL (ref 3.9–5.0)
ALT: 13 IU/L (ref 0–32)
AST: 12 IU/L (ref 0–40)
Alkaline Phosphatase: 61 IU/L (ref 39–117)
BUN / CREAT RATIO: 11 (ref 9–23)
BUN: 8 mg/dL (ref 6–20)
CALCIUM: 9.4 mg/dL (ref 8.7–10.2)
CO2: 23 mmol/L (ref 20–29)
Chloride: 100 mmol/L (ref 96–106)
Creatinine, Ser: 0.73 mg/dL (ref 0.57–1.00)
GFR calc Af Amer: 129 mL/min/{1.73_m2} (ref >59)
GFR, EST NON AFRICAN AMERICAN: 112 mL/min/{1.73_m2} (ref >59)
Globulin, Total: 2.9 g/dL (ref 1.5–4.5)
Glucose: 89 mg/dL (ref 65–99)
Potassium: 4.5 mmol/L (ref 3.5–5.2)
Sodium: 141 mmol/L (ref 134–144)
TOTAL PROTEIN: 7.2 g/dL (ref 6.0–8.5)

## 2018-06-05 LAB — CBC
Hematocrit: 39.1 % (ref 34.0–46.6)
Hemoglobin: 12.5 g/dL (ref 11.1–15.9)
MCH: 26.3 pg — ABNORMAL LOW (ref 26.6–33.0)
MCHC: 32 g/dL (ref 31.5–35.7)
MCV: 82 fL (ref 79–97)
PLATELETS: 340 10*3/uL (ref 150–450)
RBC: 4.76 x10E6/uL (ref 3.77–5.28)
RDW: 12.5 % (ref 11.7–15.4)
WBC: 9.8 10*3/uL (ref 3.4–10.8)

## 2018-06-05 LAB — TSH: TSH: 0.617 u[IU]/mL (ref 0.450–4.500)

## 2018-06-05 NOTE — Progress Notes (Signed)
Letter sent to patient.  All labs WNL.

## 2018-06-23 ENCOUNTER — Ambulatory Visit: Payer: 59 | Admitting: Family Medicine

## 2018-07-07 ENCOUNTER — Ambulatory Visit: Payer: 59 | Admitting: Family Medicine

## 2018-07-07 ENCOUNTER — Encounter: Payer: Self-pay | Admitting: Family Medicine

## 2018-07-07 ENCOUNTER — Other Ambulatory Visit: Payer: Self-pay

## 2018-07-07 VITALS — BP 114/62 | HR 74 | Temp 98.4°F | Wt 188.4 lb

## 2018-07-07 DIAGNOSIS — K219 Gastro-esophageal reflux disease without esophagitis: Secondary | ICD-10-CM

## 2018-07-07 DIAGNOSIS — F411 Generalized anxiety disorder: Secondary | ICD-10-CM | POA: Diagnosis not present

## 2018-07-07 NOTE — Assessment & Plan Note (Signed)
Symptoms and exam consistent with GERD.  Patient declines daily medication. -Advised to avoid spicy and greasy foods, or other foods that seem to worsen the pain -Can use OTC treatments

## 2018-07-07 NOTE — Patient Instructions (Signed)
Thank you for coming to see me today. It was a pleasure. Today we talked about:   Anxiety: I'm glad that you're doing better!  Stomach pain: You have acid reflux.  Avoid spicy foods and greasy foods.   Please follow-up with me in 6 months.  If you have any questions or concerns, please do not hesitate to call the office at [phone].  Best,   Luis Abed, DO

## 2018-07-07 NOTE — Progress Notes (Signed)
     Subjective: Chief Complaint  Patient presents with  . Follow-up     HPI: Tracy Cohen is a 28 y.o. presenting to clinic today to discuss the following:  1 Anxiety Follow Up Patient was prescribed Seroquel, but never filled the Rx.  States that she has been speaking with her pastor.  She states, "I think I was having a lot of anxiety due to traveling."  She states that she is feeling much better at this time.  She states that she does not feel the need for any therapy or medication.  Denies SI.  GAD 7 : Generalized Anxiety Score 07/07/2018 06/04/2018  Nervous, Anxious, on Edge 1 3  Control/stop worrying 0 3  Worry too much - different things 1 3  Trouble relaxing 0 3  Restless 1 3  Easily annoyed or irritable 1 3  Afraid - awful might happen 1 1  Total GAD 7 Score 5 19  Anxiety Difficulty - Very difficult     2 Stomach Pain Pain worse after eating greasy foods.  Improved with restricting diet to seafood, but states "I got burnt out."  Regular BM's.  Has family members with reflux and thinks that may be the cause.  No weight loss.  Normal appetite.     ROS noted in HPI.   Past Medical, Surgical, Social, and Family History Reviewed & Updated per EMR.   Pertinent Historical Findings include:   Social History   Tobacco Use  Smoking Status Never Smoker  Smokeless Tobacco Never Used      Objective: BP 114/62   Pulse 74   Temp 98.4 F (36.9 C) (Oral)   Wt 188 lb 6.4 oz (85.5 kg)   SpO2 98%   BMI 31.35 kg/m  Vitals and nursing notes reviewed  Physical Exam:  General: 28 y.o. female in NAD Cardio: RRR no m/r/g Lungs: CTAB, no wheezing, no rhonchi, no crackles Abdomen: Soft, mild tenderness to palpation of epigastric region, positive bowel sounds Skin: warm and dry Extremities: No edema   No results found for this or any previous visit (from the past 72 hour(s)).  Assessment/Plan:  GAD (generalized anxiety disorder) GAD symptoms were greatly improved  from 19 to 5.  Patient not currently taking medications.  She states she is talking to her pastor and she does not want any form of therapy at this time.  Return precautions given, patient voiced understanding.  Patient to call if starts having increased anxiety or SI.  Gastroesophageal reflux disease Symptoms and exam consistent with GERD.  Patient declines daily medication. -Advised to avoid spicy and greasy foods, or other foods that seem to worsen the pain -Can use OTC treatments     PATIENT EDUCATION PROVIDED: See AVS    Diagnosis and plan along with any newly prescribed medication(s) were discussed in detail with this patient today. The patient verbalized understanding and agreed with the plan. Patient advised if symptoms worsen return to clinic or ER.     No orders of the defined types were placed in this encounter.   No orders of the defined types were placed in this encounter.    Luis Abed, DO 07/07/2018, 11:57 AM PGY-1 [facility]

## 2018-07-07 NOTE — Assessment & Plan Note (Signed)
GAD symptoms were greatly improved from 19 to 5.  Patient not currently taking medications.  She states she is talking to her pastor and she does not want any form of therapy at this time.  Return precautions given, patient voiced understanding.  Patient to call if starts having increased anxiety or SI.

## 2018-07-15 ENCOUNTER — Other Ambulatory Visit: Payer: Self-pay

## 2018-07-15 ENCOUNTER — Encounter (HOSPITAL_COMMUNITY): Payer: Self-pay

## 2018-07-15 ENCOUNTER — Emergency Department (HOSPITAL_COMMUNITY): Payer: 59

## 2018-07-15 ENCOUNTER — Emergency Department (HOSPITAL_COMMUNITY)
Admission: EM | Admit: 2018-07-15 | Discharge: 2018-07-15 | Disposition: A | Discharge status: Home / Self Care | Payer: 59 | Attending: Emergency Medicine | Admitting: Emergency Medicine

## 2018-07-15 DIAGNOSIS — R1084 Generalized abdominal pain: Secondary | ICD-10-CM

## 2018-07-15 DIAGNOSIS — R1010 Upper abdominal pain, unspecified: Secondary | ICD-10-CM | POA: Insufficient documentation

## 2018-07-15 DIAGNOSIS — R197 Diarrhea, unspecified: Secondary | ICD-10-CM | POA: Insufficient documentation

## 2018-07-15 DIAGNOSIS — R112 Nausea with vomiting, unspecified: Secondary | ICD-10-CM | POA: Diagnosis present

## 2018-07-15 LAB — CBC
HEMATOCRIT: 40.9 % (ref 36.0–46.0)
Hemoglobin: 12.8 g/dL (ref 12.0–15.0)
MCH: 27.3 pg (ref 26.0–34.0)
MCHC: 31.3 g/dL (ref 30.0–36.0)
MCV: 87.2 fL (ref 80.0–100.0)
NRBC: 0 % (ref 0.0–0.2)
Platelets: 317 10*3/uL (ref 150–400)
RBC: 4.69 MIL/uL (ref 3.87–5.11)
RDW: 14.6 % (ref 11.5–15.5)
WBC: 11.6 10*3/uL — ABNORMAL HIGH (ref 4.0–10.5)

## 2018-07-15 LAB — COMPREHENSIVE METABOLIC PANEL
ALBUMIN: 4.4 g/dL (ref 3.5–5.0)
ALK PHOS: 58 U/L (ref 38–126)
ALT: 18 U/L (ref 0–44)
ANION GAP: 10 (ref 5–15)
AST: 24 U/L (ref 15–41)
BUN: 11 mg/dL (ref 6–20)
CALCIUM: 9.3 mg/dL (ref 8.9–10.3)
CHLORIDE: 108 mmol/L (ref 98–111)
CO2: 25 mmol/L (ref 22–32)
CREATININE: 0.69 mg/dL (ref 0.44–1.00)
GFR calc non Af Amer: >60 mL/min (ref >60)
Glucose, Bld: 96 mg/dL (ref 70–99)
Potassium: 3.9 mmol/L (ref 3.5–5.1)
SODIUM: 143 mmol/L (ref 135–145)
Total Bilirubin: 0.4 mg/dL (ref 0.3–1.2)
Total Protein: 8 g/dL (ref 6.5–8.1)

## 2018-07-15 LAB — URINALYSIS, ROUTINE W REFLEX MICROSCOPIC
BACTERIA UA: NONE SEEN
BILIRUBIN URINE: NEGATIVE
Glucose, UA: NEGATIVE mg/dL
Hgb urine dipstick: NEGATIVE
Ketones, ur: NEGATIVE mg/dL
LEUKOCYTE UA: NEGATIVE
Nitrite: NEGATIVE
Protein, ur: 30 mg/dL — AB
SPECIFIC GRAVITY, URINE: 1.024 (ref 1.005–1.030)
pH: 8 (ref 5.0–8.0)

## 2018-07-15 LAB — I-STAT BETA HCG BLOOD, ED (MC, WL, AP ONLY)

## 2018-07-15 LAB — LIPASE, BLOOD: LIPASE: 31 U/L (ref 11–51)

## 2018-07-15 MED ORDER — ONDANSETRON HCL 4 MG PO TABS: 4.0000 mg | ORAL_TABLET | Freq: Four times a day (QID) | ORAL | 0 refills | Status: DC

## 2018-07-15 MED ORDER — IOPAMIDOL (ISOVUE-300) INJECTION 61%
100.0000 mL | Freq: Once | INTRAVENOUS | Status: AC | PRN
  Administered 2018-07-15: 100 mL via INTRAVENOUS

## 2018-07-15 MED ORDER — KETOROLAC TROMETHAMINE 15 MG/ML IJ SOLN
15.0000 mg | Freq: Once | INTRAMUSCULAR | Status: AC
  Administered 2018-07-15: 15 mg via INTRAVENOUS
  Filled 2018-07-15: qty 1

## 2018-07-15 MED ORDER — SODIUM CHLORIDE 0.9% FLUSH
3.0000 mL | Freq: Once | INTRAVENOUS | Status: AC
  Administered 2018-07-15: 3 mL via INTRAVENOUS

## 2018-07-15 MED ORDER — SODIUM CHLORIDE 0.9 % IV BOLUS
1000.0000 mL | Freq: Once | INTRAVENOUS | Status: AC
  Administered 2018-07-15: 1000 mL via INTRAVENOUS

## 2018-07-15 MED ORDER — ONDANSETRON HCL 4 MG/2ML IJ SOLN
4.0000 mg | Freq: Once | INTRAMUSCULAR | Status: AC
  Administered 2018-07-15: 4 mg via INTRAVENOUS
  Filled 2018-07-15: qty 2

## 2018-07-15 NOTE — Discharge Instructions (Signed)
Return for any problem.  Follow-up with your regular care providers as instructed. 

## 2018-07-15 NOTE — ED Provider Notes (Signed)
Tracy Cohen Provider Note   CSN: [id number] Arrival date & time: 07/15/18  1228    History   Chief Complaint Chief Complaint  Patient presents with  . Abdominal Pain  . Emesis    HPI Tracy Cohen is a 28 y.o. female.     28 year old female with prior medical history as detailed below presents for evaluation of nausea, vomiting, and diarrhea.  Patient reports symptoms began acutely this morning around 4 AM.  She reports multiple episodes of vomiting since.  She denies fever.  She reports crampy upper abdominal discomfort.  She denies chest pain, shortness of breath, back pain, or other acute complaint.  She is reporting that she has had 2-3 episodes similar to this over the last 2 to 3 months.  Symptoms usually last approximately 1 day and then improved.  She denies any significant prior work-up for these complaints.  The history is provided by the patient and medical records.  Abdominal Pain  Pain location:  Epigastric Pain quality: aching and cramping   Pain radiates to:  Does not radiate Pain severity:  Mild Onset quality:  Gradual Duration:  12 hours Timing:  Constant Progression:  Worsening Chronicity:  New Relieved by:  Nothing Worsened by:  Nothing Ineffective treatments:  None tried Associated symptoms: vomiting   Emesis  Associated symptoms: abdominal pain     Past Medical History:  Diagnosis Date  . Allergy   . Anxiety   . Depression   . Migraine     Patient Active Problem List   Diagnosis Date Noted  . Gastroesophageal reflux disease 07/07/2018  . Weight gain 06/04/2018  . 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 GAD (generalized anxiety disorder) 12/13/2013  . Migraine 04/23/2013    Past Surgical History:  Procedure Laterality Date  . ADENOIDECTOMY    . TONSILLECTOMY       OB History    Gravida  1   Para      Term      Preterm      AB  1   Living  0     SAB      TAB  1   Ectopic      Multiple      Live Births               Home Medications    Prior to Admission medications   Medication Sig Start Date End Date Taking? Authorizing Provider  QUEtiapine (SEROQUEL) 25 MG tablet Take 1 tablet (25 mg total) by mouth at bedtime. Patient not taking: Reported on 07/15/2018 06/04/18   Meccariello, Solmon Ice, DO    Family History Family History  Problem Relation Age of Onset  . High blood pressure Mother   . Cancer Father   . Mental illness Father        schizophrenia  . Mental illness Maternal Grandmother        bipolar  . Cancer Maternal Grandfather   . Mental illness Paternal Grandmother   . Cancer Paternal Grandmother   . Mental illness Paternal Grandfather     Social History Social History   Tobacco Use  . Smoking status: Never Smoker  . Smokeless tobacco: Never Used  Substance Use Topics  . Alcohol use: Yes    Alcohol/week: 0.0 standard drinks    Comment: 2-3 drinks/wk  . Drug use: Not on file     Allergies   Patient has no known allergies.   Review  of Systems Review of Systems  Gastrointestinal: Positive for abdominal pain and vomiting.  All other systems reviewed and are negative.    Physical Exam Updated Vital Signs BP 103/87   Pulse 78   Temp 98.1 F (36.7 C) (Oral)   Resp 16   Ht 5\' 5"  (1.651 m)   Wt 84.4 kg   LMP 06/23/2018   SpO2 100%   BMI 30.95 kg/m   Physical Exam Vitals signs and nursing note reviewed.  Constitutional:      General: She is not in acute distress.    Appearance: She is well-developed.  HENT:     Head: Normocephalic and atraumatic.  Eyes:     Conjunctiva/sclera: Conjunctivae normal.     Pupils: Pupils are equal, round, and reactive to light.  Neck:     Musculoskeletal: Normal range of motion and neck supple.  Cardiovascular:     Rate and Rhythm: Normal rate and regular rhythm.     Heart sounds: Normal heart sounds.  Pulmonary:     Effort: Pulmonary effort is normal. No  respiratory distress.     Breath sounds: Normal breath sounds.  Abdominal:     General: Abdomen is flat. There is no distension.     Palpations: Abdomen is soft.     Tenderness: There is abdominal tenderness in the epigastric area.  Musculoskeletal: Normal range of motion.        General: No deformity.  Skin:    General: Skin is warm and dry.  Neurological:     Mental Status: She is alert and oriented to person, place, and time.      ED Treatments / Results  Labs (all labs ordered are listed, but only abnormal results are displayed) Labs Reviewed  CBC - Abnormal; Notable for the following components:      Result Value   WBC 11.6 (*)    All other components within normal limits  URINALYSIS, ROUTINE W REFLEX MICROSCOPIC - Abnormal; Notable for the following components:   Protein, ur 30 (*)    All other components within normal limits  LIPASE, BLOOD  COMPREHENSIVE METABOLIC PANEL  I-STAT BETA HCG BLOOD, ED ([facility], [facility], [facility] ONLY)    EKG None  Radiology Ct Abdomen Pelvis W Contrast  Result Date: 07/15/2018 CLINICAL DATA:  Initial evaluation for acute emesis and upper abdominal pain. EXAM: CT ABDOMEN AND PELVIS WITH CONTRAST TECHNIQUE: Multidetector CT imaging of the abdomen and pelvis was performed using the standard protocol following bolus administration of intravenous contrast. CONTRAST:  ISOVUE-300 IOPAMIDOL (ISOVUE-300) INJECTION 61% COMPARISON:  None available. FINDINGS: Lower chest: Visualized lung bases are clear. Hepatobiliary: Liver demonstrates a normal contrast enhanced appearance. Gallbladder within normal limits. No biliary dilatation. Pancreas: Pancreas within normal limits. Spleen: Spleen within normal limits. Adrenals/Urinary Tract: Adrenal glands are normal. Kidneys equal in size with symmetric enhancement. Subcentimeter hypodensity at the lower pole the right kidney too small the characterize, but statistically likely reflects a small cyst. No nephrolithiasis,  hydronephrosis, or focal enhancing renal mass. No hydroureter. Bladder largely decompressed without acute finding. Stomach/Bowel: Stomach within normal limits. No evidence for bowel obstruction. Normal appendix. No acute inflammatory changes seen about the bowels. Vascular/Lymphatic: Normal intravascular enhancement seen throughout the intra-abdominal aorta and its branch vessels. No adenopathy. Reproductive: Uterus and ovaries within normal limits for age. Other: No free air or fluid. Musculoskeletal: No acute osseous finding. No discrete lytic or blastic osseous lesions. IMPRESSION: No CT evidence for acute intra-abdominal or pelvic process. Electronically Signed  By: Rise Mu M.D.   On: 07/15/2018 18:21    Procedures Procedures (including critical care time)  Medications Ordered in ED Medications  iopamidol (ISOVUE-300) 61 % injection 100 mL (has no administration in time range)  sodium chloride flush (NS) 0.9 % injection 3 mL (3 mLs Intravenous Given 07/15/18 1656)  sodium chloride 0.9 % bolus 1,000 mL (1,000 mLs Intravenous New Bag/Given 07/15/18 1655)  ondansetron (ZOFRAN) injection 4 mg (4 mg Intravenous Given 07/15/18 1656)  ketorolac (TORADOL) 15 MG/ML injection 15 mg (15 mg Intravenous Given 07/15/18 1656)     Initial Impression / Assessment and Plan / ED Course  I have reviewed the triage vital signs and the nursing notes.  Pertinent labs & imaging results that were available during my care of the patient were reviewed by me and considered in my medical decision making (see chart for details).        MDM  Screen complete  Patient is presenting for evaluation of reported nausea, vomiting, and diarrhea.  Symptoms began acutely this morning.  Patient's abdominal exam is benign.  Patient without significant abnormality found on her screening labs.  CT imaging does not suggest significant acute pathology.  Patient does feel improved following IV fluids and antiemetics.   She does understand need for close follow-up both with her regular primary care and also possibly with GI.  Strict return precautions given and understood.  Importance of close follow-up stressed.   Final Clinical Impressions(s) / ED Diagnoses   Final diagnoses:  Generalized abdominal pain  Nausea vomiting and diarrhea    ED Discharge Orders         Ordered    ondansetron (ZOFRAN) 4 MG tablet  Every 6 hours     07/15/18 1852           Wynetta Fines, MD 07/15/18 [phone]

## 2018-07-15 NOTE — ED Triage Notes (Signed)
Pt states emesis and upper abdominal pain since 0400 today. Pt states she has thrown up "50 or 60 times" today. Pt states she has had mostly bile in her emesis.

## 2018-07-17 ENCOUNTER — Encounter: Payer: Self-pay | Admitting: Gastroenterology

## 2018-07-17 ENCOUNTER — Telehealth: Payer: Self-pay | Admitting: *Deleted

## 2018-07-17 NOTE — Telephone Encounter (Signed)
Pt calls because she went to the ED last night for abdominal pain.  They gave her zofran for the nausea but nothing else. She wants to know if she can have something for the pain.  She denies fever or constipation. Jone Baseman, CMA

## 2018-07-17 NOTE — Telephone Encounter (Signed)
Patient has called a second time.  Call back is [phone]  Ples Specter, RN [facility] RN)

## 2018-07-21 NOTE — Telephone Encounter (Signed)
Offered pt an appt but she declined and said she has a appt with gastro coming up. Deseree Bruna Potter, CMA

## 2018-07-21 NOTE — Telephone Encounter (Signed)
Hey, I'm covering for [place] this week. I reviewed her ED visit. I would recommend she try to make an appointment to be seen this week if her stomach is still bothering her and to try tylenol and/or Ibuprofen. If she feels like her stomach is getting severe, she should be evaluated at the ED again. Thanks for letting her know.

## 2018-07-29 ENCOUNTER — Telehealth (INDEPENDENT_AMBULATORY_CARE_PROVIDER_SITE_OTHER): Payer: 59 | Admitting: Gastroenterology

## 2018-07-29 ENCOUNTER — Other Ambulatory Visit: Payer: Self-pay

## 2018-07-29 NOTE — Progress Notes (Signed)
Patient did not want to proceed with telephone visit and opted out.  Advised her to contact us back if needed.

## 2018-08-06 NOTE — Progress Notes (Signed)
Patient no showed appointment

## 2018-08-07 ENCOUNTER — Telehealth: Payer: 59 | Admitting: Neurology

## 2018-08-07 ENCOUNTER — Other Ambulatory Visit: Payer: Self-pay

## 2018-08-07 ENCOUNTER — Telehealth: Payer: Self-pay

## 2018-08-07 NOTE — Telephone Encounter (Signed)
Attempted to reach Pt to confirm medications and vitals prior to visit, LMOVM

## 2018-08-12 ENCOUNTER — Encounter: Payer: Self-pay | Admitting: Gastroenterology

## 2018-08-17 ENCOUNTER — Ambulatory Visit: Payer: 59 | Admitting: Neurology

## 2018-09-21 ENCOUNTER — Other Ambulatory Visit: Payer: Self-pay

## 2018-09-21 ENCOUNTER — Encounter: Payer: Self-pay | Admitting: Gastroenterology

## 2018-09-21 ENCOUNTER — Telehealth (INDEPENDENT_AMBULATORY_CARE_PROVIDER_SITE_OTHER): Payer: 59 | Admitting: Gastroenterology

## 2018-09-21 DIAGNOSIS — R1010 Upper abdominal pain, unspecified: Secondary | ICD-10-CM

## 2018-09-21 DIAGNOSIS — R112 Nausea with vomiting, unspecified: Secondary | ICD-10-CM | POA: Diagnosis not present

## 2018-09-21 DIAGNOSIS — K219 Gastro-esophageal reflux disease without esophagitis: Secondary | ICD-10-CM

## 2018-09-21 MED ORDER — OMEPRAZOLE 20 MG PO CPDR: 20.0000 mg | DELAYED_RELEASE_CAPSULE | ORAL | 0 refills | Status: DC

## 2018-09-21 MED ORDER — OMEPRAZOLE 20 MG PO CPDR: 20.0000 mg | DELAYED_RELEASE_CAPSULE | Freq: Every day | ORAL | 11 refills | Status: DC

## 2018-09-21 NOTE — Patient Instructions (Signed)
To help prevent the possible spread of infection to our patients, communities, and staff; we will be implementing the following measures:  As of now we are not allowing any visitors/family members to accompany you to any upcoming appointments with [facility]. If you have any concerns about this please contact our office to discuss prior to the appointment.    Please go to the lab on the 2nd floor [address] on May 22nd at 2:00pm.  Prilosec medication was sent to your pharmacy   Please call us 2 months to see about setting up follow up appointment  Please call if you have any questions at [phone]

## 2018-09-21 NOTE — Progress Notes (Signed)
Chief Complaint: GERD  Referring Provider:     Unknown Jim, DO   HPI:    Due to current restrictions/limitations of in-office visits due to the COVID-19 pandemic, this scheduled clinical appointment was converted to a telehealth virtual consultation.  -Time of medical discussion: 25 minutes -The patient did consent to this virtual visit and is aware of possible charges through their insurance for this visit.  -Names of all parties present: Tracy Cohen (patient), Doristine Locks, DO, [facility] (physician) -Patient location: Home -Physician location: Office  Interactive audio and video telecommunications were attempted between this provider and patient, however failed, due to patient having technical difficulties OR patient did not have access to video capability. We continued and completed visit with audio only.  ALLE Cohen is a 28 y.o. female referred to the Gastroenterology Clinic for evaluation of GERD.  Sxs started in October 2019. She states she has pyrosis, regurgitation, waterbrash, and belching. Worse post prandial and exacerbated by spicy foods, tomato-based sauces, greasy foods, and supine. Recently started Tums prn, with some relief of sxs. Otherwise, has not tried any other medications for sxs. No dysphagia, odynophagia.   Additionally, with episodic upper abdominal pain, which has occurred 3-4 times over the last 4-5 months. No radiation. +nausea/vomiting. No melena or hematemesis. +diarrhea with those sxs. Sxs last <24 hours. Was seen in the ER on 07/15/2018 for the symptoms.  CT abdomen/pelvis in 07/2018 normal.  Aside from WBC 11.6, normal CBC, lipase, CMP, UA.  Treated with IVF, Toradol, Zofran, and discharged home with Zofran.  No previous EGD.   No known family history of CRC, GI malignancy, liver disease, pancreatic disease, or IBD.   Past medical history, past surgical history, social history, family history, medications, and allergies reviewed  in the chart and with patient.    Past Medical History:  Diagnosis Date  . Allergy   . Anxiety   . Depression   . Migraine      Past Surgical History:  Procedure Laterality Date  . ADENOIDECTOMY    . TONSILLECTOMY     Family History  Problem Relation Age of Onset  . High blood pressure Mother   . Mental illness Father        schizophrenia  . Esophageal cancer Father   . Mental illness Maternal Grandmother        bipolar  . Kidney cancer Maternal Grandmother   . Cancer Maternal Grandfather   . Mental illness Paternal Grandmother   . Cancer Paternal Grandmother   . Mental illness Paternal Grandfather   . Colon cancer Neg Hx    Social History   Tobacco Use  . Smoking status: Never Smoker  . Smokeless tobacco: Never Used  Substance Use Topics  . Alcohol use: Yes    Alcohol/week: 0.0 standard drinks    Comment: 2-3 drinks/wk  . Drug use: Not Currently   Current Outpatient Medications  Medication Sig Dispense Refill  . QUEtiapine (SEROQUEL) 25 MG tablet Take 1 tablet (25 mg total) by mouth at bedtime. (Patient not taking: Reported on 07/15/2018) 30 tablet 1   No current facility-administered medications for this visit.    No Known Allergies   Review of Systems: All systems reviewed and negative except where noted in HPI.     Physical Exam:    Physical exam not completed due to the nature of this telehealth communication.  Patient was otherwise alert and oriented  and well communicative.   ASSESSMENT AND PLAN;   1) GERD: Clinical presentation seems most consistent with reflux.  Discussed trial of high-dose acid suppression therapy for diagnostic/therapeutic intent versus endoscopic evaluation followed by PPI therapy.  Briefly discussed antireflux surgical options.  She elected to proceed as below:  -Prilosec 40 mg p.o. twice daily x4 weeks for diagnostic/therapeutic intent.  If reflux well controlled, plan to titrate to lowest effective dose by reducing to 20  mg p.o. twice daily x2 weeks, then can hopefully get to 20 mg p.o. predinner -If symptoms incompletely controlled, or if she like to consider antireflux surgery, plan for EGD to evaluate for erosive esophagitis, LES laxity, hiatal hernia, etc. -Offered EGD now to evaluate for both objective evidence of reflux as well as abdominal pain and nausea/vomiting, which she decided to hold off on for now  2) Upper abdominal pain: 3) Nausea/vomiting: -Check H. pylori -Evaluate for clinical improvement with trial of high-dose PPI as above -If ongoing symptoms, plan for EGD to evaluate for mucosal/luminal etiology  RTC in 2 months   Shellia CleverlyVito V Cirigliano, DO, FACG  09/21/2018, 8:48 AM   Meccariello, Solmon IceBailey J, *

## 2018-09-25 ENCOUNTER — Other Ambulatory Visit: Payer: 59

## 2018-09-25 ENCOUNTER — Other Ambulatory Visit (INDEPENDENT_AMBULATORY_CARE_PROVIDER_SITE_OTHER): Payer: 59

## 2018-09-25 DIAGNOSIS — R112 Nausea with vomiting, unspecified: Secondary | ICD-10-CM | POA: Diagnosis not present

## 2018-09-25 DIAGNOSIS — R1010 Upper abdominal pain, unspecified: Secondary | ICD-10-CM

## 2018-09-25 DIAGNOSIS — K219 Gastro-esophageal reflux disease without esophagitis: Secondary | ICD-10-CM

## 2018-09-25 LAB — H. PYLORI ANTIBODY, IGG: H Pylori IgG: NEGATIVE

## 2018-10-18 ENCOUNTER — Other Ambulatory Visit: Payer: Self-pay | Admitting: Gastroenterology

## 2018-11-20 ENCOUNTER — Ambulatory Visit: Payer: 59 | Admitting: Neurology

## 2018-11-22 NOTE — Progress Notes (Signed)
Virtual Visit via Video Note The purpose of this virtual visit is to provide medical care while limiting exposure to the novel coronavirus.    Consent was obtained for video visit:  Yes Answered questions that patient had about telehealth interaction:  Yes I discussed the limitations, risks, security and privacy concerns of performing an evaluation and management service by telemedicine. I also discussed with the patient that there may be a patient responsible charge related to this service. The patient expressed understanding and agreed to proceed.  Pt location: Home Physician Location: Home Name of referring provider:  Kinnie Feil, MD I connected with Tracy Cohen at patients initiation/request on 11/23/2018 at 10:30 AM EDT by video enabled telemedicine application and verified that I am speaking with the correct person using two identifiers. Pt MRN:  [id number] Pt DOB:  [birth year] Video Participants:  Tracy Cohen   History of Present Illness:  Tracy Cohen is a 28 year old female who presents for migraines.  History supplemented by referring provider note.  Onset:  Occasionally from age 45 through teenager.  Became more frequent in her mid 26s. Location:  Start bifrontal/temporal Quality:  Pounding and squeezing Intensity:  10/10.  She denies new headache, thunderclap headache  Aura:  no Premonitory Phase: no Postdrome: no Associated symptoms:  Dizziness, lightheadedness, nausea, vomiting, photophobia, phonophobia, osmophobia, vision loss (blurred vision to black), numbness in legs.  She denies associated unilateral weakness. Duration:  Usually 48 to 72 hours.  Anywhere from 1 1/2 days up to 3 months Frequency:  2 to 3 times a month (10 days a month) Frequency of abortive medication: 10 days a month Triggers:  Anything with red dye, sometimes caffeine, aged cheeses Relieving factors:  Laying down in dark and quiet room Activity:  Aggravates.  Misses 4 days of work a  monthaimo  12/08/16 MRI of brain without contrast personally reviewed and demonstrated incidental finding of cerebellar tonsils extending 4 mm below the foramen magnum but otherwise unremarkable.  Current NSAIDS: 2 Aleve Current analgesics:  none Current triptans:  none Current ergotamine:  none Current anti-emetic:  none Current muscle relaxants:  none Current anti-anxiolytic:  none Current sleep aide:  none Current Antihypertensive medications:  none Current Antidepressant/antipsychotic none Current Anticonvulsant medications:  none Current anti-CGRP:  none Current Vitamins/Herbal/Supplements:  none Current Antihistamines/Decongestants:  none Other therapy:  none Hormone/birth control:  Xulane patch  Past NSAIDS:  ibuprofen Past analgesics:  Excedrin, Tylenol Past abortive triptans:  Maxalt, Zomig, sumatriptan tablet Past abortive ergotamine:  none Past muscle relaxants:  none Past anti-emetic:  Zofran, Promethazine Past antihypertensive medications:  none Past antidepressant/antipsychotic medications:  Nortriptyline, Seroquel Past anticonvulsant medications:  topiramate Past anti-CGRP:  none Past vitamins/Herbal/Supplements:  none Past antihistamines/decongestants:  none Other past therapies:  none  Caffeine:  No Alcohol:  no Smoker:  no Diet:  Water, protein, vegetables Exercise:  Yes.  Has a trainer. Depression:  yes; Anxiety:  yes Other pain:  no Sleep hygiene:  ok Family history of headache:  Mom (migraines later in life)  Past Medical History: Past Medical History:  Diagnosis Date   Allergy    Anxiety    Depression    Migraine     Medications: Outpatient Encounter Medications as of 11/23/2018  Medication Sig   omeprazole (PRILOSEC) 20 MG capsule Take 1 capsule (20 mg total) by mouth as directed. Take 2 capsule (40 mg total) 2 times daily for 4 weeks then decrease to 1 capsule (20 mg total)  2 times daily for 2 weeks   omeprazole (PRILOSEC) 20 MG  capsule Take 1 capsule (20 mg total) by mouth daily. Take 1 capsule (20 mg total) 30 to 60 minutes minutes before dinner   QUEtiapine (SEROQUEL) 25 MG tablet Take 1 tablet (25 mg total) by mouth at bedtime. (Patient not taking: Reported on 07/15/2018)   No facility-administered encounter medications on file as of 11/23/2018.     Allergies: No Known Allergies  Family History: Family History  Problem Relation Age of Onset   High blood pressure Mother    Mental illness Father        schizophrenia   Esophageal cancer Father    Mental illness Maternal Grandmother        bipolar   Kidney cancer Maternal Grandmother    Cancer Maternal Grandfather    Mental illness Paternal Grandmother    Cancer Paternal Grandmother    Mental illness Paternal Grandfather    Colon cancer Neg Hx     Social History: Social History   Socioeconomic History   Marital status: Single    Spouse name: Not on file   Number of children: 0   Years of education: 13   Highest education level: Not on file  Occupational History    Employer: [employer]    Comment: [employer]  Social Needs   Financial resource strain: Not on file   Food insecurity    Worry: Not on file    Inability: Not on file   Transportation needs    Medical: Not on file    Non-medical: Not on file  Tobacco Use   Smoking status: Never Smoker   Smokeless tobacco: Never Used  Substance and Sexual Activity   Alcohol use: Yes    Alcohol/week: 0.0 standard drinks    Comment: 2-3 drinks/wk   Drug use: Not Currently   Sexual activity: Yes    Birth control/protection: Condom    Comment: last intercourse July 28 2015( condom broke)  Lifestyle   Physical activity    Days per week: Not on file    Minutes per session: Not on file   Stress: Not on file  Relationships   Social connections    Talks on phone: Not on file    Gets together: Not on file    Attends religious service: Not on file    Active member of club or  organization: Not on file    Attends meetings of clubs or organizations: Not on file    Relationship status: Not on file   Intimate partner violence    Fear of current or ex partner: Not on file    Emotionally abused: Not on file    Physically abused: Not on file    Forced sexual activity: Not on file  Other Topics Concern   Not on file  Social History Narrative   Patient lives at home alone.    Patient works for [employer].   Patient has some college education.   Right handed.          Observations/Objective:   Height 5\' 4"  (1.626 m), weight 180 lb (81.6 kg). No acute distress.  Alert and oriented.  Speech fluent and not dysarthric.  Language intact.  Eyes orthophoric on primary gaze.  Face symmetric.  Assessment and Plan:   Migraine without aura, without status migrainosus, intractable  1.  For preventative management, Aimovig 70mg  monthly 2.  For abortive therapy, Nurtec 3.  Limit use of pain relievers to  no more than 2 days out of week to prevent risk of rebound or medication-overuse headache. 4.  Keep headache diary 5.  Exercise, hydration, caffeine cessation, sleep hygiene, monitor for and avoid triggers 6.  Consider:  magnesium citrate 400mg  daily, riboflavin 400mg  daily, and coenzyme Q10 100mg  three times daily 7. Always keep in mind that currently taking a hormone or birth control may be a possible trigger or aggravating factor for migraine. 8. Follow up 4 months   Follow Up Instructions:    -I discussed the assessment and treatment plan with the patient. The patient was provided an opportunity to ask questions and all were answered. The patient agreed with the plan and demonstrated an understanding of the instructions.   The patient was advised to call back or seek an in-person evaluation if the symptoms worsen or if the condition fails to improve as anticipated.   Cira ServantAdam Robert Gardner Servantes, DO

## 2018-11-23 ENCOUNTER — Telehealth (INDEPENDENT_AMBULATORY_CARE_PROVIDER_SITE_OTHER): Payer: 59 | Admitting: Neurology

## 2018-11-23 ENCOUNTER — Other Ambulatory Visit: Payer: Self-pay

## 2018-11-23 ENCOUNTER — Encounter: Payer: Self-pay | Admitting: Neurology

## 2018-11-23 VITALS — Ht 64.0 in | Wt 180.0 lb

## 2018-11-23 DIAGNOSIS — G43019 Migraine without aura, intractable, without status migrainosus: Secondary | ICD-10-CM

## 2018-11-23 MED ORDER — NURTEC 75 MG PO TBDP: 1.0000 | ORAL_TABLET | Freq: Every day | ORAL | 3 refills | Status: DC | PRN

## 2018-11-23 MED ORDER — AIMOVIG 70 MG/ML ~~LOC~~ SOAJ: 70.0000 mg | SUBCUTANEOUS | 3 refills | Status: DC

## 2018-11-23 NOTE — Patient Instructions (Signed)
1.  For preventative management, start Aimovig 70mg  injection every 30 days (inject into thigh) 2.  For abortive therapy, Nurtec as needed.  No more than 1 tablet in 24 hours 3.  Limit use of pain relievers to no more than 2 days out of week to prevent risk of rebound or medication-overuse headache. 4.  Keep headache diary 5.  Exercise, hydration, caffeine cessation, sleep hygiene, monitor for and avoid triggers 6.  Consider:  magnesium citrate 400mg  daily, riboflavin 400mg  daily, and coenzyme Q10 100mg  three times daily 7. Always keep in mind that currently taking a hormone or birth control may be a possible trigger or aggravating factor for migraine. 8. Follow up in 4 months

## 2018-12-08 ENCOUNTER — Telehealth: Payer: Self-pay | Admitting: Neurology

## 2018-12-08 NOTE — Progress Notes (Signed)
Initiated on Cover My Meds Key: [id number]   If [employer] has not responded to your request within 24 hours, contact [place] at [phone]. If you think there may be a problem with your PA request, use our live chat feature at the bottom right.  Rcvd a message in Cover My Meds, Aimovig has been approved, will receive faxed information with details.  Called and advised Pt, had to LMOVM.

## 2018-12-08 NOTE — Telephone Encounter (Signed)
Patient states that the aimovig needs prior auth and we can call [phone] to get it approved. She has been waiting for a while to get this medication

## 2018-12-08 NOTE — Telephone Encounter (Signed)
Called and advised Pt to register at [email or web address] to receive the aimovig for no more than $5 per month. I advised her also the determination from  [place] was promised within 24 hours.

## 2018-12-09 ENCOUNTER — Telehealth: Payer: Self-pay | Admitting: Neurology

## 2018-12-09 NOTE — Telephone Encounter (Signed)
Called patient back regarding her call about her first injection of Aimovig. She had just given it prior to return call and wanted to make sure she did it correctly. Reviewed steps with patient and sounded like she successfully administered the injection. For additional support directed patient to the [place] website for written instructions as well as an instructional video. Informed patient if she has questions next month she is able to call us back or set up a nurse visit as she has not been given in person instruction on this injection as office visit was telehealth appt. Verbalized understanding and no further questions at this time.

## 2018-12-09 NOTE — Telephone Encounter (Signed)
Patient was calling in wanting to know if she could have the nurse give her the Aimovig injection. She has it and is having a hard time doing it. Please call her back and let her know if someone can help her with this. Thanks!

## 2019-02-23 ENCOUNTER — Ambulatory Visit: Payer: 59 | Admitting: Family Medicine

## 2019-03-15 ENCOUNTER — Encounter: Payer: Self-pay | Admitting: *Deleted

## 2019-03-15 NOTE — Progress Notes (Addendum)
Tracy Cohen (Key: [id number]) Aimovig 70MG /ML auto-injectors   Form Charity fundraiser PA Form (NCPDP) Created 5 days ago Sent to Plan 13 minutes ago Plan Response 13 minutes ago Submit Clinical Questions less than a minute ago Determination Wait for Determination Please wait for [employer] to return a determination. Notice of Approval Date: 03/16/2019 ADAM JAFFE [address] [place], [place] [id number] Plan Member Name: Tracy Cohen Plan Member [id number] Prescriber Name: ADAM JAFFE Prescriber Phone: [phone] Prescriber Fax: [phone] Dear [name]: [employer] received a request for coverage of Aimovig 70mg /mL (erenumabaooe) for you. As long as you remain covered by your prescription drug plan and there are no changes to your plan benefits, this request is approved for the following time period: 03/16/2019 - 03/15/2020 Approvals may be limited as follows:  By dosing limits. Dosing limits may be established in accordance with FDA approved labeling, accepted compendia, evidence based practice guidelines or your prescription drug plan benefits;  By indication. For some products, coverage may be available for select indications only;  By [employer] Drug Code [facility]). Drug products are identified by unique numerical product identifiers, called NDCs, which identify the manufacturer, strength, dosage form, formulation and package size. Some NDCs may not be covered. If you have not already done so, you may ask your pharmacist to fill the prescription. If you have questions, please call Customer Care toll-free at the number on your prescription benefit ID card. Sincerely, [employer] This document contains references to brand-name prescription drugs that are trademarks or registered trademarks of pharmaceutical manufacturers not affiliated with [employer]. Your privacy is important to [place]. Our employees are trained regarding the  appropriate way to handle your private health information. [phone] TDD/TTY: [phone] cc: Dr. Quita Skye JAFFE PA# [place] [employer] [place] [phone] EC

## 2019-03-19 ENCOUNTER — Telehealth: Payer: Self-pay

## 2019-03-19 NOTE — Telephone Encounter (Addendum)
Pt drop off FMLA forms to be completed by Dr. Tomi Likens Per Dr. Tomi Likens unable to complete forms after 1st visit.  Called patient to make her aware of this no answer left message that provider does not fill out forms after first visit.   Forms place at front desk for pick up.

## 2019-03-24 ENCOUNTER — Telehealth: Payer: Self-pay | Admitting: Neurology

## 2019-03-24 NOTE — Telephone Encounter (Signed)
Called spoke with patient she was informed of provider response and ask to hold papers until her next visit on 03/29/19

## 2019-03-24 NOTE — Telephone Encounter (Signed)
Policy is that we don't fill out FMLA after first visit.  If she needs it filled now, then I defer to her PCP in the meantime.

## 2019-03-24 NOTE — Telephone Encounter (Signed)
Patient called requesting to speak with a medical assistant about her FMLA forms (up front) she was told Dr. Tomi Likens could not complete. She'd like an explanation as to why.

## 2019-03-24 NOTE — Telephone Encounter (Signed)
Please advise I believe this is the one you said not after first visit?  Not sure of the reason you decline FMLA

## 2019-03-29 ENCOUNTER — Telehealth (INDEPENDENT_AMBULATORY_CARE_PROVIDER_SITE_OTHER): Payer: 59 | Admitting: Neurology

## 2019-03-29 ENCOUNTER — Telehealth: Payer: 59 | Admitting: Neurology

## 2019-03-29 ENCOUNTER — Encounter: Payer: Self-pay | Admitting: Neurology

## 2019-03-29 ENCOUNTER — Other Ambulatory Visit: Payer: Self-pay

## 2019-03-29 VITALS — Ht 65.0 in | Wt 157.0 lb

## 2019-03-29 DIAGNOSIS — G43009 Migraine without aura, not intractable, without status migrainosus: Secondary | ICD-10-CM | POA: Diagnosis not present

## 2019-03-29 NOTE — Progress Notes (Signed)
Virtual Visit via Video Note The purpose of this virtual visit is to provide medical care while limiting exposure to the novel coronavirus.    Consent was obtained for video visit:  Yes.   Answered questions that patient had about telehealth interaction:  Yes.   I discussed the limitations, risks, security and privacy concerns of performing an evaluation and management service by telemedicine. I also discussed with the patient that there may be a patient responsible charge related to this service. The patient expressed understanding and agreed to proceed.  Pt location: Home Physician Location: office Name of referring provider:  Cleophas Dunker, * I connected with Tracy Cohen at patients initiation/request on 03/29/2019 at 10:10 AM EST by video enabled telemedicine application and verified that I am speaking with the correct person using two identifiers. Pt MRN:  [id number] Pt DOB:  [birth year] Video Participants:  Tracy Cohen   History of Present Illness:  Tracy Cohen is a 28 year old female who follows up for migraines.  UPDATE: Intensity:  7-8/10 Duration:  4 to 5 hours Frequency:  4 days a month Frequency of abortive medication: 4 days a month Current NSAIDS: 2 Aleve Current analgesics:  none Current triptans:  none Current ergotamine:  none Current anti-emetic:  none Current muscle relaxants:  none Current anti-anxiolytic:  none Current sleep aide:  none Current Antihypertensive medications:  none Current Antidepressant/antipsychotic none Current Anticonvulsant medications:  none Current anti-CGRP:  Aimovig 70mg ; Nurtec (took once) Current Vitamins/Herbal/Supplements:  none Current Antihistamines/Decongestants:  none Other therapy:  none Hormone/birth control:  Xulane patch  Caffeine:  No Alcohol:  no Smoker:  no Diet:  Water, protein, vegetables Exercise:  Yes.  Has a trainer. Depression:  yes; Anxiety:  yes Other pain:  no Sleep hygiene:  ok    HISTORY: Onset:  Occasionally from age 38 through teenager.  Became more frequent in her mid 75s. Location:  Start bifrontal/temporal Quality:  Pounding and squeezing Intensity:  10/10.  She denies new headache, thunderclap headache  Aura:  no Premonitory Phase: no Postdrome: no Associated symptoms:  Dizziness, lightheadedness, nausea, vomiting, photophobia, phonophobia, osmophobia, vision loss (blurred vision to black), numbness in legs.  She denies associated unilateral weakness. Duration:  Usually 48 to 72 hours.  Anywhere from 1 1/2 days up to 3 months Frequency:  2 to 3 times a month (10 days a month) Frequency of abortive medication: 10 days a month Triggers:  Anything with red dye, sometimes caffeine, aged cheeses Relieving factors:  Laying down in dark and quiet room Activity:  Aggravates.  Misses 4 days of work a monthaimo  12/08/16 MRI of brain without contrast personally reviewed and demonstrated incidental finding of cerebellar tonsils extending 4 mm below the foramen magnum but otherwise unremarkable.  Past NSAIDS:  ibuprofen Past analgesics:  Excedrin, Tylenol Past abortive triptans:  Maxalt, Zomig, sumatriptan tablet Past abortive ergotamine:  none Past muscle relaxants:  none Past anti-emetic:  Zofran, Promethazine Past antihypertensive medications:  none Past antidepressant/antipsychotic medications:  Nortriptyline, Seroquel Past anticonvulsant medications:  topiramate Past anti-CGRP:  none Past vitamins/Herbal/Supplements:  none Past antihistamines/decongestants:  none Other past therapies:  none   Family history of headache:  Mom (migraines later in life)  Past Medical History: Past Medical History:  Diagnosis Date  . Allergy   . Anxiety   . Depression   . Migraine     Medications: Outpatient Encounter Medications as of 03/29/2019  Medication Sig  . Erenumab-aooe (AIMOVIG) 70 MG/ML SOAJ  Inject 70 mg into the skin every 30 (thirty) days.  [name]  omeprazole (PRILOSEC) 20 MG capsule Take 1 capsule (20 mg total) by mouth as directed. Take 2 capsule (40 mg total) 2 times daily for 4 weeks then decrease to 1 capsule (20 mg total) 2 times daily for 2 weeks  . Rimegepant Sulfate (NURTEC) 75 MG TBDP Take 1 tablet by mouth daily as needed (Maximum 1 tablet in 24 hours).  . [DISCONTINUED] omeprazole (PRILOSEC) 20 MG capsule Take 1 capsule (20 mg total) by mouth daily. Take 1 capsule (20 mg total) 30 to 60 minutes minutes before dinner (Patient not taking: Reported on 03/29/2019)  . [DISCONTINUED] Burr Medico 150-35 MCG/24HR transdermal patch 1 (ONE) PATCH WEEKLY FOR 3 WEEKS AS DIRECTED   No facility-administered encounter medications on file as of 03/29/2019.     Allergies: No Known Allergies  Family History: Family History  Problem Relation Age of Onset  . High blood pressure Mother   . Mental illness Father        schizophrenia  . Esophageal cancer Father   . Mental illness Maternal Grandmother        bipolar  . Kidney cancer Maternal Grandmother   . Cancer Maternal Grandfather   . Mental illness Paternal Grandmother   . Cancer Paternal Grandmother   . Mental illness Paternal Grandfather   . Colon cancer Neg Hx     Social History: Social History   Socioeconomic History  . Marital status: Single    Spouse name: Not on file  . Number of children: 0  . Years of education: 23  . Highest education level: Associate degree: occupational, Scientist, product/process development, or vocational program  Occupational History    Employer: [employer]  Social Needs  . Financial resource strain: Not on file  . Food insecurity    Worry: Not on file    Inability: Not on file  . Transportation needs    Medical: Not on file    Non-medical: Not on file  Tobacco Use  . Smoking status: Never Smoker  . Smokeless tobacco: Never Used  Substance and Sexual Activity  . Alcohol use: Yes    Alcohol/week: 0.0 standard drinks    Comment: 2-3 drinks/wk  . Drug use: Not  Currently  . Sexual activity: Yes    Birth control/protection: Condom, Patch    Comment: last intercourse July 28 2015( condom broke)  Lifestyle  . Physical activity    Days per week: Not on file    Minutes per session: Not on file  . Stress: Not on file  Relationships  . Social Musician on phone: Not on file    Gets together: Not on file    Attends religious service: Not on file    Active member of club or organization: Not on file    Attends meetings of clubs or organizations: Not on file    Relationship status: Not on file  . Intimate partner violence    Fear of current or ex partner: Not on file    Emotionally abused: Not on file    Physically abused: Not on file    Forced sexual activity: Not on file  Other Topics Concern  . Not on file  Social History Narrative   Patient lives at home alone.    Patient works for [employer]   Patient has some college education.   Right handed.      Patient now works for [employer] and has received  an associated an degree.       Observations/Objective:    No acute distress.  Alert and oriented.  Speech fluent and not dysarthric.  Language intact.  Eyes orthophoric on primary gaze.  Face symmetric.  Assessment and Plan:   Migraine without aura, without status migrainosus, not intractable  1.  For preventative management, Aimovig 70mg  monthly 2.  For abortive therapy, Nurtec 3.  Limit use of pain relievers to no more than 2 days out of week to prevent risk of rebound or medication-overuse headache. 4.  Keep headache diary 5.  Exercise, hydration, caffeine cessation, sleep hygiene, monitor for and avoid triggers 6.  Consider:  magnesium citrate 400mg  daily, riboflavin 400mg  daily, and coenzyme Q10 100mg  three times daily 7. Follow up 4 months   Follow Up Instructions:    -I discussed the assessment and treatment plan with the patient. The patient was provided an opportunity to ask questions and all were answered. The  patient agreed with the plan and demonstrated an understanding of the instructions.   The patient was advised to call back or seek an in-person evaluation if the symptoms worsen or if the condition fails to improve as anticipated.    Cira ServantAdam Robert Jaffe, DO

## 2019-04-05 ENCOUNTER — Telehealth: Payer: Self-pay | Admitting: Neurology

## 2019-04-05 NOTE — Telephone Encounter (Signed)
Completed except she needs to give me a list of her job requirements.

## 2019-04-05 NOTE — Telephone Encounter (Signed)
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

## 2019-04-05 NOTE — Telephone Encounter (Signed)
Patient is calling in about her FMLA paperwork. She is wanting to know if this has been completed yet. Please call her with that update. Thanks!

## 2019-04-05 NOTE — Telephone Encounter (Signed)
Form in your folder for completion. Has this be completed?

## 2019-04-06 ENCOUNTER — Telehealth: Payer: 59 | Admitting: Neurology

## 2019-04-06 NOTE — Telephone Encounter (Signed)
Done

## 2019-04-13 ENCOUNTER — Other Ambulatory Visit: Payer: Self-pay | Admitting: Neurology

## 2019-07-29 ENCOUNTER — Ambulatory Visit (INDEPENDENT_AMBULATORY_CARE_PROVIDER_SITE_OTHER): Payer: 59 | Admitting: Adult Health

## 2019-07-29 ENCOUNTER — Other Ambulatory Visit: Payer: Self-pay

## 2019-07-29 ENCOUNTER — Encounter: Payer: Self-pay | Admitting: Adult Health

## 2019-07-29 VITALS — BP 117/75 | HR 74 | Ht 65.0 in | Wt 160.0 lb

## 2019-07-29 DIAGNOSIS — F331 Major depressive disorder, recurrent, moderate: Secondary | ICD-10-CM | POA: Diagnosis not present

## 2019-07-29 DIAGNOSIS — F4010 Social phobia, unspecified: Secondary | ICD-10-CM | POA: Diagnosis not present

## 2019-07-29 DIAGNOSIS — F411 Generalized anxiety disorder: Secondary | ICD-10-CM | POA: Diagnosis not present

## 2019-07-29 DIAGNOSIS — G4700 Insomnia, unspecified: Secondary | ICD-10-CM

## 2019-07-29 MED ORDER — FLUOXETINE HCL 10 MG PO CAPS: ORAL_CAPSULE | ORAL | 2 refills | Status: DC

## 2019-07-29 MED ORDER — ALPRAZOLAM 0.25 MG PO TABS: 0.2500 mg | ORAL_TABLET | Freq: Two times a day (BID) | ORAL | 2 refills | Status: DC | PRN

## 2019-07-29 NOTE — Addendum Note (Signed)
Addended by: Dorothyann Gibbs on: 07/29/2019 08:51 AM   Modules accepted: Orders

## 2019-07-29 NOTE — Progress Notes (Signed)
[facility] MD/PA/NP Initial Note  07/29/2019 8:33 AM MAN BONNEAU  MRN:  [id number]  Chief Complaint:   HPI:   Describes mood today as "not the best". Pleasant. Flat. Tearful. Mood symptoms - reporting depression, anxiety, and irritability. Stating "I'm having a lot of anxiety". Stating "If I go to a store is crowded, she will drive to another store even if it's across town". Stating "I'm nervous about being around people. Increased stressors over past few months - displaced from home in September, mother sick with PNA and hospitalized, grandmother recently had a stroke, Father with dementia. Has been trying to take care of herself and family. Feels overwhelmed at work - started a new job at work and "does not feel like she was trained properly to do a good job". Has been calling out of work because she can't do her job. Stating "if I can't figure out this new job, I will have to leave". Also stating "maybe if I wasn't so worried about everything else, I could learn my job". Would like to to see a therapist - "needs some help figuring things out", "too many things going on I can't control". Stating "I'm not able to get ahead or progress". Is willing to "try anything that may help". Decreased interest and motivation. Taking medications as prescribed.  Energy levels lower - "I don't have that much energy". Active, exercises twice a week. Typically works full-time at [employer], but has been out of work for 3 weeks. Has been using vacation and sick time. Feels like job and life is stressful. Unable to enjoy usual interests and activities. Single. Not dating. Staying with sister. No children. No pets. Family in the area - mother and brother. Spending time with family. Appetite adequate. Weight gain - had lost weight.  Sleeping difficulties since last September. Averages 4 hours. Focus and concentration difficulties. Completing tasks. Managing aspects of household. Works full time at [employer] [place] -  1.5 years.  Denies SI or HI. Denies AH or VH. Denies impulsivity, substance use, racing thoughts, or excessive spending.  Previous medication trials: Xanax, Citalopram, Wellbutrin,    Visit Diagnosis:    ICD-10-CM   1. Generalized anxiety disorder  F41.1   2. Major depressive disorder, recurrent episode, moderate (HCC)  F33.1   3. Social anxiety disorder  F40.10   4. Insomnia, unspecified type  G47.00     Past Psychiatric History: Admission in 2007 - teenager.   Past Medical History:  Past Medical History:  Diagnosis Date  . Allergy   . Anxiety   . Depression   . Migraine     Past Surgical History:  Procedure Laterality Date  . ADENOIDECTOMY    . TONSILLECTOMY      Family Psychiatric History: Father Schizophrenia. Grandmother bipolar.   Family History:  Family History  Problem Relation Age of Onset  . High blood pressure Mother   . Mental illness Father        schizophrenia  . Esophageal cancer Father   . Mental illness Maternal Grandmother        bipolar  . Kidney cancer Maternal Grandmother   . Cancer Maternal Grandfather   . Mental illness Paternal Grandmother   . Cancer Paternal Grandmother   . Mental illness Paternal Grandfather   . Colon cancer Neg Hx     Social History:  Social History   Socioeconomic History  . Marital status: Single    Spouse name: Not on file  . Number of  children: 0  . Years of education: 20  . Highest education level: Associate degree: occupational, Scientist, product/process development, or vocational program  Occupational History    Employer: [employer]  Tobacco Use  . Smoking status: Smoker, Current Status Unknown  . Smokeless tobacco: Never Used  Substance and Sexual Activity  . Alcohol use: Yes    Alcohol/week: 0.0 standard drinks    Comment: 2-3 drinks/wk  . Drug use: Not Currently  . Sexual activity: Yes    Birth control/protection: Condom, Patch    Comment: last intercourse July 28 2015( condom broke)  Other Topics Concern  . Not  on file  Social History Narrative   Patient lives at home alone.    Patient works for [employer]   Patient has some college education.   Right handed.      Patient now works for [employer] and has received an associated an degree.      Social Determinants of Health   Financial Resource Strain:   . Difficulty of Paying Living Expenses:   Food Insecurity:   . Worried About Programme researcher, broadcasting/film/video in the Last Year:   . Barista in the Last Year:   Transportation Needs:   . Freight forwarder (Medical):   [name] Lack of Transportation (Non-Medical):   Physical Activity:   . Days of Exercise per Week:   . Minutes of Exercise per Session:   Stress:   . Feeling of Stress :   Social Connections:   . Frequency of Communication with Friends and Family:   . Frequency of Social Gatherings with Friends and Family:   . Attends Religious Services:   . Active Member of Clubs or Organizations:   . Attends Banker Meetings:   [name] Marital Status:     Allergies: No Known Allergies  Metabolic Disorder Labs: No results found for: HGBA1C, MPG No results found for: PROLACTIN No results found for: CHOL, TRIG, HDL, CHOLHDL, VLDL, LDLCALC Lab Results  Component Value Date   TSH 0.617 06/04/2018    Therapeutic Level Labs: No results found for: LITHIUM No results found for: VALPROATE No components found for:  CBMZ  Current Medications: Current Outpatient Medications  Medication Sig Dispense Refill  . AIMOVIG 70 MG/ML SOAJ INJECT 70 MG INTO THE SKIN EVERY 30 (THIRTY) DAYS. 3 pen 3  . omeprazole (PRILOSEC) 20 MG capsule Take 1 capsule (20 mg total) by mouth as directed. Take 2 capsule (40 mg total) 2 times daily for 4 weeks then decrease to 1 capsule (20 mg total) 2 times daily for 2 weeks 140 capsule 0  . Rimegepant Sulfate (NURTEC) 75 MG TBDP Take 1 tablet by mouth daily as needed (Maximum 1 tablet in 24 hours). 8 tablet 3  . XULANE 150-35 MCG/24HR transdermal patch 1 (ONE) PATCH  WEEKLY FOR 3 WEEKS AS DIRECTED     No current facility-administered medications for this visit.    Medication Side Effects: none  Orders placed this visit:  No orders of the defined types were placed in this encounter.   Psychiatric Specialty Exam:  Review of Systems  Musculoskeletal: Negative for gait problem.  Neurological: Negative for tremors.  Psychiatric/Behavioral:       Please refer to HPI    Blood pressure 117/75, pulse 74, height 5\' 5"  (1.651 m), weight 160 lb (72.6 kg).Body mass index is 26.63 kg/m.  General Appearance: Neat and Well Groomed  Eye Contact:  Good  Speech:  Clear and Coherent and  Normal Rate  Volume:  Normal  Mood:  Anxious, Depressed and Irritable  Affect:  Appropriate and Congruent  Thought Process:  Coherent and Descriptions of Associations: Intact  Orientation:  Full (Time, Place, and Person)  Thought Content: Logical   Suicidal Thoughts:  No  Homicidal Thoughts:  No  Memory:  WNL  Judgement:  Good  Insight:  Good  Psychomotor Activity:  Normal  Concentration:  Concentration: Fair  Recall:  Good  Fund of Knowledge: Good  Language: Good  Assets:  Communication Skills Desire for Improvement Financial Resources/Insurance Housing Intimacy Leisure Time Physical Health Resilience Social Support Talents/Skills Transportation Vocational/Educational  ADL's:  Intact  Cognition: WNL  Prognosis:  Good   Screenings:  GAD-7     Office Visit from 07/07/2018 in [facility] Office Visit from 06/04/2018 in [facility]  Total GAD-7 Score  5  19    PHQ2-9     Office Visit from 06/04/2018 in [facility] Office Visit from 05/31/2017 in [facility] Total Score  4  4  PHQ-9 Total Score  19  11      Receiving Psychotherapy: No   Treatment Plan/Recommendations:   Plan:  PDMP reviewed  1. Add Xanax 0.25mg  BID for panic/anxiety/sleep 2. Add Prozac 10mg  daily x 7  days, then 20mg  daily 3. Set up with therapist   Unable to work at this time. Totally disabled 07/29/2019 through 08/26/2019. Will complete disability paperwork when received.   RTC 4 weeks  Patient advised to contact office with any questions, adverse effects, or acute worsening in signs and symptoms.      Aloha Gell, NP

## 2019-08-02 DIAGNOSIS — Z0289 Encounter for other administrative examinations: Secondary | ICD-10-CM

## 2019-08-09 ENCOUNTER — Telehealth: Payer: Self-pay | Admitting: Adult Health

## 2019-08-09 NOTE — Telephone Encounter (Signed)
Pt called to advise provider she will be recieveing paper work for pt LOA paperwork.Next appt 4/22

## 2019-08-09 NOTE — Telephone Encounter (Signed)
Noted  

## 2019-08-09 NOTE — Telephone Encounter (Signed)
FYI

## 2019-08-10 ENCOUNTER — Encounter: Payer: Self-pay | Admitting: Neurology

## 2019-08-10 NOTE — Progress Notes (Signed)
Virtual Visit via Video Note The purpose of this virtual visit is to provide medical care while limiting exposure to the novel coronavirus.    Consent was obtained for video visit:  Yes.   Answered questions that patient had about telehealth interaction:  Yes.   I discussed the limitations, risks, security and privacy concerns of performing an evaluation and management service by telemedicine. I also discussed with the patient that there may be a patient responsible charge related to this service. The patient expressed understanding and agreed to proceed.  Pt location: Home Physician Location: office Name of referring provider:  Unknown Jim, * I connected with Tracy Cohen at patients initiation/request on 08/11/2019 at  9:50 AM EDT by video enabled telemedicine application and verified that I am speaking with the correct person using two identifiers. Pt MRN:  [id number] Pt DOB:  04/10/1991 Video Participants:  Tracy Cohen   History of Present Illness:  Tracy Cohen is a 29 year old female who follows up for migraines.  UPDATE: Intensity:  7-8/10 Duration:  3 to 5 hours but mostly not severe Frequency:  2 days a month Frequency of abortive medication: 4 days a month Current NSAIDS:2 Aleve Current analgesics:none Current triptans:none Current ergotamine:none Current anti-emetic:none Current muscle relaxants:none Current anti-anxiolytic:none Current sleep aide:none Current Antihypertensive medications:none Current Antidepressant/antipsychotic none Current Anticonvulsant medications:none Current anti-CGRP:Aimovig 70mg ; Nurtec  Current Vitamins/Herbal/Supplements:none Current Antihistamines/Decongestants:none Other therapy:none Hormone/birth control:Xulane patch  Caffeine:No Alcohol:no Smoker:no Diet:Water, protein, vegetables Exercise:Yes. Has a trainer. Depression:yes; Anxiety:yes Other pain:no Sleep  hygiene:ok   HISTORY: Onset:Occasionallyfrom age 70 through teenager. Became more frequent in her mid 27s. Location:Start bifrontal/temporal Quality:Pounding and squeezing Intensity:10/10. Shedenies new headache, thunderclap headache  Aura:no Premonitory Phase:no Postdrome:no Associated symptoms:Dizziness, lightheadedness, nausea, vomiting, photophobia, phonophobia, osmophobia, vision loss (blurred vision to black), numbness in legs.Shedenies associated unilateral weakness. Duration:Usually 48 to 72 hours. Anywhere from 1 1/2 days up to 3 months Frequency:2 to 3 times a month (10 days a month) Frequency of abortive medication:10 days a month Triggers:Anything with red dye, sometimes caffeine, aged cheeses Relieving factors:Laying down in dark and quiet room Activity:Aggravates. Misses 4 days of work a monthaimo  12/08/16 MRI of brain without contrast personally reviewed and demonstrated incidental finding of cerebellar tonsils extending 4 mm below the foramen magnum but otherwise unremarkable.  Past NSAIDS:ibuprofen Past analgesics:Excedrin, Tylenol Past abortive triptans:Maxalt, Zomig, sumatriptantablet Past abortive ergotamine:none Past muscle relaxants:none Past anti-emetic:Zofran, Promethazine Past antihypertensive medications:none Past antidepressant/antipsychoticmedications: Nortriptyline, Seroquel Past anticonvulsant medications:topiramate Past anti-CGRP:none Past vitamins/Herbal/Supplements:none Past antihistamines/decongestants:none Other past therapies:none   Family history of headache:Mom (migraines later in life)  Past Medical History: Past Medical History:  Diagnosis Date  . Allergy   . Anxiety   . Depression   . Migraine     Medications: Outpatient Encounter Medications as of 08/11/2019  Medication Sig  . AIMOVIG 70 MG/ML SOAJ INJECT 70 MG INTO THE SKIN EVERY 30 (THIRTY) DAYS.  10/11/2019  ALPRAZolam (XANAX) 0.25 MG tablet Take 1 tablet (0.25 mg total) by mouth 2 (two) times daily as needed for anxiety.  [name] FLUoxetine (PROZAC) 10 MG capsule Take one capsule daily for 7 days, then take two capsules daily.  [name] omeprazole (PRILOSEC) 20 MG capsule Take 1 capsule (20 mg total) by mouth as directed. Take 2 capsule (40 mg total) 2 times daily for 4 weeks then decrease to 1 capsule (20 mg total) 2 times daily for 2 weeks  . Rimegepant Sulfate (NURTEC) 75 MG TBDP Take 1 tablet by  mouth daily as needed (Maximum 1 tablet in 24 hours).  Marilu Favre 150-35 MCG/24HR transdermal patch 1 (ONE) PATCH WEEKLY FOR 3 WEEKS AS DIRECTED   No facility-administered encounter medications on file as of 08/11/2019.    Allergies: No Known Allergies  Family History: Family History  Problem Relation Age of Onset  . High blood pressure Mother   . Mental illness Father        schizophrenia  . Esophageal cancer Father   . Mental illness Maternal Grandmother        bipolar  . Kidney cancer Maternal Grandmother   . Cancer Maternal Grandfather   . Mental illness Paternal Grandmother   . Cancer Paternal Grandmother   . Mental illness Paternal Grandfather   . Colon cancer Neg Hx     Social History: Social History   Socioeconomic History  . Marital status: Single    Spouse name: Not on file  . Number of children: 0  . Years of education: 64  . Highest education level: Associate degree: occupational, Hotel manager, or vocational program  Occupational History    Employer: [employer]  Tobacco Use  . Smoking status: Smoker, Current Status Unknown  . Smokeless tobacco: Never Used  Substance and Sexual Activity  . Alcohol use: Yes    Alcohol/week: 0.0 standard drinks    Comment: 2-3 drinks/wk  . Drug use: Not Currently  . Sexual activity: Yes    Birth control/protection: Condom, Patch    Comment: last intercourse July 28 2015( condom broke)  Other Topics Concern  . Not on file  Social History  Narrative   Patient lives at home alone.    Patient works for [employer].   Patient has some college education.   Right handed.      Patient now works for [employer] and has received an associated an degree.      Social Determinants of Health   Financial Resource Strain:   . Difficulty of Paying Living Expenses:   Food Insecurity:   . Worried About Charity fundraiser in the Last Year:   . Arboriculturist in the Last Year:   Transportation Needs:   . Film/video editor (Medical):   [name] Lack of Transportation (Non-Medical):   Physical Activity:   . Days of Exercise per Week:   . Minutes of Exercise per Session:   Stress:   . Feeling of Stress :   Social Connections:   . Frequency of Communication with Friends and Family:   . Frequency of Social Gatherings with Friends and Family:   . Attends Religious Services:   . Active Member of Clubs or Organizations:   . Attends Archivist Meetings:   [name] Marital Status:   Intimate Partner Violence:   . Fear of Current or Ex-Partner:   . Emotionally Abused:   [name] Physically Abused:   . Sexually Abused:     Observations/Objective:   Weight 165 lb (74.8 kg). No acute distress.  Alert and oriented.  Speech fluent and not dysarthric.  Language intact.  Eyes orthophoric on primary gaze.  Face symmetric.  Assessment and Plan:   Migraine without aura, without status migrainosus, not intractable  1.  For preventative management, Aimovig 70mg  2.  For abortive therapy, Nurtec 3.  Limit use of pain relievers to no more than 2 days out of week to prevent risk of rebound or medication-overuse headache. 4.  Keep headache diary 5.  Exercise, hydration, caffeine cessation, sleep hygiene,  monitor for and avoid triggers 6. Follow up 6 months   Follow Up Instructions:    -I discussed the assessment and treatment plan with the patient. The patient was provided an opportunity to ask questions and all were answered. The patient agreed with the  plan and demonstrated an understanding of the instructions.   The patient was advised to call back or seek an in-person evaluation if the symptoms worsen or if the condition fails to improve as anticipated.      Cira Servant, DO

## 2019-08-11 ENCOUNTER — Telehealth (INDEPENDENT_AMBULATORY_CARE_PROVIDER_SITE_OTHER): Payer: 59 | Admitting: Neurology

## 2019-08-11 ENCOUNTER — Other Ambulatory Visit: Payer: Self-pay

## 2019-08-11 VITALS — Wt 165.0 lb

## 2019-08-11 DIAGNOSIS — G43009 Migraine without aura, not intractable, without status migrainosus: Secondary | ICD-10-CM | POA: Diagnosis not present

## 2019-08-26 ENCOUNTER — Ambulatory Visit: Payer: 59 | Admitting: Adult Health

## 2019-09-06 ENCOUNTER — Ambulatory Visit: Payer: 59 | Admitting: Addiction (Substance Use Disorder)

## 2019-09-07 ENCOUNTER — Ambulatory Visit (INDEPENDENT_AMBULATORY_CARE_PROVIDER_SITE_OTHER): Payer: 59 | Admitting: Adult Health

## 2019-09-07 ENCOUNTER — Encounter: Payer: Self-pay | Admitting: Adult Health

## 2019-09-07 ENCOUNTER — Other Ambulatory Visit: Payer: Self-pay

## 2019-09-07 DIAGNOSIS — F4010 Social phobia, unspecified: Secondary | ICD-10-CM

## 2019-09-07 DIAGNOSIS — F411 Generalized anxiety disorder: Secondary | ICD-10-CM

## 2019-09-07 DIAGNOSIS — F331 Major depressive disorder, recurrent, moderate: Secondary | ICD-10-CM

## 2019-09-07 DIAGNOSIS — G4700 Insomnia, unspecified: Secondary | ICD-10-CM

## 2019-09-07 MED ORDER — FLUOXETINE HCL 10 MG PO CAPS: ORAL_CAPSULE | ORAL | 2 refills | Status: DC

## 2019-09-07 MED ORDER — ALPRAZOLAM 0.25 MG PO TABS: 0.2500 mg | ORAL_TABLET | Freq: Two times a day (BID) | ORAL | 2 refills | Status: DC | PRN

## 2019-09-07 NOTE — Progress Notes (Signed)
Tracy Cohen [id number] 1991/03/06 29 y.o.  Subjective:   Patient ID:  Tracy Cohen is a 29 y.o. (DOB [birth year]) female.  Chief Complaint: No chief complaint on file.  HPI   [name] presents to the office today for follow-up of MDD, GAD, insomina, and SAD.   Describes mood today as "ok". Pleasant.  Mood symptoms - decreased depression, anxiety, and irritability. Stating "things are better than they were". Feels like addition of Prozac and Xanax has helped. Stating "I feel like it is at a good dose". Stating "the medication is keeping my thoughts down". More task oriented. Returned to work a month ago. Stating "I wasn't ready to return. Has not gotten the "training" she wanted. Grandmother still in hospital. Mother recovering at home. Father lives in assisted living. Would like to to see a therapist. Varying interest and motivation. Taking medications as prescribed.  Energy levels "lower". Active, going to gym 2 days a week and exercising from home. Works full-time  Enjoys some usual interests and activities. Single. Lives alone. Family local. Spending time with family. Talking to friends.  Appetite adequate. Weight stable. Sleeps well most nights. Averages 5 to 6 hours. Focus and concentration stable. Completing tasks. Managing aspects of household. Work going "ok". Denies SI or HI. Denies AH or VH. Denies impulsivity, substance use, racing thoughts, or excessive spending.  Previous medication trials: Denies  Previous medication trials: Xanax, Citalopram, Wellbutrin,     GAD-7     Office Visit from 07/07/2018 in [facility] Office Visit from 06/04/2018 in [facility]  Total GAD-7 Score  5  19    PHQ2-9     Office Visit from 06/04/2018 in [facility] Office Visit from 05/31/2017 in [facility] Total Score  4  4  PHQ-9 Total Score  19  11       Review of Systems:  Review of Systems   Musculoskeletal: Negative for gait problem.  Neurological: Negative for tremors.  Psychiatric/Behavioral:       Please refer to HPI    Medications: I have reviewed the patient's current medications.  Current Outpatient Medications  Medication Sig Dispense Refill  . AIMOVIG 70 MG/ML SOAJ INJECT 70 MG INTO THE SKIN EVERY 30 (THIRTY) DAYS. 3 pen 3  . ALPRAZolam (XANAX) 0.25 MG tablet Take 1 tablet (0.25 mg total) by mouth 2 (two) times daily as needed for anxiety. 60 tablet 2  . cetirizine (ZYRTEC) 5 MG tablet Take 5 mg by mouth daily.    [name] FLUoxetine (PROZAC) 10 MG capsule Take one capsule daily for 7 days, then take two capsules daily. 60 capsule 2  . omeprazole (PRILOSEC) 20 MG capsule Take 1 capsule (20 mg total) by mouth as directed. Take 2 capsule (40 mg total) 2 times daily for 4 weeks then decrease to 1 capsule (20 mg total) 2 times daily for 2 weeks 140 capsule 0  . Rimegepant Sulfate (NURTEC) 75 MG TBDP Take 1 tablet by mouth daily as needed (Maximum 1 tablet in 24 hours). 8 tablet 3  . XULANE 150-35 MCG/24HR transdermal patch 1 (ONE) PATCH WEEKLY FOR 3 WEEKS AS DIRECTED     No current facility-administered medications for this visit.    Medication Side Effects: None  Allergies: No Known Allergies  Past Medical History:  Diagnosis Date  . Allergy   . Anxiety   . Depression   . Migraine  Family History  Problem Relation Age of Onset  . High blood pressure Mother   . Mental illness Father        schizophrenia  . Esophageal cancer Father   . Mental illness Maternal Grandmother        bipolar  . Kidney cancer Maternal Grandmother   . Cancer Maternal Grandfather   . Mental illness Paternal Grandmother   . Cancer Paternal Grandmother   . Mental illness Paternal Grandfather   . Colon cancer Neg Hx     Social History   Socioeconomic History  . Marital status: Single    Spouse name: Not on file  . Number of children: 0  . Years of education: 20  . Highest  education level: Associate degree: occupational, Scientist, product/process development, or vocational program  Occupational History    Employer: [employer]  Tobacco Use  . Smoking status: Smoker, Current Status Unknown  . Smokeless tobacco: Never Used  Substance and Sexual Activity  . Alcohol use: Yes    Alcohol/week: 0.0 standard drinks    Comment: 2-3 drinks/wk  . Drug use: Not Currently  . Sexual activity: Yes    Birth control/protection: Condom, Patch    Comment: last intercourse July 28 2015( condom broke)  Other Topics Concern  . Not on file  Social History Narrative   Patient lives at home alone.    Patient works for [employer]   Patient has some college education.   Right handed.      Patient now works for [employer] and has received an associated an degree.      Social Determinants of Health   Financial Resource Strain:   . Difficulty of Paying Living Expenses:   Food Insecurity:   . Worried About Programme researcher, broadcasting/film/video in the Last Year:   . Barista in the Last Year:   Transportation Needs:   . Freight forwarder (Medical):   [name] Lack of Transportation (Non-Medical):   Physical Activity:   . Days of Exercise per Week:   . Minutes of Exercise per Session:   Stress:   . Feeling of Stress :   Social Connections:   . Frequency of Communication with Friends and Family:   . Frequency of Social Gatherings with Friends and Family:   . Attends Religious Services:   . Active Member of Clubs or Organizations:   . Attends Banker Meetings:   [name] Marital Status:   Intimate Partner Violence:   . Fear of Current or Ex-Partner:   . Emotionally Abused:   [name] Physically Abused:   . Sexually Abused:     Past Medical History, Surgical history, Social history, and Family history were reviewed and updated as appropriate.   Please see review of systems for further details on the patient's review from today.   Objective:   Physical Exam:  There were no vitals taken for this  visit.  Physical Exam Constitutional:      General: She is not in acute distress. Musculoskeletal:        General: No deformity.  Neurological:     Mental Status: She is alert and oriented to person, place, and time.     Coordination: Coordination normal.  Psychiatric:        Attention and Perception: Attention and perception normal. She does not perceive auditory or visual hallucinations.        Mood and Affect: Mood is anxious and depressed. Affect is not labile, blunt, angry or  inappropriate.        Speech: Speech normal.        Behavior: Behavior normal.        Thought Content: Thought content normal. Thought content is not paranoid or delusional. Thought content does not include homicidal or suicidal ideation. Thought content does not include homicidal or suicidal plan.        Cognition and Memory: Cognition and memory normal.        Judgment: Judgment normal.     Comments: Insight intact     Lab Review:     Component Value Date/Time   NA 143 07/15/2018 1259   NA 141 06/04/2018 1441   K 3.9 07/15/2018 1259   CL 108 07/15/2018 1259   CO2 25 07/15/2018 1259   GLUCOSE 96 07/15/2018 1259   BUN 11 07/15/2018 1259   BUN 8 06/04/2018 1441   CREATININE 0.69 07/15/2018 1259   CALCIUM 9.3 07/15/2018 1259   PROT 8.0 07/15/2018 1259   PROT 7.2 06/04/2018 1441   ALBUMIN 4.4 07/15/2018 1259   ALBUMIN 4.3 06/04/2018 1441   AST 24 07/15/2018 1259   ALT 18 07/15/2018 1259   ALKPHOS 58 07/15/2018 1259   BILITOT 0.4 07/15/2018 1259   BILITOT <0.2 06/04/2018 1441   GFRNONAA >60 07/15/2018 1259   GFRAA >60 07/15/2018 1259       Component Value Date/Time   WBC 11.6 (H) 07/15/2018 1259   RBC 4.69 07/15/2018 1259   HGB 12.8 07/15/2018 1259   HGB 12.5 06/04/2018 1441   HCT 40.9 07/15/2018 1259   HCT 39.1 06/04/2018 1441   PLT 317 07/15/2018 1259   PLT 340 06/04/2018 1441   MCV 87.2 07/15/2018 1259   MCV 82 06/04/2018 1441   MCH 27.3 07/15/2018 1259   MCHC 31.3 07/15/2018 1259    RDW 14.6 07/15/2018 1259   RDW 12.5 06/04/2018 1441   LYMPHSABS 1.3 04/08/2013 1358   MONOABS 0.5 04/08/2013 1358   EOSABS 0.0 04/08/2013 1358   BASOSABS 0.0 04/08/2013 1358    No results found for: POCLITH, LITHIUM   No results found for: PHENYTOIN, PHENOBARB, VALPROATE, CBMZ   .res Assessment: Plan:    Plan:  PDMP reviewed  1. Xanax 0.25mg  BID for panic/anxiety/sleep 2. Prozac 20mg  daily 3. Set up with therapist   Patient has returned to work.   RTC 4 weeks  Patient advised to contact office with any questions, adverse effects, or acute worsening in signs and symptoms.  Discussed potential benefits, risk, and side effects of benzodiazepines to include potential risk of tolerance and dependence, as well as possible drowsiness.  Advised patient not to drive if experiencing drowsiness and to take lowest possible effective dose to minimize risk of dependence and tolerance.   There are no diagnoses linked to this encounter.   Please see After Visit Summary for patient specific instructions.  Future Appointments  Date Time Provider [place]  09/16/2019  5:00 PM Shanon Ace, LCSW [facility] None  02/14/2020 10:10 AM Pieter Partridge, DO [facility] None    No orders of the defined types were placed in this encounter.   -------------------------------

## 2019-09-16 ENCOUNTER — Ambulatory Visit: Payer: 59 | Admitting: Psychiatry

## 2019-09-20 ENCOUNTER — Encounter: Payer: Self-pay | Admitting: Adult Health

## 2019-09-20 ENCOUNTER — Ambulatory Visit (INDEPENDENT_AMBULATORY_CARE_PROVIDER_SITE_OTHER): Payer: 59 | Admitting: Adult Health

## 2019-09-20 ENCOUNTER — Telehealth: Payer: Self-pay

## 2019-09-20 ENCOUNTER — Other Ambulatory Visit: Payer: Self-pay

## 2019-09-20 DIAGNOSIS — F411 Generalized anxiety disorder: Secondary | ICD-10-CM

## 2019-09-20 DIAGNOSIS — F331 Major depressive disorder, recurrent, moderate: Secondary | ICD-10-CM

## 2019-09-20 DIAGNOSIS — F410 Panic disorder [episodic paroxysmal anxiety] without agoraphobia: Secondary | ICD-10-CM | POA: Diagnosis not present

## 2019-09-20 DIAGNOSIS — G4700 Insomnia, unspecified: Secondary | ICD-10-CM | POA: Diagnosis not present

## 2019-09-20 MED ORDER — FLUOXETINE HCL 10 MG PO CAPS: ORAL_CAPSULE | ORAL | 2 refills | Status: DC

## 2019-09-20 NOTE — Telephone Encounter (Signed)
Pt returned your call. Please call back [phone].

## 2019-09-20 NOTE — Progress Notes (Signed)
Tracy Cohen [id number] September 02, 1990 29 y.o.  Subjective:   Patient ID:  Tracy Cohen is a 29 y.o. (DOB 03-Oct-1990) female.  Chief Complaint: No chief complaint on file.   HPI Tracy Cohen presents to the office today for follow-up of MDD, GAD, insomina, panic attacks, and SAD.   Describes mood today as "not the best". Pleasant. Flat. Mood symptoms - reporting "some" depression. Feels a "little bit" irritable. Feels more anxious overall - "feels like something is going to happen". Feels like she is "anticipating" the "bad". Reports one panic attack in the past 72 hours - "related to having to go outside". Feels like medication has been helpful, but may need to go up on it "a little bit". Stating "the medication has helped with panic attacks". Best friends father passed away a week ago. Had a friend that passed away in a motorcycle accident 2 weeks ago. Grandmother still in hospital. Mother at home recovering. Feels like she needs time off of work to "get herself back together". Feels overwhelmed with all the losses. Stating "I need to at least be able to function, and I can't right now". Feels like medications are helpful, but may need to be increased a "little". Has been more worried and stressed out lately. Plans to see a therapist. Decreased interest and motivation - "I don't feel like doing anything to be honest". Taking medications as prescribed.  Energy levels "lower" - "feeling tired over the last week". Active, did not go to the gym this week - helping friend out. Has not returned to work since May 3rd, 2021.  Enjoys some usual interests and activities. Single. Lives alone. Family local. Spending time with family.Talking to friends.  Appetite decreased - "not eating as much as I usually do". Weight stable. Sleeping difficulties. Averages 4 to 5 hours. Focus and concentration difficulties "just a little bit". Completing tasks. Managing aspects of household.  Denies SI or HI. Denies AH or  VH. Denies impulsivity, substance use, racing thoughts, or excessive spending.  Previous medication trials: Xanax, Citalopram, Wellbutrin,    GAD-7     Office Visit from 07/07/2018 in [facility] Office Visit from 06/04/2018 in [facility]  Total GAD-7 Score  5  19    PHQ2-9     Office Visit from 06/04/2018 in [facility] Office Visit from 05/31/2017 in [facility] at [facility] Total Score  4  4  PHQ-9 Total Score  19  11       Review of Systems:  Review of Systems  Musculoskeletal: Negative for gait problem.  Neurological: Negative for tremors.  Psychiatric/Behavioral:       Please refer to HPI    Medications: I have reviewed the patient's current medications.  Current Outpatient Medications  Medication Sig Dispense Refill  . AIMOVIG 70 MG/ML SOAJ INJECT 70 MG INTO THE SKIN EVERY 30 (THIRTY) DAYS. 3 pen 3  . ALPRAZolam (XANAX) 0.25 MG tablet Take 1 tablet (0.25 mg total) by mouth 2 (two) times daily as needed for anxiety. 60 tablet 2  . cetirizine (ZYRTEC) 5 MG tablet Take 5 mg by mouth daily.    [name] FLUoxetine (PROZAC) 10 MG capsule Take three capsules daily. 90 capsule 2  . omeprazole (PRILOSEC) 20 MG capsule Take 1 capsule (20 mg total) by mouth as directed. Take 2 capsule (40 mg total) 2 times daily for 4 weeks then decrease to 1 capsule (20 mg total)  2 times daily for 2 weeks 140 capsule 0  . Rimegepant Sulfate (NURTEC) 75 MG TBDP Take 1 tablet by mouth daily as needed (Maximum 1 tablet in 24 hours). 8 tablet 3  . tinidazole (TINDAMAX) 500 MG tablet Take 500 mg by mouth 2 (two) times daily.    Burr Medico 150-35 MCG/24HR transdermal patch 1 (ONE) PATCH WEEKLY FOR 3 WEEKS AS DIRECTED     No current facility-administered medications for this visit.    Medication Side Effects: None  Allergies: No Known Allergies  Past Medical History:  Diagnosis Date  . Allergy   . Anxiety   . Depression   . Migraine      Family History  Problem Relation Age of Onset  . High blood pressure Mother   . Mental illness Father        schizophrenia  . Esophageal cancer Father   . Mental illness Maternal Grandmother        bipolar  . Kidney cancer Maternal Grandmother   . Cancer Maternal Grandfather   . Mental illness Paternal Grandmother   . Cancer Paternal Grandmother   . Mental illness Paternal Grandfather   . Colon cancer Neg Hx     Social History   Socioeconomic History  . Marital status: Single    Spouse name: Not on file  . Number of children: 0  . Years of education: 37  . Highest education level: Associate degree: occupational, Scientist, product/process development, or vocational program  Occupational History    Employer: [employer]  Tobacco Use  . Smoking status: Smoker, Current Status Unknown  . Smokeless tobacco: Never Used  Substance and Sexual Activity  . Alcohol use: Yes    Alcohol/week: 0.0 standard drinks    Comment: 2-3 drinks/wk  . Drug use: Not Currently  . Sexual activity: Yes    Birth control/protection: Condom, Patch    Comment: last intercourse July 28 2015( condom broke)  Other Topics Concern  . Not on file  Social History Narrative   Patient lives at home alone.    Patient works for [employer]   Patient has some college education.   Right handed.      Patient now works for [employer] and has received an associated an degree.      Social Determinants of Health   Financial Resource Strain:   . Difficulty of Paying Living Expenses:   Food Insecurity:   . Worried About Programme researcher, broadcasting/film/video in the Last Year:   . Barista in the Last Year:   Transportation Needs:   . Freight forwarder (Medical):   [name] Lack of Transportation (Non-Medical):   Physical Activity:   . Days of Exercise per Week:   . Minutes of Exercise per Session:   Stress:   . Feeling of Stress :   Social Connections:   . Frequency of Communication with Friends and Family:   . Frequency of Social  Gatherings with Friends and Family:   . Attends Religious Services:   . Active Member of Clubs or Organizations:   . Attends Banker Meetings:   [name] Marital Status:   Intimate Partner Violence:   . Fear of Current or Ex-Partner:   . Emotionally Abused:   [name] Physically Abused:   . Sexually Abused:     Past Medical History, Surgical history, Social history, and Family history were reviewed and updated as appropriate.   Please see review of systems for further details on the patient's  review from today.   Objective:   Physical Exam:  There were no vitals taken for this visit.  Physical Exam Constitutional:      General: She is not in acute distress. Musculoskeletal:        General: No deformity.  Neurological:     Mental Status: She is alert and oriented to person, place, and time.     Coordination: Coordination normal.  Psychiatric:        Attention and Perception: Attention and perception normal. She does not perceive auditory or visual hallucinations.        Mood and Affect: Mood is anxious and depressed. Affect is not labile, blunt, angry or inappropriate.        Speech: Speech normal.        Behavior: Behavior normal.        Thought Content: Thought content normal. Thought content is not paranoid or delusional. Thought content does not include homicidal or suicidal ideation. Thought content does not include homicidal or suicidal plan.        Cognition and Memory: Cognition and memory normal.        Judgment: Judgment normal.     Comments: Insight intact     Lab Review:     Component Value Date/Time   NA 143 07/15/2018 1259   NA 141 06/04/2018 1441   K 3.9 07/15/2018 1259   CL 108 07/15/2018 1259   CO2 25 07/15/2018 1259   GLUCOSE 96 07/15/2018 1259   BUN 11 07/15/2018 1259   BUN 8 06/04/2018 1441   CREATININE 0.69 07/15/2018 1259   CALCIUM 9.3 07/15/2018 1259   PROT 8.0 07/15/2018 1259   PROT 7.2 06/04/2018 1441   ALBUMIN 4.4 07/15/2018 1259    ALBUMIN 4.3 06/04/2018 1441   AST 24 07/15/2018 1259   ALT 18 07/15/2018 1259   ALKPHOS 58 07/15/2018 1259   BILITOT 0.4 07/15/2018 1259   BILITOT <0.2 06/04/2018 1441   GFRNONAA >60 07/15/2018 1259   GFRAA >60 07/15/2018 1259       Component Value Date/Time   WBC 11.6 (H) 07/15/2018 1259   RBC 4.69 07/15/2018 1259   HGB 12.8 07/15/2018 1259   HGB 12.5 06/04/2018 1441   HCT 40.9 07/15/2018 1259   HCT 39.1 06/04/2018 1441   PLT 317 07/15/2018 1259   PLT 340 06/04/2018 1441   MCV 87.2 07/15/2018 1259   MCV 82 06/04/2018 1441   MCH 27.3 07/15/2018 1259   MCHC 31.3 07/15/2018 1259   RDW 14.6 07/15/2018 1259   RDW 12.5 06/04/2018 1441   LYMPHSABS 1.3 04/08/2013 1358   MONOABS 0.5 04/08/2013 1358   EOSABS 0.0 04/08/2013 1358   BASOSABS 0.0 04/08/2013 1358    No results found for: POCLITH, LITHIUM   No results found for: PHENYTOIN, PHENOBARB, VALPROATE, CBMZ   .res Assessment: Plan:    Plan:  PDMP reviewed  1. Xanax 0.25mg  BID for panic/anxiety/sleep 2. Increase Prozac 20mg  to 30mg  daily 3. Set up with therapist   Out of work 09/06/2019 - 10/04/2019. Will see back on 10/04/2019 and will discuss a return to work date.   RTC 2 weeks  Patient advised to contact office with any questions, adverse effects, or acute worsening in signs and symptoms.  Discussed potential benefits, risk, and side effects of benzodiazepines to include potential risk of tolerance and dependence, as well as possible drowsiness.  Advised patient not to drive if experiencing drowsiness and to take lowest possible effective dose to minimize risk  of dependence and tolerance.    Diagnoses and all orders for this visit:  Panic attacks  Generalized anxiety disorder -     FLUoxetine (PROZAC) 10 MG capsule; Take three capsules daily.  Major depressive disorder, recurrent episode, moderate (HCC) -     FLUoxetine (PROZAC) 10 MG capsule; Take three capsules daily.  Insomnia, unspecified type      Please see After Visit Summary for patient specific instructions.  Future Appointments  Date Time Provider Department Center  10/05/2019  8:00 AM Mozingo, Thereasa Solo, NP [facility] None  10/21/2019 10:00 AM Mathis Fare, LCSW [facility] None  02/14/2020 10:10 AM Drema Dallas, DO [facility] None    No orders of the defined types were placed in this encounter.   -------------------------------

## 2019-09-20 NOTE — Telephone Encounter (Signed)
I had her out initially - first appointment. Never received any paperwork. She returned to work. Last visit 09/08/2019 - was back at work. Did not take her out at that time.

## 2019-09-20 NOTE — Telephone Encounter (Signed)
Noted  

## 2019-09-20 NOTE — Telephone Encounter (Signed)
Left patient voicemail that we received her paperwork from [place] for [employer], just need to confirm her dates for disability and anything else that needs to be added. Tracy Cohen's last note on 09/07/2019 indicate patient has already went back to work.

## 2019-09-21 ENCOUNTER — Ambulatory Visit: Payer: 59 | Admitting: Adult Health

## 2019-09-21 DIAGNOSIS — Z0289 Encounter for other administrative examinations: Secondary | ICD-10-CM

## 2019-09-21 NOTE — Telephone Encounter (Signed)
Signed.

## 2019-09-21 NOTE — Telephone Encounter (Signed)
Health Care Provider Statement has been faxed with office notes

## 2019-10-05 ENCOUNTER — Ambulatory Visit: Payer: 59 | Admitting: Adult Health

## 2019-10-06 ENCOUNTER — Encounter: Payer: Self-pay | Admitting: Adult Health

## 2019-10-06 ENCOUNTER — Ambulatory Visit (INDEPENDENT_AMBULATORY_CARE_PROVIDER_SITE_OTHER): Payer: 59 | Admitting: Adult Health

## 2019-10-06 ENCOUNTER — Other Ambulatory Visit: Payer: Self-pay

## 2019-10-06 DIAGNOSIS — F410 Panic disorder [episodic paroxysmal anxiety] without agoraphobia: Secondary | ICD-10-CM

## 2019-10-06 DIAGNOSIS — F411 Generalized anxiety disorder: Secondary | ICD-10-CM | POA: Diagnosis not present

## 2019-10-06 DIAGNOSIS — G4700 Insomnia, unspecified: Secondary | ICD-10-CM | POA: Diagnosis not present

## 2019-10-06 DIAGNOSIS — F331 Major depressive disorder, recurrent, moderate: Secondary | ICD-10-CM | POA: Diagnosis not present

## 2019-10-06 DIAGNOSIS — F4010 Social phobia, unspecified: Secondary | ICD-10-CM

## 2019-10-06 MED ORDER — ALPRAZOLAM 0.25 MG PO TABS: 0.2500 mg | ORAL_TABLET | Freq: Three times a day (TID) | ORAL | 2 refills | Status: DC | PRN

## 2019-10-06 NOTE — Progress Notes (Signed)
Tracy Cohen [id number] [birth year] 29 y.o.  Subjective:   Patient ID:  Tracy Cohen is a 29 y.o. (DOB 01-Jul-1990) female.  Chief Complaint: No chief complaint on file.   HPI Tracy Cohen presents to the office today for follow-up of MDD, GAD, insomina, panic attacks, and SAD.   Describes mood today as "not goodt". Flat. Tearful - crying. Mood symptoms - reports depression, anxiety, and irritability. Reports a panic attack yesterday - "severe". Was feeling "a little better" with the medication changes until Grandmother passed away over the weekend - services this coming weekend. Has been through several losses recently. Has an appointment with a therapist next week. Continues to feel overwhelmed" with the losses she's had. Does not feel like she is capable of returning to work at this time. Feels like she was making progress - "something always setting me back". Decreased interest and motivation. Taking medications as prescribed.  Energy levels "low". Stating "I just feel like crap". Active, has not exercised in 2 weeks.  Enjoys some usual interests and activities. Single. Lives alone. Family local. Spending time with family. Talking to friends.  Appetite decreased - "hasn't had an appetite in past few days". Weight stable. Sleeping difficulties - staying up all night the past few nights. Sleeping some during the day.  Focus and concentration difficulties "not so good". Completing tasks. Managing aspects of household - "not doing anything around the house".  Denies SI or HI. Denies AH or VH. Denies impulsivity, substance use, racing thoughts, or excessive spending.  Previous medication trials: Xanax, Citalopram, Wellbutrin,    GAD-7     Office Visit from 07/07/2018 in [place] Office Visit from 06/04/2018 in [place]  Total GAD-7 Score  5  19    PHQ2-9     Office Visit from 06/04/2018 in [place] Office Visit from  05/31/2017 in Primary Care at [facility] Total Score  4  4  PHQ-9 Total Score  19  11       Review of Systems:  Review of Systems  Musculoskeletal: Negative for gait problem.  Neurological: Negative for tremors.  Psychiatric/Behavioral:       Please refer to HPI    Medications: I have reviewed the patient's current medications.  Current Outpatient Medications  Medication Sig Dispense Refill  . AIMOVIG 70 MG/ML SOAJ INJECT 70 MG INTO THE SKIN EVERY 30 (THIRTY) DAYS. 3 pen 3  . ALPRAZolam (XANAX) 0.25 MG tablet Take 1 tablet (0.25 mg total) by mouth 3 (three) times daily as needed for anxiety. 90 tablet 2  . cetirizine (ZYRTEC) 5 MG tablet Take 5 mg by mouth daily.    [name] FLUoxetine (PROZAC) 10 MG capsule Take three capsules daily. 90 capsule 2  . omeprazole (PRILOSEC) 20 MG capsule Take 1 capsule (20 mg total) by mouth as directed. Take 2 capsule (40 mg total) 2 times daily for 4 weeks then decrease to 1 capsule (20 mg total) 2 times daily for 2 weeks 140 capsule 0  . Rimegepant Sulfate (NURTEC) 75 MG TBDP Take 1 tablet by mouth daily as needed (Maximum 1 tablet in 24 hours). 8 tablet 3  . tinidazole (TINDAMAX) 500 MG tablet Take 500 mg by mouth 2 (two) times daily.    Marilu Favre 150-35 MCG/24HR transdermal patch 1 (ONE) PATCH WEEKLY FOR 3 WEEKS AS DIRECTED     No current facility-administered medications for this visit.    Medication  Side Effects: None  Allergies: No Known Allergies  Past Medical History:  Diagnosis Date  . Allergy   . Anxiety   . Depression   . Migraine     Family History  Problem Relation Age of Onset  . High blood pressure Mother   . Mental illness Father        schizophrenia  . Esophageal cancer Father   . Mental illness Maternal Grandmother        bipolar  . Kidney cancer Maternal Grandmother   . Cancer Maternal Grandfather   . Mental illness Paternal Grandmother   . Cancer Paternal Grandmother   . Mental illness Paternal Grandfather   .  Colon cancer Neg Hx     Social History   Socioeconomic History  . Marital status: Single    Spouse name: Not on file  . Number of children: 0  . Years of education: 43  . Highest education level: Associate degree: occupational, Scientist, product/process development, or vocational program  Occupational History    Employer: [employer]  Tobacco Use  . Smoking status: Smoker, Current Status Unknown  . Smokeless tobacco: Never Used  Substance and Sexual Activity  . Alcohol use: Yes    Alcohol/week: 0.0 standard drinks    Comment: 2-3 drinks/wk  . Drug use: Not Currently  . Sexual activity: Yes    Birth control/protection: Condom, Patch    Comment: last intercourse July 28 2015( condom broke)  Other Topics Concern  . Not on file  Social History Narrative   Patient lives at home alone.    Patient works for [employer]   Patient has some college education.   Right handed.      Patient now works for [employer] and has received an associated an degree.      Social Determinants of Health   Financial Resource Strain:   . Difficulty of Paying Living Expenses:   Food Insecurity:   . Worried About Programme researcher, broadcasting/film/video in the Last Year:   . Barista in the Last Year:   Transportation Needs:   . Freight forwarder (Medical):   [name] Lack of Transportation (Non-Medical):   Physical Activity:   . Days of Exercise per Week:   . Minutes of Exercise per Session:   Stress:   . Feeling of Stress :   Social Connections:   . Frequency of Communication with Friends and Family:   . Frequency of Social Gatherings with Friends and Family:   . Attends Religious Services:   . Active Member of Clubs or Organizations:   . Attends Banker Meetings:   [name] Marital Status:   Intimate Partner Violence:   . Fear of Current or Ex-Partner:   . Emotionally Abused:   [name] Physically Abused:   . Sexually Abused:     Past Medical History, Surgical history, Social history, and Family history were reviewed and  updated as appropriate.   Please see review of systems for further details on the patient's review from today.   Objective:   Physical Exam:  There were no vitals taken for this visit.  Physical Exam Constitutional:      General: She is not in acute distress. Musculoskeletal:        General: No deformity.  Neurological:     Mental Status: She is alert and oriented to person, place, and time.     Coordination: Coordination normal.  Psychiatric:        Attention and Perception:  Attention and perception normal. She does not perceive auditory or visual hallucinations.        Mood and Affect: Mood is anxious and depressed. Affect is flat and tearful. Affect is not labile, blunt, angry or inappropriate.        Speech: Speech normal.        Behavior: Behavior normal.        Thought Content: Thought content normal. Thought content is not paranoid or delusional. Thought content does not include homicidal or suicidal ideation. Thought content does not include homicidal or suicidal plan.        Cognition and Memory: Cognition and memory normal.        Judgment: Judgment normal.     Comments: Insight intact     Lab Review:     Component Value Date/Time   NA 143 07/15/2018 1259   NA 141 06/04/2018 1441   K 3.9 07/15/2018 1259   CL 108 07/15/2018 1259   CO2 25 07/15/2018 1259   GLUCOSE 96 07/15/2018 1259   BUN 11 07/15/2018 1259   BUN 8 06/04/2018 1441   CREATININE 0.69 07/15/2018 1259   CALCIUM 9.3 07/15/2018 1259   PROT 8.0 07/15/2018 1259   PROT 7.2 06/04/2018 1441   ALBUMIN 4.4 07/15/2018 1259   ALBUMIN 4.3 06/04/2018 1441   AST 24 07/15/2018 1259   ALT 18 07/15/2018 1259   ALKPHOS 58 07/15/2018 1259   BILITOT 0.4 07/15/2018 1259   BILITOT <0.2 06/04/2018 1441   GFRNONAA >60 07/15/2018 1259   GFRAA >60 07/15/2018 1259       Component Value Date/Time   WBC 11.6 (H) 07/15/2018 1259   RBC 4.69 07/15/2018 1259   HGB 12.8 07/15/2018 1259   HGB 12.5 06/04/2018 1441   HCT  40.9 07/15/2018 1259   HCT 39.1 06/04/2018 1441   PLT 317 07/15/2018 1259   PLT 340 06/04/2018 1441   MCV 87.2 07/15/2018 1259   MCV 82 06/04/2018 1441   MCH 27.3 07/15/2018 1259   MCHC 31.3 07/15/2018 1259   RDW 14.6 07/15/2018 1259   RDW 12.5 06/04/2018 1441   LYMPHSABS 1.3 04/08/2013 1358   MONOABS 0.5 04/08/2013 1358   EOSABS 0.0 04/08/2013 1358   BASOSABS 0.0 04/08/2013 1358    No results found for: POCLITH, LITHIUM   No results found for: PHENYTOIN, PHENOBARB, VALPROATE, CBMZ   .res Assessment: Plan:    Plan:  PDMP reviewed  1. Increase Xanax 0.25mg  BID to TID for panic/anxiety/sleep - temporary increase.  2. Continue Prozac 30mg  daily 3. Schedeuled with therapist   Out of work 10/04/2019 - 11/01/2019. Will see back on 11/01/2019 and will discuss a return to work date.   RTC 4 weeks  Patient advised to contact office with any questions, adverse effects, or acute worsening in signs and symptoms.  Discussed potential benefits, risk, and side effects of benzodiazepines to include potential risk of tolerance and dependence, as well as possible drowsiness.  Advised patient not to drive if experiencing drowsiness and to take lowest possible effective dose to minimize risk of dependence and tolerance.   Diagnoses and all orders for this visit:  Generalized anxiety disorder -     ALPRAZolam (XANAX) 0.25 MG tablet; Take 1 tablet (0.25 mg total) by mouth 3 (three) times daily as needed for anxiety.  Major depressive disorder, recurrent episode, moderate (HCC)  Panic attacks  Insomnia, unspecified type -     ALPRAZolam (XANAX) 0.25 MG tablet; Take 1 tablet (0.25 mg total)  by mouth 3 (three) times daily as needed for anxiety.  Social anxiety disorder -     ALPRAZolam (XANAX) 0.25 MG tablet; Take 1 tablet (0.25 mg total) by mouth 3 (three) times daily as needed for anxiety.     Please see After Visit Summary for patient specific instructions.  Future Appointments   Date Time Provider Department Center  10/21/2019 10:00 AM Mathis Fare, LCSW [facility] None  02/14/2020 10:10 AM Drema Dallas, DO [facility] None    No orders of the defined types were placed in this encounter.   -------------------------------

## 2019-10-07 ENCOUNTER — Telehealth: Payer: Self-pay | Admitting: Adult Health

## 2019-10-07 NOTE — Telephone Encounter (Signed)
Received 2 [employer] forms. Placed on Nurse desk. 10/07/2019

## 2019-10-08 ENCOUNTER — Telehealth: Payer: Self-pay | Admitting: Adult Health

## 2019-10-08 NOTE — Telephone Encounter (Signed)
Pt is wanting to increase her ALPRAZOLAM. Requesting call back on # [phone].

## 2019-10-08 NOTE — Telephone Encounter (Signed)
Noted  

## 2019-10-08 NOTE — Telephone Encounter (Signed)
Tracy Cohen is out of office today, this would have to be addressed with her on Monday to review.

## 2019-10-11 DIAGNOSIS — Z0289 Encounter for other administrative examinations: Secondary | ICD-10-CM

## 2019-10-11 NOTE — Telephone Encounter (Signed)
Noted  

## 2019-10-12 NOTE — Telephone Encounter (Signed)
Paperwork signed and faxed to [employer] with OV notes

## 2019-10-18 LAB — AMB REFERRAL TO OB-GYN: Pap: NEGATIVE

## 2019-10-21 ENCOUNTER — Ambulatory Visit (INDEPENDENT_AMBULATORY_CARE_PROVIDER_SITE_OTHER): Payer: 59 | Admitting: Psychiatry

## 2019-10-21 ENCOUNTER — Other Ambulatory Visit: Payer: Self-pay

## 2019-10-21 DIAGNOSIS — F411 Generalized anxiety disorder: Secondary | ICD-10-CM | POA: Diagnosis not present

## 2019-10-21 NOTE — Progress Notes (Signed)
[facility] Counselor Initial Adult Exam  Name: Tracy Cohen Date: 10/21/2019 MRN: [id number] DOB: 1991/04/12 PCP: Unknown Jim, DO  Time spent:  60 minute 9:58am to 10:58am  Guardian/Payee:  patient  Paperwork requested:  No   Reason for Visit /Presenting Problem / Symptoms: anxiety, frustration, some depression, sometimes have mood swings "but not drastic"  Mental Status Exam:   Appearance:   Casual     Behavior:  Appropriate, Sharing and Motivated (motivation a bit better)  Motor:  Normal  Speech/Language:   Clear and Coherent  Affect:  anxiety, some depression  Mood:  anxious, depressed and some tearfulness  Thought process:  goal directed  Thought content:    WNL  Sensory/Perceptual disturbances:    WNL  Orientation:  oriented to person, place, time/date, situation, day of week, month of year and year  Attention:  Fair  Concentration:  Fair  Memory:  WNL  Fund of knowledge:   Good  Insight:    Good  Judgment:   Good  Impulse Control:  Good   Reported Symptoms:  See symptoms above  Risk Assessment: Danger to Self:  No Self-injurious Behavior: No Danger to Others: No Duty to Warn:no Physical Aggression / Violence:No  Access to Firearms a concern: No  Gang Involvement:No  Patient / guardian was educated about steps to take if suicide or homicide risk level increases between visits: no; Patient denies any SI. While future psychiatric events cannot be accurately predicted, the patient does not currently require acute inpatient psychiatric care and does not currently meet [facility] involuntary commitment criteria.  Substance Abuse History: Current substance abuse: No     Past Psychiatric History:   Previous psychological history is significant for anxiety and learning disability Outpatient Providers: at [facility] counseling practice History of Psych Hospitalization: n/a Psychological Testing: n/a   Abuse History: Victim of No., n/a   Report  needed: No. Victim of Neglect:No. Perpetrator of n/a  Witness / Exposure to Domestic Violence: No   Protective Services Involvement: No  Witness to [employer] Violence:  No   Family History:  Reviewed with Patient and she confirms info is correct. Family History  Problem Relation Age of Onset  . High blood pressure Mother   . Mental illness Father        schizophrenia  . Esophageal cancer Father   . Mental illness Maternal Grandmother        bipolar  . Kidney cancer Maternal Grandmother   . Cancer Maternal Grandfather   . Mental illness Paternal Grandmother   . Cancer Paternal Grandmother   . Mental illness Paternal Grandfather   . Colon cancer Neg Hx     Living situation: the patient lives alone  Sexual Orientation:  Straight  Relationship Status: single  Name of spouse / other: n/a             If a parent, number of children / ages: no children  Support Systems; friends parents  Surveyor, quantity Stress:  some  Income/Employment/Disability: Employment  Financial planner: No   Educational History: Education: some college  Religion/Sprituality/World View:   Protestant  Any cultural differences that may affect / interfere with treatment:  not applicable   Recreation/Hobbies: working out, shopping  Stressors:Financial difficulties Loss of maternal grandmother  Strengths:  Supportive Relationships, Family, Spirituality, Hopefulness and Able to Communicate Effectively  Barriers:  myself and self-doubt  Legal History: Pending legal issue / charges: The patient has no significant history of legal issues. History of  legal issue / charges: n/a  Medical History/Surgical History:Reviewed with Patient and she confirms info below. Past Medical History:  Diagnosis Date  . Allergy   . Anxiety   . Depression   . Migraine     Past Surgical History:  Procedure Laterality Date  . ADENOIDECTOMY    . TONSILLECTOMY      Medications: Current Outpatient Medications   Medication Sig Dispense Refill  . AIMOVIG 70 MG/ML SOAJ INJECT 70 MG INTO THE SKIN EVERY 30 (THIRTY) DAYS. 3 pen 3  . ALPRAZolam (XANAX) 0.25 MG tablet Take 1 tablet (0.25 mg total) by mouth 3 (three) times daily as needed for anxiety. 90 tablet 2  . cetirizine (ZYRTEC) 5 MG tablet Take 5 mg by mouth daily.    [name] FLUoxetine (PROZAC) 10 MG capsule Take three capsules daily. 90 capsule 2  . omeprazole (PRILOSEC) 20 MG capsule Take 1 capsule (20 mg total) by mouth as directed. Take 2 capsule (40 mg total) 2 times daily for 4 weeks then decrease to 1 capsule (20 mg total) 2 times daily for 2 weeks 140 capsule 0  . Rimegepant Sulfate (NURTEC) 75 MG TBDP Take 1 tablet by mouth daily as needed (Maximum 1 tablet in 24 hours). 8 tablet 3  . tinidazole (TINDAMAX) 500 MG tablet Take 500 mg by mouth 2 (two) times daily.    Marilu Favre 150-35 MCG/24HR transdermal patch 1 (ONE) PATCH WEEKLY FOR 3 WEEKS AS DIRECTED     No current facility-administered medications for this visit.    No Known Allergies  Today patient reports having "just seasonal allergies like pollen."  Diagnoses:    ICD-10-CM   1. Generalized anxiety disorder  F41.1     Plan of Care:  Patient not signing Treatment Plan on computer screen due to Covid.  Treatment Goals: Treatment goals remain on treatment plan as patient works with strategies to achieve her goals.  Progress will be documented each session and noted in "Progress" section of Treatment Plan.  Long term goal: Reduce overall level, frequency, and intensity of the anxiety/depressive thoughts/fearful thinking so that daily functioning is not impaired.  Short term goal: Verbalize an understanding of the role that anxious/depressive/fearful thinking plays in creating fears, excessive worrying, depression, and persistent anxiety symptoms.   Strategy: Identify, challenge, and replace anxious/fearful/depressive thoughts and self-talk with positive, realistic, and empowering  thoughts and self-talk that do not support anxiety/depression/fearfulness.   Progress: Today is first therapy appt for patient.  We completed her initial evaluation and then worked together on her treatment goals. Patient is a 29 yr old, single female, with no children. 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 Not currently dating and has had some negative experiences in past with dating. Doesn't know when she is "at her best and feeling happy" and added "not happy very often." and tearful as she speaks more about this. I'm an emotional person and that bothers me. Overthinking. I want to lead a happy safe life. I want my stressors and anxiety not to affect me so much mentally and physically. Need to accept what I can and can't change. Strong hx of behavioral health issues with father and both sets of grandparents.Two weeks ago my grandmother in [place] died and she was my closest person." (We discussed some now and to tollow up on this more next visit).    Patient to return in 2 weeks.   Shanon Ace, LCSW

## 2019-11-03 ENCOUNTER — Telehealth: Payer: 59 | Admitting: Adult Health

## 2019-11-05 ENCOUNTER — Ambulatory Visit (INDEPENDENT_AMBULATORY_CARE_PROVIDER_SITE_OTHER): Payer: 59 | Admitting: Psychiatry

## 2019-11-05 ENCOUNTER — Other Ambulatory Visit: Payer: Self-pay

## 2019-11-05 DIAGNOSIS — F411 Generalized anxiety disorder: Secondary | ICD-10-CM

## 2019-11-05 NOTE — Progress Notes (Signed)
[facility] Counselor/Therapist Progress Note  Patient ID: Tracy Cohen, MRN: [id number],    Date: 11/05/2019  Time Spent: 60 minutes   10:00am to 11:00am   Treatment Type: Individual Therapy  Reported Symptoms: anxiety, depression, angry "at life" and it being so difficult, negative self-talk and negative view of self,  denies any SI, wants to be more motivated and determined to make changes that will help her.  Mental Status Exam:  Appearance:   Well Groomed     Behavior:  Appropriate and Sharing  Motor:  Normal  Speech/Language:   Clear and Coherent  Affect:  anxious, sad, depressed  Mood:  anxious, depressed and sad  Thought process:  normal  Thought content:    WNL  Sensory/Perceptual disturbances:    WNL  Orientation:  oriented to person, place, time/date, situation, day of week, month of year and year  Attention:  Fair  Concentration:  Fair  Memory:  forgetful sometimes and worse under stress  Fund of knowledge:   Good  Insight:    Good and Fair  Judgment:   Good and Fair  Impulse Control:  Good   Risk Assessment: Danger to Self:  No Self-injurious Behavior: No Danger to Others: No Duty to Warn:no Physical Aggression / Violence:No  Access to Firearms a concern: No  Gang Involvement:No   Subjective: Patient today   Interventions: Cognitive Behavioral Therapy and Solution-Oriented/Positive Psychology  Diagnosis:   ICD-10-CM   1. Generalized anxiety disorder  F41.1      Plan of Care:  Patient not signing Treatment Plan on computer screen due to Covid.  Treatment Goals: Treatment goals remain on treatment plan as patient works with strategies to achieve her goals.  Progress will be documented each session and noted in "Progress" section of Treatment Plan.  Long term goal: Reduce overall level, frequency, and intensity of the anxiety/depressive thoughts/fearful thinking so that daily functioning is not impaired.  Short term goal: Verbalize an  understanding of the role that anxious/depressive/fearful thinking plays in creating fears, excessive worrying, depression, and persistent anxiety symptoms.   Strategy: Identify, challenge, and replace anxious/fearful/depressive thoughts and self-talk with positive, realistic, and empowering thoughts and self-talk that do not support anxiety/depression/fearfulness.   Progress: Patient in today reporting anxiety, difficulty controlling my emotions, depression mostly "everything in my life, the only thing positive is me being alive." Lots of issues going on but she shares that the main distress right now is the recent unexpected death of her grandmother who "was her most important person and influence in her life."  Very tearful and processing lots of feelings she had for her grandmother and worked on strategies and self-care that can help her right now and into the future.  Very emotional. Has tried to keep her "difficulties and emotions private" and agrees today to share some of her struggles with at least a family member or friend. Sharing a lot of her feelings and emotions today did seem to help and she was calmer and more grounded by session end. Denies any SI.  Encouraged her to follow through on some "homework" and good self-care including some written homework re: stressors and also "positives" she notices each day, getting outside each day, physical exercise or walking, healthy nutrition, contact with some family and friends with which she feels comfortable, using her faith as a support, and practice more positive self-talk.    Goal review and progress/challenges/efforts noted with patient.  Next appt within 1-2 weeks.  Shanon Ace, LCSW

## 2019-11-09 ENCOUNTER — Ambulatory Visit (INDEPENDENT_AMBULATORY_CARE_PROVIDER_SITE_OTHER): Payer: 59 | Admitting: Psychiatry

## 2019-11-09 ENCOUNTER — Encounter: Payer: Self-pay | Admitting: Adult Health

## 2019-11-09 ENCOUNTER — Telehealth (INDEPENDENT_AMBULATORY_CARE_PROVIDER_SITE_OTHER): Payer: 59 | Admitting: Adult Health

## 2019-11-09 ENCOUNTER — Telehealth: Payer: Self-pay | Admitting: Adult Health

## 2019-11-09 DIAGNOSIS — F411 Generalized anxiety disorder: Secondary | ICD-10-CM

## 2019-11-09 DIAGNOSIS — F4010 Social phobia, unspecified: Secondary | ICD-10-CM

## 2019-11-09 DIAGNOSIS — F331 Major depressive disorder, recurrent, moderate: Secondary | ICD-10-CM | POA: Diagnosis not present

## 2019-11-09 DIAGNOSIS — G4700 Insomnia, unspecified: Secondary | ICD-10-CM

## 2019-11-09 DIAGNOSIS — F410 Panic disorder [episodic paroxysmal anxiety] without agoraphobia: Secondary | ICD-10-CM | POA: Diagnosis not present

## 2019-11-09 MED ORDER — ALPRAZOLAM 0.5 MG PO TABS: 0.5000 mg | ORAL_TABLET | Freq: Three times a day (TID) | ORAL | 2 refills | Status: DC | PRN

## 2019-11-09 MED ORDER — FLUOXETINE HCL 10 MG PO CAPS: ORAL_CAPSULE | ORAL | 2 refills | Status: DC

## 2019-11-09 NOTE — Progress Notes (Signed)
Tracy Cohen [id number] [birth year] 29 y.o.  Virtual Visit via Video Note  I connected with pt @ on 11/09/19 at  8:40 AM EDT by a video enabled telemedicine application and verified that I am speaking with the correct person using two identifiers.   I discussed the limitations of evaluation and management by telemedicine and the availability of in person appointments. The patient expressed understanding and agreed to proceed.  I discussed the assessment and treatment plan with the patient. The patient was provided an opportunity to ask questions and all were answered. The patient agreed with the plan and demonstrated an understanding of the instructions.   The patient was advised to call back or seek an in-person evaluation if the symptoms worsen or if the condition fails to improve as anticipated.  I provided 30 minutes of non-face-to-face time during this encounter.  The patient was located at home.  The provider was located at [facility].   Dorothyann Gibbs, NP   Subjective:   Patient ID:  Tracy Cohen is a 29 y.o. (DOB 1990-12-01) female.  Chief Complaint: No chief complaint on file.   HPI BEE MARCHIANO presents for follow-up of MDD, GAD, insomina, panic attacks, and SAD.    Describes mood today as "not good". Flat. Tearful - "I cry everyday". Mood symptoms - reports depression, anxiety, and irritability. Stating "It's going, I'm here". Also stating "I would like for things to be going better, but that's life I guess". Reports 2 to 3 panic attacks a day. Stating "I don't feel normal and can't function normally". Has returned to work, but is having increased anxiety with panic.Has started seeing a therapist. Feels like Prozac has been helpful.  Decreased interest and motivation. Taking medications as prescribed.  Energy levels "low". Feels very tired for the last few days. Active, has not exercised recently.  Enjoys some usual interests and activities. Single. Lives  alone. Family local. Spending time with family. Talking to friends.  Appetite decreased - "not eating that much". Weight stable. Sleeping better some nights than others. Waking up 2 to 3 times a night. Averages 4 to 5 hours a night. Denies daytime napping.  Focus and concentration difficulties "forgetting what I'm doing at times". Completing tasks - trying to organize her room. Managing aspects of household. Denies SI or HI. Denies AH or VH. Denies impulsivity, substance use, racing thoughts, or excessive spending.  Previous medication trials: Xanax, Citalopram, Wellbutrin,    Review of Systems:  Review of Systems  Musculoskeletal: Negative for gait problem.  Neurological: Negative for tremors.  Psychiatric/Behavioral:       Please refer to HPI    Medications: I have reviewed the patient's current medications.  Current Outpatient Medications  Medication Sig Dispense Refill  . AIMOVIG 70 MG/ML SOAJ INJECT 70 MG INTO THE SKIN EVERY 30 (THIRTY) DAYS. 3 pen 3  . ALPRAZolam (XANAX) 0.5 MG tablet Take 1 tablet (0.5 mg total) by mouth 3 (three) times daily as needed for anxiety. 90 tablet 2  . cetirizine (ZYRTEC) 5 MG tablet Take 5 mg by mouth daily.    [name] FLUoxetine (PROZAC) 10 MG capsule Take three capsules daily. 90 capsule 2  . omeprazole (PRILOSEC) 20 MG capsule Take 1 capsule (20 mg total) by mouth as directed. Take 2 capsule (40 mg total) 2 times daily for 4 weeks then decrease to 1 capsule (20 mg total) 2 times daily for 2 weeks 140 capsule 0  . Rimegepant Sulfate (NURTEC) 75 MG  TBDP Take 1 tablet by mouth daily as needed (Maximum 1 tablet in 24 hours). 8 tablet 3  . tinidazole (TINDAMAX) 500 MG tablet Take 500 mg by mouth 2 (two) times daily.    Burr Medico. XULANE 150-35 MCG/24HR transdermal patch 1 (ONE) PATCH WEEKLY FOR 3 WEEKS AS DIRECTED     No current facility-administered medications for this visit.    Medication Side Effects: None  Allergies: No Known Allergies  Past Medical  History:  Diagnosis Date  . Allergy   . Anxiety   . Depression   . Migraine     Family History  Problem Relation Age of Onset  . High blood pressure Mother   . Mental illness Father        schizophrenia  . Esophageal cancer Father   . Mental illness Maternal Grandmother        bipolar  . Kidney cancer Maternal Grandmother   . Cancer Maternal Grandfather   . Mental illness Paternal Grandmother   . Cancer Paternal Grandmother   . Mental illness Paternal Grandfather   . Colon cancer Neg Hx     Social History   Socioeconomic History  . Marital status: Single    Spouse name: Not on file  . Number of children: 0  . Years of education: 3213  . Highest education level: Associate degree: occupational, Scientist, product/process developmenttechnical, or vocational program  Occupational History    Employer: [employer]  Tobacco Use  . Smoking status: Smoker, Current Status Unknown  . Smokeless tobacco: Never Used  Vaping Use  . Vaping Use: Never used  Substance and Sexual Activity  . Alcohol use: Yes    Alcohol/week: 0.0 standard drinks    Comment: 2-3 drinks/wk  . Drug use: Not Currently  . Sexual activity: Yes    Birth control/protection: Condom, Patch    Comment: last intercourse July 28 2015( condom broke)  Other Topics Concern  . Not on file  Social History Narrative   Patient lives at home alone.    Patient works for [employer].   Patient has some college education.   Right handed.      Patient now works for [employer] and has received an associated an degree.      Social Determinants of Health   Financial Resource Strain:   . Difficulty of Paying Living Expenses:   Food Insecurity:   . Worried About Programme researcher, broadcasting/film/videounning Out of Food in the Last Year:   . Baristaan Out of Food in the Last Year:   Transportation Needs:   . Freight forwarderLack of Transportation (Medical):   [name]. Lack of Transportation (Non-Medical):   Physical Activity:   . Days of Exercise per Week:   . Minutes of Exercise per Session:   Stress:   . Feeling of  Stress :   Social Connections:   . Frequency of Communication with Friends and Family:   . Frequency of Social Gatherings with Friends and Family:   . Attends Religious Services:   . Active Member of Clubs or Organizations:   . Attends BankerClub or Organization Meetings:   [name]. Marital Status:   Intimate Partner Violence:   . Fear of Current or Ex-Partner:   . Emotionally Abused:   [name]. Physically Abused:   . Sexually Abused:     Past Medical History, Surgical history, Social history, and Family history were reviewed and updated as appropriate.   Please see review of systems for further details on the patient's review from today.   Objective:  Physical Exam:  There were no vitals taken for this visit.  Physical Exam Constitutional:      General: She is not in acute distress. Musculoskeletal:        General: No deformity.  Neurological:     Mental Status: She is alert and oriented to person, place, and time.     Coordination: Coordination normal.  Psychiatric:        Attention and Perception: Attention and perception normal. She does not perceive auditory or visual hallucinations.        Mood and Affect: Mood normal. Mood is not anxious or depressed. Affect is not labile, blunt, angry or inappropriate.        Speech: Speech normal.        Behavior: Behavior normal.        Thought Content: Thought content normal. Thought content is not paranoid or delusional. Thought content does not include homicidal or suicidal ideation. Thought content does not include homicidal or suicidal plan.        Cognition and Memory: Cognition and memory normal.        Judgment: Judgment normal.     Comments: Insight intact     Lab Review:     Component Value Date/Time   NA 143 07/15/2018 1259   NA 141 06/04/2018 1441   K 3.9 07/15/2018 1259   CL 108 07/15/2018 1259   CO2 25 07/15/2018 1259   GLUCOSE 96 07/15/2018 1259   BUN 11 07/15/2018 1259   BUN 8 06/04/2018 1441   CREATININE 0.69 07/15/2018  1259   CALCIUM 9.3 07/15/2018 1259   PROT 8.0 07/15/2018 1259   PROT 7.2 06/04/2018 1441   ALBUMIN 4.4 07/15/2018 1259   ALBUMIN 4.3 06/04/2018 1441   AST 24 07/15/2018 1259   ALT 18 07/15/2018 1259   ALKPHOS 58 07/15/2018 1259   BILITOT 0.4 07/15/2018 1259   BILITOT <0.2 06/04/2018 1441   GFRNONAA >60 07/15/2018 1259   GFRAA >60 07/15/2018 1259       Component Value Date/Time   WBC 11.6 (H) 07/15/2018 1259   RBC 4.69 07/15/2018 1259   HGB 12.8 07/15/2018 1259   HGB 12.5 06/04/2018 1441   HCT 40.9 07/15/2018 1259   HCT 39.1 06/04/2018 1441   PLT 317 07/15/2018 1259   PLT 340 06/04/2018 1441   MCV 87.2 07/15/2018 1259   MCV 82 06/04/2018 1441   MCH 27.3 07/15/2018 1259   MCHC 31.3 07/15/2018 1259   RDW 14.6 07/15/2018 1259   RDW 12.5 06/04/2018 1441   LYMPHSABS 1.3 04/08/2013 1358   MONOABS 0.5 04/08/2013 1358   EOSABS 0.0 04/08/2013 1358   BASOSABS 0.0 04/08/2013 1358    No results found for: POCLITH, LITHIUM   No results found for: PHENYTOIN, PHENOBARB, VALPROATE, CBMZ   .res Assessment: Plan:    Plan:  PDMP reviewed  1. Xanax 0.25mg  to 0.5mg  TID for panic/anxiety/sleep - temporary increase.  2. Prozac 30mg  daily 3. Schedeuled with therapist   Returned to work on November 05, 2019.   RTC 4 weeks  Continue therapy with November 07, 2019.   Patient advised to contact office with any questions, adverse effects, or acute worsening in signs and symptoms.  Discussed potential benefits, risk, and side effects of benzodiazepines to include potential risk of tolerance and dependence, as well as possible drowsiness.  Advised patient not to drive if experiencing drowsiness and to take lowest possible effective dose to minimize risk of dependence and tolerance.   Diagnoses and  all orders for this visit:  Panic attacks  Generalized anxiety disorder -     FLUoxetine (PROZAC) 10 MG capsule; Take three capsules daily. -     ALPRAZolam (XANAX) 0.5 MG tablet; Take 1 tablet  (0.5 mg total) by mouth 3 (three) times daily as needed for anxiety.  Major depressive disorder, recurrent episode, moderate (HCC) -     FLUoxetine (PROZAC) 10 MG capsule; Take three capsules daily.  Insomnia, unspecified type -     ALPRAZolam (XANAX) 0.5 MG tablet; Take 1 tablet (0.5 mg total) by mouth 3 (three) times daily as needed for anxiety.  Social anxiety disorder -     ALPRAZolam (XANAX) 0.5 MG tablet; Take 1 tablet (0.5 mg total) by mouth 3 (three) times daily as needed for anxiety.     Please see After Visit Summary for patient specific instructions.  Future Appointments  Date Time Provider Department Center  11/09/2019 10:00 AM Mathis Fare, LCSW [facility] None  11/23/2019  8:00 AM Mathis Fare, LCSW [facility] None  12/07/2019  9:00 AM Mathis Fare, LCSW [facility] None  12/21/2019  9:00 AM Mathis Fare, LCSW [facility] None  02/14/2020 10:10 AM Drema Dallas, DO [facility] None    No orders of the defined types were placed in this encounter.     -------------------------------

## 2019-11-09 NOTE — Telephone Encounter (Signed)
Tracy Cohen, Tracy Cohen are scheduled for a virtual visit with your provider today.    Just as we do with appointments in the office, we must obtain your consent to participate.  Your consent will be active for this visit and any virtual visit you may have with one of our providers in the next 365 days.    If you have a MyChart account, I can also send a copy of this consent to you electronically.  All virtual visits are billed to your insurance company just like a traditional visit in the office.  As this is a virtual visit, video technology does not allow for your provider to perform a traditional examination.  This may limit your provider's ability to fully assess your condition.  If your provider identifies any concerns that need to be evaluated in person or the need to arrange testing such as labs, EKG, etc, we will make arrangements to do so.    Although advances in technology are sophisticated, we cannot ensure that it will always work on either your end or our end.  If the connection with a video visit is poor, we may have to switch to a telephone visit.  With either a video or telephone visit, we are not always able to ensure that we have a secure connection.   I need to obtain your verbal consent now.   Are you willing to proceed with your visit today?   Tracy Cohen has provided verbal consent on 11/09/2019 for a virtual visit (video or telephone).   Dorothyann Gibbs, NP 11/09/2019  8:42 AM

## 2019-11-09 NOTE — Progress Notes (Signed)
[facility] Counselor/Therapist Progress Note  Patient ID: Tracy Cohen, MRN: [id number],    Date: 11/09/2019  Time Spent: 60 minutes   10:00am to 11:00am  Virtual Visit Note via Copywriter, advertising with patient by a video enabled telemedicine/telehealth application or telephone, with their informed consent, and verified patient privacy and that I am speaking with the correct person using two identifiers. I discussed the limitations, risks, security and privacy concerns of performing psychotherapy and management service by telephone and the availability of in person appointments. I also discussed with the patient that there may be a patient responsible charge related to this service. The patient expressed understanding and agreed to proceed. I discussed the treatment planning with the patient. The patient was provided an opportunity to ask questions and all were answered. The patient agreed with the plan and demonstrated an understanding of the instructions. The patient was advised to call  our office if  symptoms worsen or feel they are in a crisis state and need immediate contact.   Therapist Location: [facility] Patient Location: home   Treatment Type: Individual Therapy  Reported Symptoms: "my general anxiety" has been some worse, had several panic attacks "a few days past week", tearfuless, not sleeping well, denies any SI or HI.  Mental Status Exam:  Appearance:   Casual     Behavior:  Sharing  Motor:  Normal  Speech/Language:   Clear and Coherent  Affect:  anxious, some "little bit of depression"  Mood:  anxious and depressed  Thought process:  normal  Thought content:    WNL  Sensory/Perceptual disturbances:    WNL  Orientation:  oriented to person, place, time/date, situation, day of week, month of year and year  Attention:  Fair  Concentration:  Fair  Memory:  some forgetfulness and worse under stress  Fund of knowledge:   Good  Insight:    Fair   Judgment:   Fair  Impulse Control:  Good and Fair   Risk Assessment: Danger to Self:  No Self-injurious Behavior: No Danger to Others: No Duty to Warn:no Physical Aggression / Violence:No  Access to Firearms a concern: No  Gang Involvement:No   Subjective: Patient today reports anxieity, some mild depression, and trying to work harder on her goals and not "shut down".  "Trying to work harder and believe I can get better."   Interventions: Cognitive Behavioral Therapy and Solution-Oriented/Positive Psychology  Diagnosis:   ICD-10-CM   1. Generalized anxiety disorder  F41.1     Plan of Care: Patient not signing Treatment Plan on computer screen due to Covid.  Treatment Goals: Treatment goals remain on treatment plan as patient works with strategies to achieve her goals. Progress will be documented each session and noted in "Progress" section of Treatment Plan.  Long term goal: Reduce overall level, frequency, andintensity of the anxiety/depressive thoughts/fearful thinking so that daily functioning is not impaired.  Short term goal: Verbalize an understanding of the role that anxious/depressive/fearful thinking plays in creating fears, excessive worrying, depression, and persistent anxiety symptoms.  Strategy: Identify, challenge, and replace anxious/fearful/depressive thoughts and self-talk with positive, realistic, and empowering thoughts and self-talk that do not support anxiety/depression/fearfulness.  Progress: Patient today not as tearful but still having significant anxiety and "little bits of depression."  Was not able to complete prior homework.  "When I think about my life I just get teary and anxious, I ask myself how much control I have in my life, I feel like  I don't have much control over what happens."  Have lots of feelings especially anxiety and depression which can be stronger that just bits of depression."  "I don't recall a time that I was very happy  in life."  But did report having a good time with some of her family over the weekend. I'm really not sure what has transpired to get me to the point where I'm so anxious and sometimes depressed."  Talked some more today about her grief/sadness over losing her grandmother and how that has affected her life." " I feel so consumed by things I can't change including the way I feel, the way I think , and just not being able to be happy."  Discussed what she wants to change despite her not believing she can change. Looked at some strategies for her between appts to begin some new habits which she did agree would help her but she has a lot of self-doubt about being able to change.  She commits to working on her negative self-talk and becoming more positive in talking her herself, and is to also get out of the house daily for walks or to see family or other people that would be uplifting for her.  Overall not as teaful today as last session. Denies any SI. Affect was brighter towards end of session and seemed more ready to take new steps going forwards.   Goal review and progress/challenges noted with patient.  Next appt within 2 weeks.   Mathis Fare, LCSW

## 2019-11-23 ENCOUNTER — Ambulatory Visit (INDEPENDENT_AMBULATORY_CARE_PROVIDER_SITE_OTHER): Payer: 59 | Admitting: Psychiatry

## 2019-11-23 DIAGNOSIS — F411 Generalized anxiety disorder: Secondary | ICD-10-CM

## 2019-11-23 NOTE — Progress Notes (Addendum)
[facility] Counselor/Therapist Progress Note  Patient ID: Tracy Cohen, MRN: [id number],    Date: 11/23/2019  Time Spent: 45 minutes  8:00am to 9:45am   Virtual Visit Note via MyChart Video: Connected with patient by a video enabled telemedicine/telehealth application or telephone, with their informed consent, and verified patient privacy and that I am speaking with the correct person using two identifiers. I discussed the limitations, risks, security and privacy concerns of performing psychotherapy and management service by telephone and the availability of in person appointments. I also discussed with the patient that there may be a patient responsible charge related to this service. The patient expressed understanding and agreed to proceed. I discussed the treatment planning with the patient. The patient was provided an opportunity to ask questions and all were answered. The patient agreed with the plan and demonstrated an understanding of the instructions. The patient was advised to call  our office if  symptoms worsen or feel they are in a crisis state and need immediate contact.   Therapist Location: [facility] Patient Location: home   Treatment Type: Individual Therapy  Reported Symptoms: anxiety, depression,   Mental Status Exam:  Appearance:   Casual     Behavior:  Appropriate and Sharing  Motor:  Normal  Speech/Language:   Clear and Coherent  Affect:  anxious, depression  Mood:  anxious and depressed  Thought process:  goal directed  Thought content:    WNL  Sensory/Perceptual disturbances:    WNL  Orientation:  oriented to person, place, time/date, situation, day of week, month of year and year  Attention:  Good  Concentration:  Good and Fair  Memory:  WNL  Fund of knowledge:   Good and Fair  Insight:    Good and Fair  Judgment:   Good  Impulse Control:  Good and Fair   Risk Assessment: Danger to Self:  No Self-injurious Behavior: No Danger  to Others: No Duty to Warn:no Physical Aggression / Violence:No  Access to Firearms a concern: No  Gang Involvement:No   Subjective: Patient today reports anxiety and depression, with depression being the stronger symptom.  Denies any thoughts to harm self or others. Having migraines recently and is in touch with her doctor.  Interventions: Cognitive Behavioral Therapy and Solution-Oriented/Positive Psychology  Diagnosis:   ICD-10-CM   1. Generalized anxiety disorder  F41.1     Plan of Care: Patient not signing Treatment Plan on computer screen due to Covid.  Treatment Goals: Treatment goals remain on treatment plan as patient works with strategies to achieve her goals. Progress will be documented each session and noted in "Progress" section of Treatment Plan.  Long term goal: Reduce overall level, frequency, andintensity of the anxiety/depressive thoughts/fearful thinking so that daily functioning is not impaired.  Short term goal: Verbalize an understanding of the role that anxious/depressive/fearful thinking plays in creating fears, excessive worrying, depression, and persistent anxiety symptoms.  Strategy: Identify, challenge, and replace anxious/fearful/depressive thoughts and self-talk with positive, realistic, and empowering thoughts and self-talk that do not support anxiety/depression/fearfulness.  Progress: Patient today reporting depression has increased some but "can't thing of anything that would have caused it."  Also having anxiety and it is more significant when around other people. Still having some migraines and has had difficulty finding [facility].  Encouraged her to check other local [facility] locations to see if they have it in stock and she plans to follow up on  that as well as be in touch with her Dr's office if she can't find medication. Dealing with grandmother's death and feeling "lost in my life right now".  She explains  "lost" feeling as" not knowing where I'm going nor where I need to be." Feels "stagnant" and it's been that way for a while. States she's not able to explain it nor where she wants to be or what she' wants to feel--her reply was "happy, free,  which is usually temporary for me whenever I do feel it, and also spending more time with sister and my mom. ." States that "creating something for myself" would bring me happiness, "something like maybe a business on my own".  Realizing that she does better when focusing on things rather than herself and needing to better determine what's "in my control and what is not." Agreed to take a small step for now and speak with her mom and sister more in depth as to her depressed and anxious feelings, "what helps and what doesn't help", and intentionally spend more time with them outside of her work hours. Denies any SI.  Reviewed some of our last session and she does seem to have some belief that "things might can get better in my life."   Goal review and progress/efforts/challenges noted with patient.  Next appt within 2 weeks.   Mathis Fare, LCSW

## 2019-12-07 ENCOUNTER — Ambulatory Visit (INDEPENDENT_AMBULATORY_CARE_PROVIDER_SITE_OTHER): Payer: 59 | Admitting: Psychiatry

## 2019-12-07 DIAGNOSIS — F331 Major depressive disorder, recurrent, moderate: Secondary | ICD-10-CM

## 2019-12-07 NOTE — Progress Notes (Signed)
[facility] Counselor/Therapist Progress Note  Patient ID: Tracy Cohen, MRN: [id number],    Date: 12/07/2019  Time Spent: 60 minutes    9:00am to 10:00am  Virtual Visit Note via Copywriter, advertising with patient by a video enabled telemedicine/telehealth application or telephone, with their informed consent, and verified patient privacy and that I am speaking with the correct person using two identifiers. I discussed the limitations, risks, security and privacy concerns of performing psychotherapy and management service by telephone and the availability of in person appointments. I also discussed with the patient that there may be a patient responsible charge related to this service. The patient expressed understanding and agreed to proceed. I discussed the treatment planning with the patient. The patient was provided an opportunity to ask questions and all were answered. The patient agreed with the plan and demonstrated an understanding of the instructions. The patient was advised to call our office if symptoms worsen or feel they are in a crisis state and need immediate contact.  Therapist Location: [facility] Patient Location: home   Treatment Type: Individual Therapy  Reported Symptoms: depression, some anxiety, depression some worse, denies any SI  Mental Status Exam:  Appearance:   Casual   Behavior:  Appropriate and Sharing  Motor:  not very mobile as she hasn't been feeling well  Speech/Language:   Clear and Coherent  Affect:  depressed, anxious  Mood:  anxious and depressed  Thought process:  goal directed  Thought content:   WNL  Sensory/Perceptual disturbances:   WNL  Orientation:  oriented to person, place, time/date, situation, day of week, month of year and year  Attention:  Good  Concentration:  Good  Memory:  WNL  Fund of knowledge:   Good  Insight:   Good and Fair  Judgment:   Good  Impulse Control:  Good    Risk Assessment: Danger to Self: No Self-injurious Behavior: No Danger to Others: No Duty to Warn:no Physical Aggression / Violence:No  Access to Firearms a concern: No  Gang Involvement:No   Subjective: Patient    Interventions: Cognitive Behavioral Therapy and Solution-Oriented/Positive Psychology  Diagnosis:   ICD-10-CM   1. Major depressive disorder, recurrent episode, moderate (HCC)  F33.1     Plan of Care: Patient not signing Treatment Plan on computer screen due to Covid.  Treatment Goals: Treatment goals remain on treatment plan as patient works with strategies to achieve her goals. Progress will be documented each session and noted in "Progress" section of Treatment Plan.  Long term goal: Reduce overall level, frequency, andintensity of the anxiety/depressive thoughts/fearful thinking so that daily functioning is not impaired.  Short term goal: Verbalize an understanding of the role that anxious/depressive/fearful thinking plays in creating fears, excessive worrying, depression, and persistent anxiety symptoms.  Strategy: Identify, challenge, and replace anxious/fearful/depressive thoughts and self-talk with positive, realistic, and empowering thoughts and self-talk that do not support anxiety/depression/fearfulness.  Progress:  Patient reporting less anxiety, but more depression.  Denies any suicidal ideation. Having a migraine today and is still [facility] States she is doing better with her grief over Grandmother's death. Feels her depression today is mostly due to her work issues, personal goals and struggles, and her migraines aggravate the situation. Patient processed her depressive thoughts and feelings about her personal goals, work issues, and the struggles that keeps her from making changes. Hard for her to envision changes that might work for her. Encouraged her to consider even some  small  changes that she might could make that would help improve her outlook and increase energy to do something different. Reports she is taking her meds as prescribed. Feels "stuck" in current work, but also hard to entertain other options or resources.  Also encouraged her to be around others more outside of her home.  Has share before that she does better when focusing on things outside of herself and her situation.  Following up from last session, she reports she did not follow through on speaking with her mom and sister more in depth, especially "what helps and what does not help" in her relationship with them."  She did state that she had spent some time with them outside of her work hours but not as much as she had intended due to her headaches which tend to keep her at home, understandably as she experiences pain that limits her.  Patient states that once her current headache gets better she does want to make the efforts to spend more time with her sister and mom.  She states she is currently waiting on a different medicine to come in for migraines.  On a 1-10 depression scale, today she self rates herself as a "7".  On an anxiety scale of 1-10, she has improved and rates herself as a "2".  Continues to deny any suicidal ideation.  Goal review and progress/challenges noted with patient.  Next appt within 2 weeks.   Mathis Fare, LCSW

## 2019-12-21 ENCOUNTER — Ambulatory Visit (INDEPENDENT_AMBULATORY_CARE_PROVIDER_SITE_OTHER): Payer: 59 | Admitting: Psychiatry

## 2019-12-21 DIAGNOSIS — F331 Major depressive disorder, recurrent, moderate: Secondary | ICD-10-CM

## 2019-12-21 NOTE — Progress Notes (Signed)
[facility] Counselor/Therapist Progress Note  Patient ID: Tracy Cohen, MRN: [id number],    Date: 12/21/2019  Time Spent:  60 minutes  9:00am to 10:00am  Virtual Visit Note via MyChart Video Connected with patient by a video enabled telemedicine/telehealth application or telephone, with their informed consent, and verified patient privacy and that I am speaking with the correct person using two identifiers. I discussed the limitations, risks, security and privacy concerns of performing psychotherapy and management service by telephone and the availability of in person appointments. I also discussed with the patient that there may be a patient responsible charge related to this service. The patient expressed understanding and agreed to proceed. I discussed the treatment planning with the patient. The patient was provided an opportunity to ask questions and all were answered. The patient agreed with the plan and demonstrated an understanding of the instructions. The patient was advised to call  our office if  symptoms worsen or feel they are in a crisis state and need immediate contact.   Therapist Location: [facility] Patient Location: home   Treatment Type: Individual Therapy  Reported Symptoms: anxiety, depression  Mental Status Exam:  Appearance:   Casual     Behavior:  Appropriate, Sharing and Motivated  Motor:  Normal  Speech/Language:   Clear and Coherent  Affect:  some anxiety and depression "but some better"  Mood:  anxious, depressed and "have improved some"  Thought process:  normal  Thought content:    WNL  Sensory/Perceptual disturbances:    WNL  Orientation:  oriented to person, place, time/date, situation, day of week, month of year and year  Attention:  Good  Concentration:  Good and Fair  Memory:  WNL  Fund of knowledge:   Good  Insight:    Good and Fair  Judgment:   Good  Impulse Control:  Good   Risk Assessment: Danger to Self:   No Self-injurious Behavior: No Danger to Others: No Duty to Warn:no Physical Aggression / Violence:No  Access to Firearms a concern: No  Gang Involvement:No   Subjective: Patient today reports anxiety and depression, but has made some improvement. Work is "my main stressor" and creates a lot of my anxiety and depression."   Interventions: Solution-Oriented/Positive Psychology and Ego-Supportive  Diagnosis:   ICD-10-CM   1. Major depressive disorder, recurrent episode, moderate (HCC)  F33.1      Plan of Care: Patient not signing Treatment Plan on computer screen due to Covid.  Treatment Goals: Treatment goals remain on treatment plan as patient works with strategies to achieve her goals. Progress will be documented each session and noted in "Progress" section of Treatment Plan.  Long term goal: Reduce overall level, frequency, andintensity of the anxiety/depressive thoughts/fearful thinking so that daily functioning is not impaired.  Short term goal: Verbalize an understanding of the role that anxious/depressive/fearful thinking plays in creating fears, excessive worrying, depression, and persistent anxiety symptoms.  Strategy: Identify, challenge, and replace anxious/fearful/depressive thoughts and self-talk with positive, realistic, and empowering thoughts and self-talk that do not support anxiety/depression/fearfulness.  Progress: Patient today reports her anxiety has decreased some and that has helped.  "Having a better day today, not as anxious and not as depressed but varies from day to day." Reports she is still on her meds and has recently been smoking cbd once per week and feels it helpsher be less anxious and depressed and helps to feel more relaxed."  Has gotten out a little more socially recently "  but not a lot". Reports more energy in looking for different job. Per our video chat, she appears less unhappy and less distressed today, no tearfulness.  States that  work remains her biggest stressor and is very closely linked to her depression and anxiety.  Last session she was having a really bad migraine and today she reports no further migraine since that time and that she was able to get her [facility] that was an [facility]  Patient processed more of her personal goals today and the type steps she is taking to contemplate a different type of job, is speaking to other people about this and hopes to find something that will be a good match for her.  With some increased hopefulness, she presents quite differently today with some more optimism.  Smiles appropriately and showing some increased energy.  Encouraging her to get outside of her home at least some each day and to work on more positive self talk, along with self-care that includes out reach to family and at least a couple of friends as she can, physical movement, healthy nutrition, taking meds as prescribed, and working to focus more on what she can control or change versus what she cannot.  On a 1-10 depression scale today, she self rates herself as a "4-5 ".  On an anxiety scale of 1-10, she rates herself as a "2 ".  Goal review and progress/challenges noted with patient.  Next appt within 2-3 weeks.   Mathis Fare, LCSW

## 2020-01-04 ENCOUNTER — Ambulatory Visit: Payer: 59 | Admitting: Psychiatry

## 2020-01-05 ENCOUNTER — Ambulatory Visit (INDEPENDENT_AMBULATORY_CARE_PROVIDER_SITE_OTHER): Payer: 59 | Admitting: Psychiatry

## 2020-01-05 ENCOUNTER — Ambulatory Visit: Payer: 59 | Admitting: Psychiatry

## 2020-01-05 DIAGNOSIS — F331 Major depressive disorder, recurrent, moderate: Secondary | ICD-10-CM

## 2020-01-05 NOTE — Progress Notes (Signed)
[facility] Counselor/Therapist Progress Note  Patient ID: Tracy Cohen, MRN: [id number],    Date: 01/05/2020  Time Spent: 60 minutes   9:00am to 10:00am  Virtual Visit Note via MyChart Connected with patient by a video enabled telemedicine/telehealth application or telephone, with their informed consent, and verified patient privacy and that I am speaking with the correct person using two identifiers. I discussed the limitations, risks, security and privacy concerns of performing psychotherapy and management service by telephone and the availability of in person appointments. I also discussed with the patient that there may be a patient responsible charge related to this service. The patient expressed understanding and agreed to proceed. I discussed the treatment planning with the patient. The patient was provided an opportunity to ask questions and all were answered. The patient agreed with the plan and demonstrated an understanding of the instructions. The patient was advised to call  our office if  symptoms worsen or feel they are in a crisis state and need immediate contact.   Therapist Location: [facility] Patient Location: home   Treatment Type: Individual Therapy  Reported Symptoms: depression, anxiety, sadness/grief   Mental Status Exam:  Appearance:   Casual     Behavior:  Appropriate and Sharing  Motor:  Normal  Speech/Language:   Clear and Coherent  Affect:  depression, anxiety  Mood:  anxious and depressed  Thought process:  goal directed  Thought content:    WNL  Sensory/Perceptual disturbances:    WNL  Orientation:  oriented to person, place, time/date, situation, day of week, month of year and year  Attention:  Fair  Concentration:  Fair  Memory:  WNL  Fund of knowledge:   Good  Insight:    Fair  Judgment:   Fair  Impulse Control:  Good and Fair   Risk Assessment: Danger to Self:  No Self-injurious Behavior: No Danger to Others: No Duty  to Warn:no Physical Aggression / Violence:No  Access to Firearms a concern: No  Gang Involvement:No   Subjective: Patient today reporting anxiety, depression, sadness/grief, roller coaster of emotions.  Cousin committed suicide recently, which has added to her depression. Patient denies any SI.  Interventions: Cognitive Behavioral Therapy and Solution-Oriented/Positive Psychology  Diagnosis:   ICD-10-CM   1. Major depressive disorder, recurrent episode, moderate (HCC)  F33.1     Plan of Care: Patient not signing Treatment Plan on computer screen due to Covid.  Treatment Goals: Treatment goals remain on treatment plan as patient works with strategies to achieve her goals. Progress will be documented each session and noted in "Progress" section of Treatment Plan.  Long term goal: Reduce overall level, frequency, andintensity of the anxiety/depressive thoughts/fearful thinking so that daily functioning is not impaired.  Short term goal: Verbalize an understanding of the role that anxious/depressive/fearful thinking plays in creating fears, excessive worrying, depression, and persistent anxiety symptoms.  Strategy: Identify, challenge, and replace anxious/fearful/depressive thoughts and self-talk with positive, realistic, and empowering thoughts and self-talk that do not support anxiety/depression/fearfulness.  Progress: Patient today quite tearful at onset of session and relates it's "been a rough couple of weeks." Depression is her strongest feeling  Reports difficulty in following through on prior suggestions/strategies to work on treatment goals. More depressed but denies any SI. Tearful intermittently, sleeping ok some nights but often does not sleep well and wakes up more tired, self-esteem low, motivation low. Did get out of house some yesterday but was anxious to get back home.  Having phone  issues which aggravates her and leads to more feeling detatched and alone. Plans  to follow up during her lunch break and see about a new phone. Also states she wants to help others but they always tend to depend on her to help them "in their troubles but never seem available to patient.  Actually was very open in talking about her depressive feelings and stated twice during session that she was not feeling any SI.  I did remind her if she ever reach the point of needing more immediate care, that there are after-hours services available through [facility] health emergency department.  She is not feeling very energetic nor motivated but did agree to at least check on her phone today and replace if necessary so that she does not feel so cut off from people.  She also is committed to getting started on her workday there at home as she is still working from home each day Monday through Friday.  Patient agreed to contact the friend that she is closer to, later today so that she can have some direct contact with her and feel the support of a friend.  Patient is to be scheduling an appointment later today with her medication provider.  Seem to feel more grounded and I noticed on her video that she was smiling some towards the end of session and actually had found a piece of paper to write down some notes on during session including the commitments above that she is to follow through on.  On a 1-10 depression scale today she self rates herself as a "7" and on an anxiety scale of 1-10 she self rates herself as a "3".  Encouraged good self-care including physical and emotional, with continued work to make herself talk more positive and self affirming.  Goal review and progress/challenges noted with patient.  Next appt within 2 weeks.   Mathis Fare, LCSW

## 2020-01-25 ENCOUNTER — Ambulatory Visit: Payer: 59 | Admitting: Psychiatry

## 2020-01-27 ENCOUNTER — Ambulatory Visit: Payer: 59 | Admitting: Psychiatry

## 2020-01-27 ENCOUNTER — Encounter: Payer: 59 | Admitting: Psychiatry

## 2020-01-27 NOTE — Progress Notes (Signed)
This encounter was created in error - please disregard.

## 2020-01-27 NOTE — Progress Notes (Deleted)
      [facility] Counselor/Therapist Progress Note  Patient ID: MAGGIE SENSENEY, MRN: [id number],    Date: 01/27/2020  Time Spent:   Treatment Type: erroneous encounter  Reported Symptoms: ***  Mental Status Exam:  Appearance:   {PSY:22683}     Behavior:  {PSY:21022743}  Motor:  {PSY:22302}  Speech/Language:   {PSY:22685}  Affect:  {PSY:22687}  Mood:  {PSY:31886}  Thought process:  {PSY:31888}  Thought content:    {PSY:[phone]}  Sensory/Perceptual disturbances:    {PSY:[phone]}  Orientation:  {PSY:30297}  Attention:  {PSY:22877}  Concentration:  {PSY:3036189364}  Memory:  {[phone]}  Fund of knowledge:   {PSY:3036189364}  Insight:    {PSY:3036189364}  Judgment:   {PSY:3036189364}  Impulse Control:  {PSY:3036189364}   Risk Assessment: Danger to Self:  {PSY:22692} Self-injurious Behavior: {PSY:22692} Danger to Others: {PSY:22692} Duty to Warn:{PSY:311194} Physical Aggression / Violence:{PSY:21197} Access to Firearms a concern: {PSY:21197} Gang Involvement:{PSY:21197}  Subjective: ***   Interventions: {PSY:[phone]}  Diagnosis:   ICD-10-CM   1. Erroneous encounter - disregard  ERROR EN     Plan: ***  Mathis Fare, LCSW                   This encounter was created in error - please disregard. This encounter was created in error - please disregard. This encounter was created in error - please disregard.

## 2020-02-10 NOTE — Progress Notes (Deleted)
NEUROLOGY FOLLOW UP OFFICE NOTE  Tracy Cohen [id number]  HISTORY OF PRESENT ILLNESS: Tracy Cohen is a 29 year old female whofollows up for migraine.  UPDATE: Intensity:7-8/10 Duration:3 to 5 hours but mostly not severe Frequency:2 days a month Frequency of abortive medication:4 days a month Current NSAIDS:2 Aleve Current analgesics:none Current triptans:none Current ergotamine:none Current anti-emetic:none Current muscle relaxants:none Current anti-anxiolytic:none Current sleep aide:none Current Antihypertensive medications:none Current Antidepressant/antipsychotic none Current Anticonvulsant medications:none Current anti-CGRP:Aimovig 70mg ; Nurtec  Current Vitamins/Herbal/Supplements:none Current Antihistamines/Decongestants:none Other therapy:none Hormone/birth control:Xulane patch  Caffeine:No Alcohol:no Smoker:no Diet:Water, protein, vegetables Exercise:Yes. Has a trainer. Depression:yes; Anxiety:yes Other pain:no Sleep hygiene:ok   HISTORY: Onset:Occasionallyfrom age 74 through teenager. Became more frequent in her mid 30s. Location:Start bifrontal/temporal Quality:Pounding and squeezing Intensity:10/10. Shedenies new headache, thunderclap headache  Aura:no Premonitory Phase:no Postdrome:no Associated symptoms:Dizziness, lightheadedness, nausea, vomiting, photophobia, phonophobia, osmophobia, vision loss (blurred vision to black), numbness in legs.Shedenies associated unilateral weakness. Duration:Usually 48 to 72 hours. Anywhere from 1 1/2 days up to 3 months Frequency:2 to 3 times a month (10 days a month) Frequency of abortive medication:10 days a month Triggers:Anything with red dye, sometimes caffeine, aged cheeses Relieving factors:Laying down in dark and quiet room Activity:Aggravates. 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  Past NSAIDS:ibuprofen Past analgesics:Excedrin, Tylenol Past abortive triptans:Maxalt, Zomig, sumatriptantablet Past abortive ergotamine:none Past muscle relaxants:none Past anti-emetic:Zofran, Promethazine Past antihypertensive medications:none Past antidepressant/antipsychoticmedications: Nortriptyline, Seroquel Past anticonvulsant medications:topiramate Past anti-CGRP:none Past vitamins/Herbal/Supplements:none Past antihistamines/decongestants:none Other past therapies:none   Family history of headache:Mom (migraines later in life)  PAST MEDICAL HISTORY: Past Medical History:  Diagnosis Date  . Allergy   . Anxiety   . Depression   . Migraine     MEDICATIONS: Current Outpatient Medications on File Prior to Visit  Medication Sig Dispense Refill  . AIMOVIG 70 MG/ML SOAJ INJECT 70 MG INTO THE SKIN EVERY 30 (THIRTY) DAYS. 3 pen 3  . ALPRAZolam (XANAX) 0.5 MG tablet Take 1 tablet (0.5 mg total) by mouth 3 (three) times daily as needed for anxiety. 90 tablet 2  . cetirizine (ZYRTEC) 5 MG tablet Take 5 mg by mouth daily.    02/07/17 FLUoxetine (PROZAC) 10 MG capsule Take three capsules daily. 90 capsule 2  . omeprazole (PRILOSEC) 20 MG capsule Take 1 capsule (20 mg total) by mouth as directed. Take 2 capsule (40 mg total) 2 times daily for 4 weeks then decrease to 1 capsule (20 mg total) 2 times daily for 2 weeks 140 capsule 0  . Rimegepant Sulfate (NURTEC) 75 MG TBDP Take 1 tablet by mouth daily as needed (Maximum 1 tablet in 24 hours). 8 tablet 3  . tinidazole (TINDAMAX) 500 MG tablet Take 500 mg by mouth 2 (two) times daily.    [name] 150-35 MCG/24HR transdermal patch 1 (ONE) PATCH WEEKLY FOR 3 WEEKS AS DIRECTED     No current facility-administered medications on file prior to visit.     ALLERGIES: No Known Allergies  FAMILY HISTORY: Family History  Problem Relation Age of Onset  . High blood pressure Mother   . Mental illness Father        schizophrenia  . Esophageal cancer Father   . Mental illness Maternal Grandmother        bipolar  . Kidney cancer Maternal Grandmother   . Cancer Maternal Grandfather   .  Mental illness Paternal Grandmother   . Cancer Paternal Grandmother   . Mental illness Paternal Grandfather   . Colon cancer Neg Hx     SOCIAL HISTORY: Social History   Socioeconomic History  . Marital status: Single    Spouse name: Not on file  . Number of children: 0  . Years of education: 31  . Highest education level: Associate degree: occupational, Scientist, product/process development, or vocational program  Occupational History    Employer: [employer]  Tobacco Use  . Smoking status: Smoker, Current Status Unknown  . Smokeless tobacco: Never Used  Vaping Use  . Vaping Use: Never used  Substance and Sexual Activity  . Alcohol use: Yes    Alcohol/week: 0.0 standard drinks    Comment: 2-3 drinks/wk  . Drug use: Not Currently  . Sexual activity: Yes    Birth control/protection: Condom, Patch    Comment: last intercourse July 28 2015( condom broke)  Other Topics Concern  . Not on file  Social History Narrative   Patient lives at home alone.    Patient works for [employer]   Patient has some college education.   Right handed.      Patient now works for [employer] and has received an associated an degree.      Social Determinants of Health   Financial Resource Strain:   . Difficulty of Paying Living Expenses: Not on file  Food Insecurity:   . Worried About Programme researcher, broadcasting/film/video in the Last Year: Not on file  . Ran Out of Food in the Last Year: Not on file  Transportation Needs:   . Lack of Transportation (Medical): Not on file  . Lack of Transportation (Non-Medical): Not on file  Physical Activity:   . Days of Exercise per Week: Not on file  . Minutes  of Exercise per Session: Not on file  Stress:   . Feeling of Stress : Not on file  Social Connections:   . Frequency of Communication with Friends and Family: Not on file  . Frequency of Social Gatherings with Friends and Family: Not on file  . Attends Religious Services: Not on file  . Active Member of Clubs or Organizations: Not on file  . Attends Banker Meetings: Not on file  . Marital Status: Not on file  Intimate Partner Violence:   . Fear of Current or Ex-Partner: Not on file  . Emotionally Abused: Not on file  . Physically Abused: Not on file  . Sexually Abused: Not on file    PHYSICAL EXAM: *** General: No acute distress.  Patient appears well-groomed.   Head:  Normocephalic/atraumatic Eyes:  Fundi examined but not visualized Neck: supple, no paraspinal tenderness, full range of motion Heart:  Regular rate and rhythm Lungs:  Clear to auscultation bilaterally Back: No paraspinal tenderness Neurological Exam: alert and oriented to person, place, and time. Attention span and concentration intact, recent and remote memory intact, fund of knowledge intact.  Speech fluent and not dysarthric, language intact.  CN II-XII intact. Bulk and tone normal, muscle strength 5/5 throughout.  Sensation to light touch, temperature and vibration intact.  Deep tendon reflexes 2+ throughout, toes downgoing.  Finger to nose and heel to shin testing intact.  Gait normal, Romberg negative.  IMPRESSION: Migraine without aura, without status migrainosus, not intractable  PLAN: 1.  For preventative management, Aimovig 70mg  2.  For abortive therapy, Nurtec 3.  Limit use of pain relievers to no more than 2 days out  of week to prevent risk of rebound or medication-overuse headache. 4.  Keep headache diary 5.  Exercise, hydration, caffeine cessation, sleep hygiene, monitor for and avoid triggers 6. Follow up ***   Shon Millet, DO  CC:  Luis Abed, DO

## 2020-02-14 ENCOUNTER — Ambulatory Visit: Payer: 59 | Admitting: Neurology

## 2020-02-19 ENCOUNTER — Ambulatory Visit: Payer: 59 | Attending: Internal Medicine

## 2020-02-19 ENCOUNTER — Other Ambulatory Visit: Payer: Self-pay

## 2020-02-19 DIAGNOSIS — Z23 Encounter for immunization: Secondary | ICD-10-CM

## 2020-02-19 NOTE — Progress Notes (Signed)
   Covid-19 Vaccination Clinic  Name:  Tracy Cohen    MRN: [id number] DOB: Jun 10, 1990  02/19/2020  [name] was observed post Covid-19 immunization for 15 minutes without incident. She was provided with Vaccine Information Sheet and instruction to access the V-Safe system.   [name] was instructed to call 911 with any severe reactions post vaccine: [name] Difficulty breathing  . Swelling of face and throat  . A fast heartbeat  . A bad rash all over body  . Dizziness and weakness   Immunizations Administered    Name Date Dose VIS Date Route   Pfizer COVID-19 Vaccine 02/19/2020 12:30 PM 0.3 mL 06/30/2018 Intramuscular   Manufacturer: [employer], [employer]   Lot: [id number]   NDC: [address]-4

## 2020-03-18 ENCOUNTER — Other Ambulatory Visit: Payer: Self-pay

## 2020-03-18 ENCOUNTER — Ambulatory Visit: Payer: 59 | Attending: Internal Medicine

## 2020-03-18 DIAGNOSIS — Z23 Encounter for immunization: Secondary | ICD-10-CM

## 2020-03-18 NOTE — Progress Notes (Signed)
   Covid-19 Vaccination Clinic  Name:  LORINE IANNACCONE    MRN: [id number] DOB: 24-Feb-1991  03/18/2020  Ms. Stefanko was observed post Covid-19 immunization for 15 minutes without incident. She was provided with Vaccine Information Sheet and instruction to access the V-Safe system.   Ms. Linney was instructed to call 911 with any severe reactions post vaccine: [name] Difficulty breathing  . Swelling of face and throat  . A fast heartbeat  . A bad rash all over body  . Dizziness and weakness   Immunizations Administered    Name Date Dose VIS Date Route   Pfizer COVID-19 Vaccine 03/18/2020 12:36 PM 0.3 mL 02/23/2020 Intramuscular   Manufacturer: [employer], [employer]   Lot: [id number]   NDC: [address]-5

## 2020-03-25 IMAGING — CT CT ABDOMEN AND PELVIS WITH CONTRAST
2 of 4 series · 16 of 46 positions shown, 18 images · IV contrast (ISOVUE)
Comparison: None available.

CLINICAL DATA: Initial evaluation for acute emesis and upper
abdominal pain.

EXAM:
CT ABDOMEN AND PELVIS WITH CONTRAST
TECHNIQUE: Multidetector CT imaging of the abdomen and pelvis was performed
using the standard protocol following bolus administration of
intravenous contrast.
CONTRAST:  100mL LHRL98-WFF IOPAMIDOL (LHRL98-WFF) INJECTION 61%

[Series 2: axial st · axial · 0.87mm/px · z∈[+1029,+1454]mm · 13 of 95 slices shown, 15 images]
[im 5/95  soft-tissue]
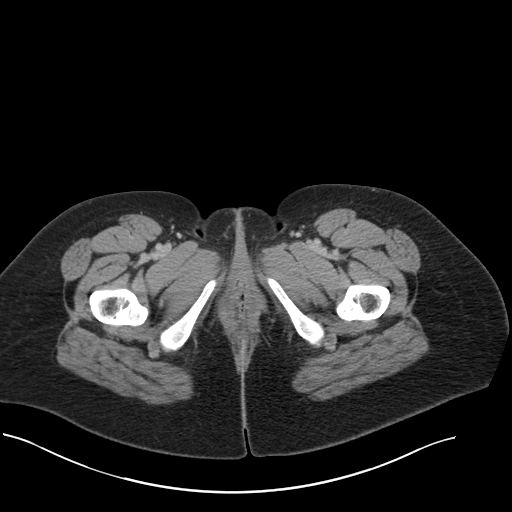
[im 5/95  bone]
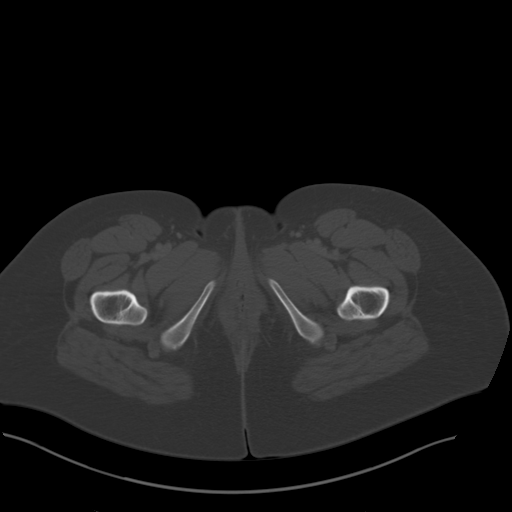
[im 15/95  soft-tissue]
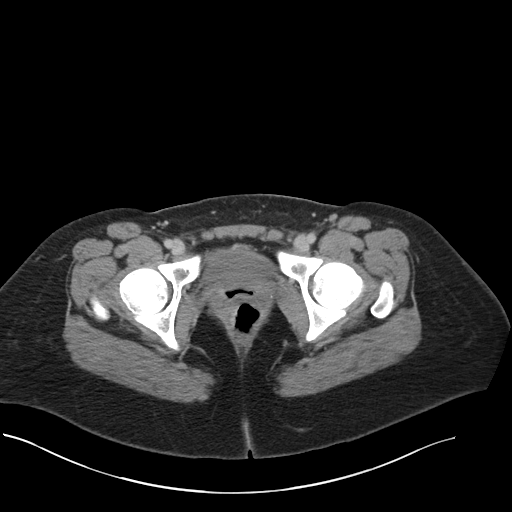
[im 19/95  soft-tissue]
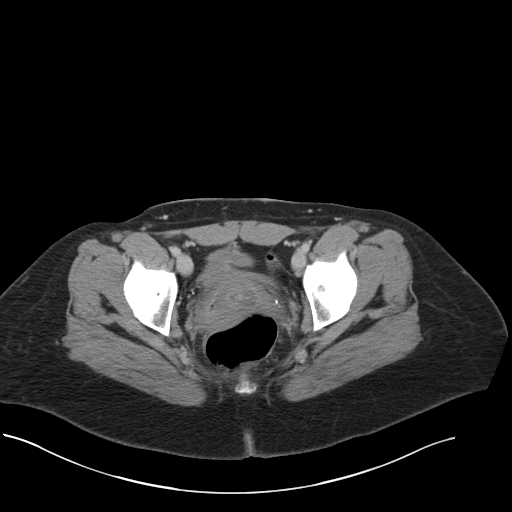
[im 29/95  soft-tissue]
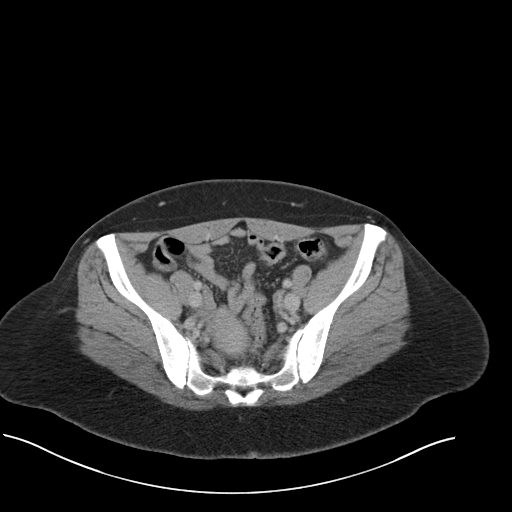
[im 33/95  soft-tissue]
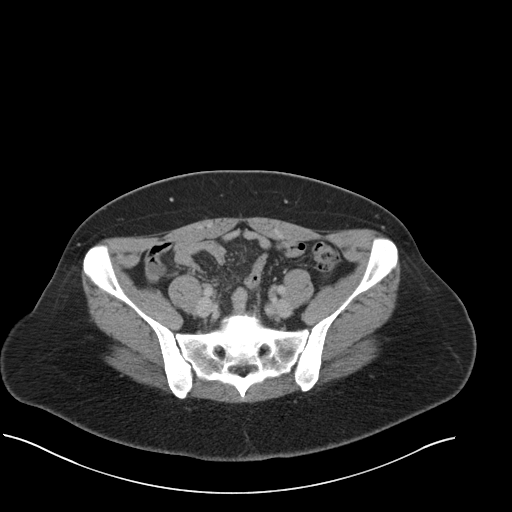
[im 43/95  soft-tissue]
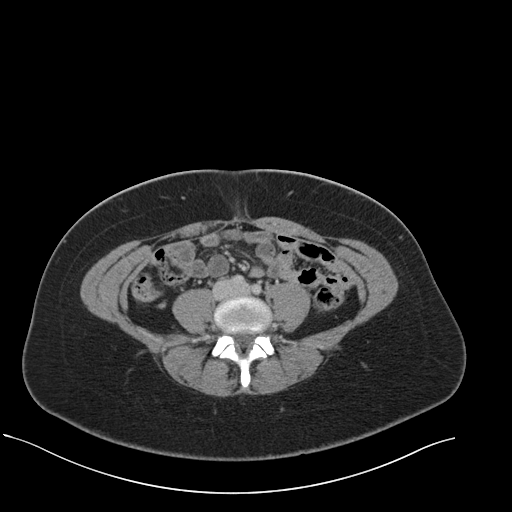
[im 48/95  soft-tissue]
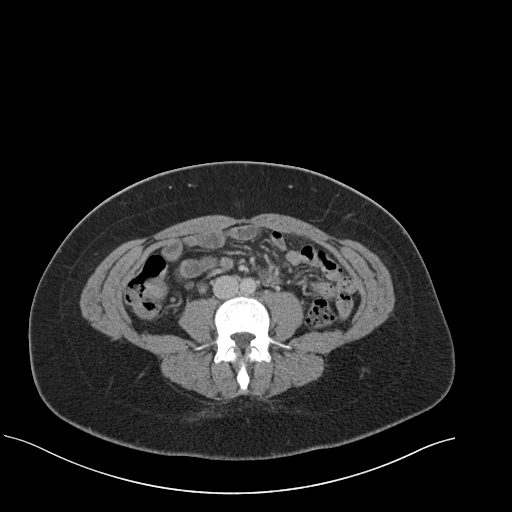
[im 52/95  soft-tissue]
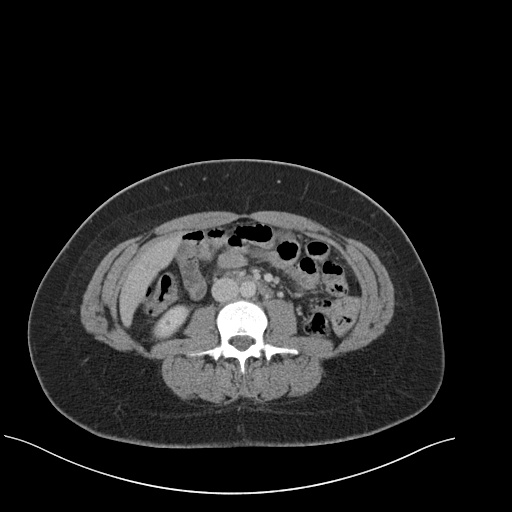
[im 62/95  soft-tissue]
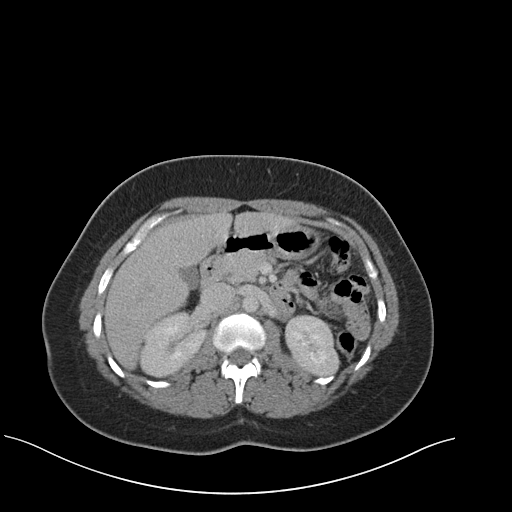
[im 62/95  bone]
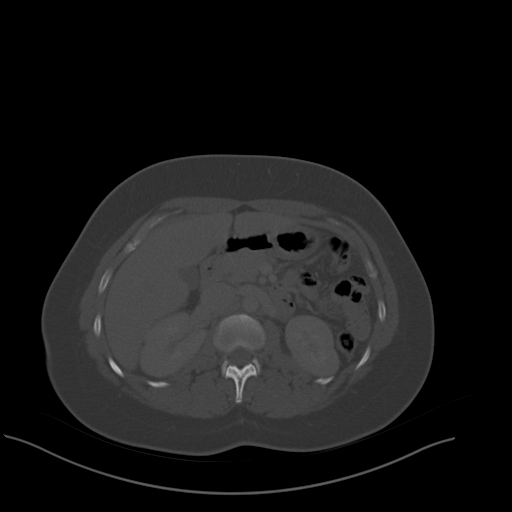
[im 66/95  soft-tissue]
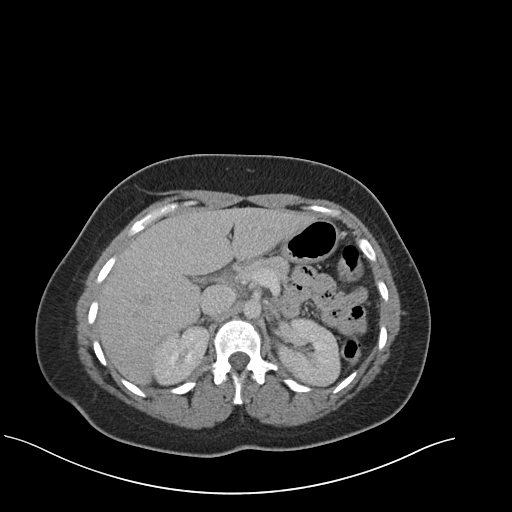
[im 76/95  soft-tissue]
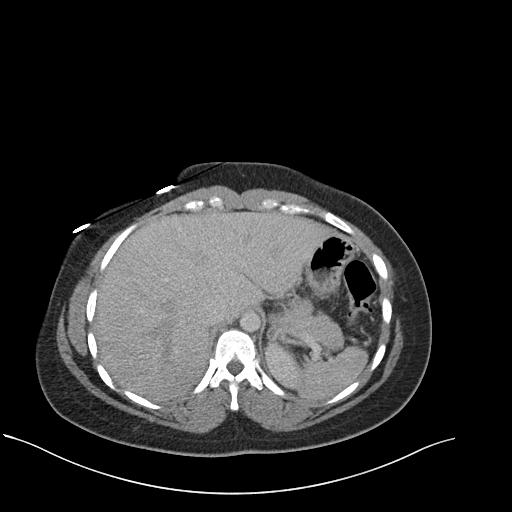
[im 80/95  soft-tissue]
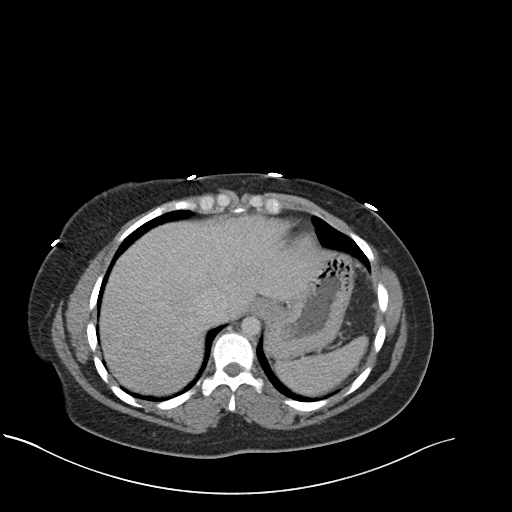
[im 90/95  soft-tissue]
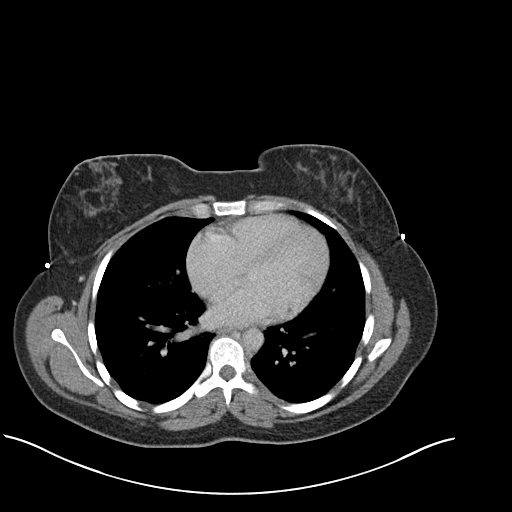

[Series 5: coronal st · coronal · 0.71mm/px · 3 of 136 slices shown]
[im 46/136  soft-tissue]
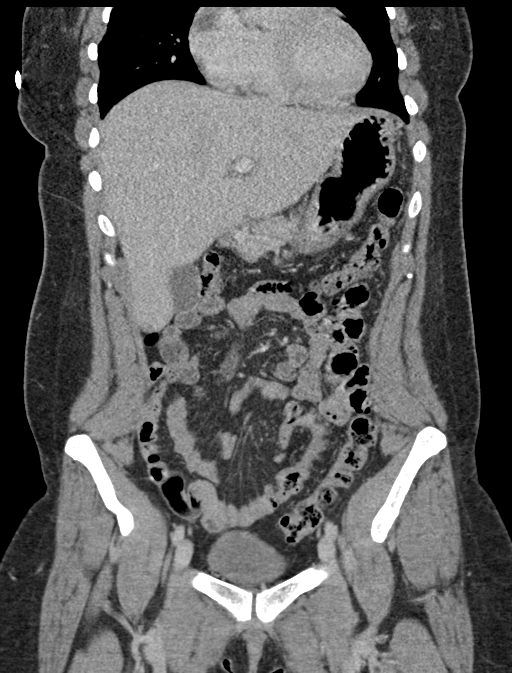
[im 61/136  soft-tissue]
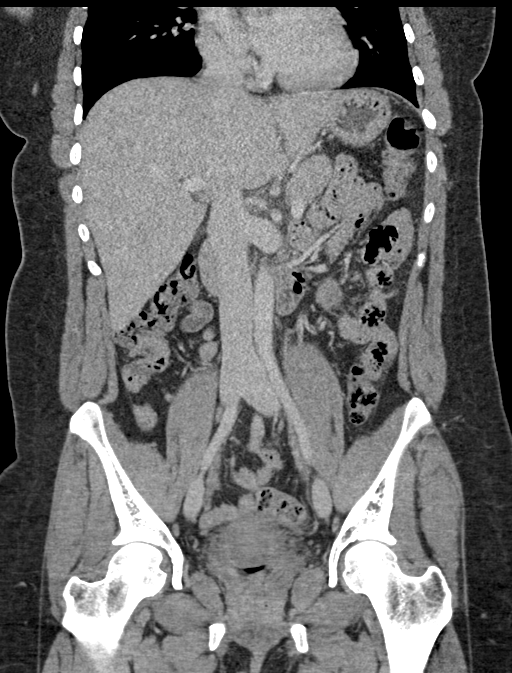
[im 76/136  soft-tissue]
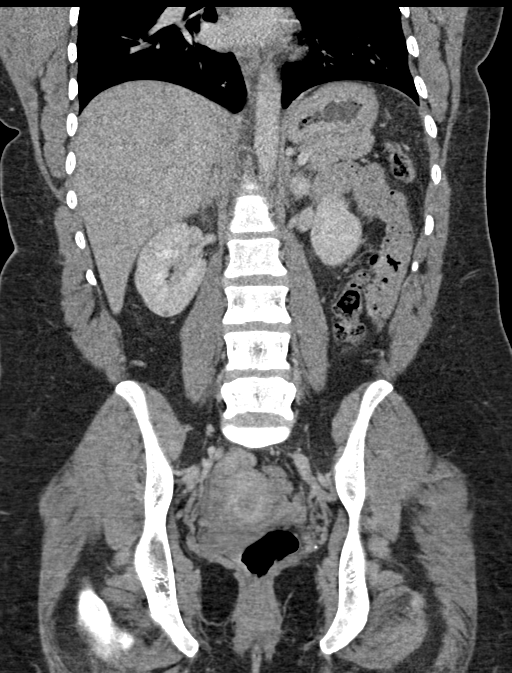

[16 of 46 positions shown; findings below may reference images not displayed]

FINDINGS: Lower chest: Visualized lung bases are clear.

Hepatobiliary: Liver demonstrates a normal contrast enhanced
appearance. Gallbladder within normal limits. No biliary dilatation.

Pancreas: Pancreas within normal limits.

Spleen: Spleen within normal limits.

Adrenals/Urinary Tract: Adrenal glands are normal. Kidneys equal in
size with symmetric enhancement. Subcentimeter hypodensity at the
lower pole the right kidney too small the characterize, but
statistically likely reflects a small cyst. No nephrolithiasis,
hydronephrosis, or focal enhancing renal mass. No hydroureter.
Bladder largely decompressed without acute finding.

Stomach/Bowel: Stomach within normal limits. No evidence for bowel
obstruction. Normal appendix. No acute inflammatory changes seen
about the bowels.

Vascular/Lymphatic: Normal intravascular enhancement seen throughout
the intra-abdominal aorta and its branch vessels. No adenopathy.

Reproductive: Uterus and ovaries within normal limits for age.

Other: No free air or fluid.

Musculoskeletal: No acute osseous finding. No discrete lytic or
blastic osseous lesions.
IMPRESSION: No CT evidence for acute intra-abdominal or pelvic process.

## 2020-03-27 ENCOUNTER — Encounter: Payer: Self-pay | Admitting: Neurology

## 2020-03-27 NOTE — Progress Notes (Addendum)
Chasya Proia (Key: [id number]) Aimovig 70MG /ML auto-injectors   Form [employer] Electronic PA Form [phone] NCPDP) Created 19 hours ago Sent to Plan 19 hours ago Plan Response 19 hours ago Submit Clinical Questions 19 hours ago Determination Favorable 14 hours ago Message from ([phone] Your PA request has been approved. Additional information will be provided in the approval communication. (Message 1145)  Received fax approval valid from 03/27/20 to 03/27/21.

## 2020-05-09 ENCOUNTER — Other Ambulatory Visit: Payer: Self-pay | Admitting: Adult Health

## 2020-05-09 DIAGNOSIS — G4700 Insomnia, unspecified: Secondary | ICD-10-CM

## 2020-05-09 DIAGNOSIS — F4010 Social phobia, unspecified: Secondary | ICD-10-CM

## 2020-05-09 DIAGNOSIS — F411 Generalized anxiety disorder: Secondary | ICD-10-CM

## 2020-05-29 ENCOUNTER — Other Ambulatory Visit: Payer: Self-pay | Admitting: Neurology

## 2020-05-29 ENCOUNTER — Telehealth: Payer: Self-pay | Admitting: Neurology

## 2020-05-29 NOTE — Telephone Encounter (Signed)
Pt states that [facility] on [address] told her to call our office about her Aimovig refill. They did not tell her why she need to call our office  Please call patient

## 2020-05-29 NOTE — Telephone Encounter (Signed)
Spoke with patient needs Rx sent in when [employer] goes back up.

## 2020-05-29 NOTE — Telephone Encounter (Signed)
Per chart she has approval on file for Aimovig until 03/27/21.

## 2020-05-29 NOTE — Telephone Encounter (Signed)
Can you see in another prior is needed.

## 2020-05-30 ENCOUNTER — Other Ambulatory Visit: Payer: Self-pay

## 2020-05-30 MED ORDER — AIMOVIG 70 MG/ML ~~LOC~~ SOAJ: 70.0000 mg | SUBCUTANEOUS | 3 refills | Status: DC

## 2020-07-03 NOTE — Progress Notes (Signed)
NEUROLOGY FOLLOW UP OFFICE NOTE  Tracy Cohen [id number]  Assessment/Plan:   Migraine without aura, without status migrainosus, not intractable  1.  Migraine prevention:  Aimovig 70mg  2.  Migraine rescue:  Nurtec 3.  Limit use of pain relievers to no more than 2 days out of week to prevent risk of rebound or medication-overuse headache. 4.  Keep headache diary 5.  Follow up 1 year  Subjective:  Tracy Cohen is a 30 year old female whofollows up for migraines.38  UPDATE: Intensity:7-8/10 Duration:1 hour with Nurtec Frequency:1-2 days a month Frequency of abortive medication:4 days a month Current NSAIDS:2 Aleve Current analgesics:none Current triptans:none Current ergotamine:none Current anti-emetic:none Current muscle relaxants:none Current anti-anxiolytic:none Current sleep aide:none Current Antihypertensive medications:none Current Antidepressant/antipsychotic none Current Anticonvulsant medications:none Current anti-CGRP:Aimovig 70mg ; Nurtec  Current Vitamins/Herbal/Supplements:none Current Antihistamines/Decongestants:none Other therapy:none Hormone/birth control:Xulane patch  Caffeine:No Alcohol:no Smoker:no Diet:Water, protein, vegetables Exercise:Yes. Has a trainer. Depression:yes; Anxiety:yes Other pain:no Sleep hygiene:ok   HISTORY: Onset:Occasionallyfrom age 59 through teenager. Became more frequent in her mid 79s. Location:Start bifrontal/temporal Quality:Pounding and squeezing Intensity:10/10. Shedenies new headache, thunderclap headache  Aura:no Premonitory Phase:no Postdrome:no Associated symptoms:Dizziness, lightheadedness, nausea, vomiting, photophobia, phonophobia, osmophobia, vision loss (blurred vision to black), numbness in legs.Shedenies associated unilateral weakness. Duration:Usually 48 to 72 hours. Anywhere from 1 1/2 days up to 3  months Frequency:2 to 3 times a month (10 days a month) Frequency of abortive medication:10 days a month Triggers:Anything with red dye, sometimes caffeine, aged cheeses Relieving factors:Laying down in dark and quiet room Activity:Aggravates. Misses 4 days of work a monthaimo  12/08/16 MRI of brain without contrast personally reviewed and demonstrated incidental finding of cerebellar tonsils extending 4 mm below the foramen magnum but otherwise unremarkable.  Past NSAIDS:ibuprofen Past analgesics:Excedrin, Tylenol Past abortive triptans:Maxalt, Zomig, sumatriptantablet Past abortive ergotamine:none Past muscle relaxants:none Past anti-emetic:Zofran, Promethazine Past antihypertensive medications:none Past antidepressant/antipsychoticmedications: Nortriptyline, Seroquel Past anticonvulsant medications:topiramate Past anti-CGRP:none Past vitamins/Herbal/Supplements:none Past antihistamines/decongestants:none Other past therapies:none   Family history of headache:Mom (migraines later in life)  PAST MEDICAL HISTORY: Past Medical History:  Diagnosis Date  . Allergy   . Anxiety   . Depression   . Migraine     MEDICATIONS: Current Outpatient Medications on File Prior to Visit  Medication Sig Dispense Refill  . ALPRAZolam (XANAX) 0.5 MG tablet Take 1 tablet (0.5 mg total) by mouth 3 (three) times daily as needed for anxiety. 90 tablet 2  . cetirizine (ZYRTEC) 5 MG tablet Take 5 mg by mouth daily.    38s (AIMOVIG) 70 MG/ML SOAJ Inject 70 mg into the skin every 30 (thirty) days. 3 mL 3  . FLUoxetine (PROZAC) 10 MG capsule Take three capsules daily. 90 capsule 2  . omeprazole (PRILOSEC) 20 MG capsule Take 1 capsule (20 mg total) by mouth as directed. Take 2 capsule (40 mg total) 2 times daily for 4 weeks then decrease to 1 capsule (20 mg total) 2 times daily for 2 weeks 140 capsule 0  . Rimegepant Sulfate (NURTEC) 75 MG TBDP Take 1  tablet by mouth daily as needed (Maximum 1 tablet in 24 hours). 8 tablet 3  . tinidazole (TINDAMAX) 500 MG tablet Take 500 mg by mouth 2 (two) times daily.    02/07/17 150-35 MCG/24HR transdermal patch 1 (ONE) PATCH WEEKLY FOR 3 WEEKS AS DIRECTED     No current facility-administered medications on file prior to visit.    ALLERGIES: No Known Allergies  FAMILY HISTORY: Family History  Problem Relation Age of  Onset  . High blood pressure Mother   . Mental illness Father        schizophrenia  . Esophageal cancer Father   . Mental illness Maternal Grandmother        bipolar  . Kidney cancer Maternal Grandmother   . Cancer Maternal Grandfather   . Mental illness Paternal Grandmother   . Cancer Paternal Grandmother   . Mental illness Paternal Grandfather   . Colon cancer Neg Hx     Objective:  Blood pressure 114/73, pulse 68, resp. rate 18, height 5\' 4"  (1.626 m), weight 163 lb (73.9 kg), SpO2 98 %. General: No acute distress.  Patient appears well-groomed.       , DO  CC: Shon Millet, DO

## 2020-07-04 ENCOUNTER — Other Ambulatory Visit: Payer: Self-pay

## 2020-07-04 ENCOUNTER — Ambulatory Visit: Payer: BC Managed Care – PPO | Admitting: Neurology

## 2020-07-04 ENCOUNTER — Encounter: Payer: Self-pay | Admitting: Neurology

## 2020-07-04 VITALS — BP 114/73 | HR 68 | Resp 18 | Ht 64.0 in | Wt 163.0 lb

## 2020-07-04 DIAGNOSIS — G43009 Migraine without aura, not intractable, without status migrainosus: Secondary | ICD-10-CM | POA: Diagnosis not present

## 2020-07-04 MED ORDER — AIMOVIG 70 MG/ML ~~LOC~~ SOAJ: 70.0000 mg | SUBCUTANEOUS | 1 refills | Status: DC

## 2020-07-04 MED ORDER — NURTEC 75 MG PO TBDP: 1.0000 | ORAL_TABLET | Freq: Every day | ORAL | 5 refills | Status: DC | PRN

## 2020-07-04 NOTE — Patient Instructions (Signed)
1.  Refilled Aimovig 70mg  every 28 days 2.  Refilled Nurtec 3.  Follow up one year

## 2020-07-04 NOTE — Progress Notes (Signed)
Candela Samet ([facility]) Rx #: [id number] Nurtec 75MG  dispersible tablets   Form [employer] Electronic PA Form [phone] NCPDP) Created 1 hour ago Sent to Plan 13 minutes ago Plan Response 13 minutes ago Submit Clinical Questions 11 minutes ago Determination Favorable 10 minutes ago Message from ([phone] Your PA request has been approved. Additional information will be provided in the approval communication. (Message 1145)

## 2020-07-13 ENCOUNTER — Telehealth: Payer: Self-pay | Admitting: Neurology

## 2020-07-13 NOTE — Telephone Encounter (Signed)
Patient called and left a message with access nurse and stated that she was not seen to long ago and is having issues with her migraines. She states she has taken the medicine that the MD prescribed but it is still not helping and wants to know what else to do. She says the pain is unbearable.

## 2020-07-13 NOTE — Telephone Encounter (Signed)
Called patient and left a message for a call back.  

## 2020-07-13 NOTE — Telephone Encounter (Signed)
She can come in for a headache cocktail if she has a driver.

## 2020-07-14 NOTE — Telephone Encounter (Signed)
LMOVM Please call the office if you want to schedule a visit.

## 2020-07-17 NOTE — Telephone Encounter (Signed)
MyChart message sent to patient due to no call backs.

## 2020-08-02 ENCOUNTER — Encounter: Payer: Self-pay | Admitting: Neurology

## 2020-08-02 NOTE — Progress Notes (Signed)
RANE DUMM (Key: [id number]) Aimovig 70MG /ML auto-injectors   Form [employer] Electronic PA Form [phone] NCPDP) Created 1 hour ago Sent to Plan 1 hour ago Plan Response 1 hour ago Submit Clinical Questions 1 hour ago Determination Favorable 15 minutes ago Message from ([phone] Your PA request has been approved. Additional information will be provided in the approval communication. (Message 1145)

## 2020-10-13 ENCOUNTER — Ambulatory Visit: Payer: BC Managed Care – PPO | Admitting: Family Medicine

## 2020-10-13 ENCOUNTER — Other Ambulatory Visit: Payer: Self-pay

## 2020-10-13 DIAGNOSIS — L209 Atopic dermatitis, unspecified: Secondary | ICD-10-CM

## 2020-10-13 MED ORDER — PREDNISONE 20 MG PO TABS: 40.0000 mg | ORAL_TABLET | Freq: Every day | ORAL | 0 refills | Status: AC

## 2020-10-13 MED ORDER — TRIAMCINOLONE ACETONIDE 0.1 % EX OINT: 1.0000 "application " | TOPICAL_OINTMENT | Freq: Two times a day (BID) | CUTANEOUS | 1 refills | Status: DC

## 2020-10-13 NOTE — Patient Instructions (Signed)
We are going to put you on both oral and topical steroids.  Please take the oral steroids every morning with breakfast.  Please also use the triamcinolone ointment on the affected area as well.  Limit showers as this can worsen the symptoms.  Continue the daily Zyrtec. Call us if you have questions or concerns.

## 2020-10-13 NOTE — Progress Notes (Signed)
    SUBJECTIVE:   CHIEF COMPLAINT / HPI:   Eczema Rash x 3 weeks. Started on extensor surface of arms and has now spread all over arms, and legs, shoulders, and back. Pruritic and describes a burning pain in the areas she is scratchings.  No fevers/chills, cough, sneezing, n/v, diarrhea. No other recent illness. No eye edema or anaphylaxis. This has never happened to this extent before. Nothing on hands/feet, palms/soles, face.  No new soaps, lotions, detergents, exposures. No outdoor activity. Lives by herself. 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  Has been taking 100 mg BID benadryl for 2 weeks with mild improvement. Also takes zyrtec once daily.   Hx of eczema, she does not use anything for it.   PERTINENT  PMH / PSH: eczema, GAD, GERD  OBJECTIVE:   BP 112/60   Pulse 80   Ht 5\' 4"  (1.626 m)   Wt 162 lb (73.5 kg)   LMP 09/19/2020   SpO2 98%   BMI 27.81 kg/m   General: well appearing, NAD Skin: Diffuse papular rash on erythematous base over flexor/extensor surfaces of legs/back/upper extremtieis. Spares face, hands, feet. Diffuse excoriations.   ASSESSMENT/PLAN:   Atopic dermatitis Given patient's history of atopic disease, and consistent physical exam, will treat as atopic dermatitis.  Since there is such a large distribution, will place patient on a 7-day course of oral steroids as well as topical triamcinolone twice daily.  Continue Zyrtec daily.  Also continue other conservative measures.  If worsening, or repeat episodes, consider referral to Derm or allergy.   09/21/2020, MD [facility]

## 2020-10-13 NOTE — Assessment & Plan Note (Signed)
Given patient's history of atopic disease, and consistent physical exam, will treat as atopic dermatitis.  Since there is such a large distribution, will place patient on a 7-day course of oral steroids as well as topical triamcinolone twice daily.  Continue Zyrtec daily.  Also continue other conservative measures.  If worsening, or repeat episodes, consider referral to Derm or allergy.

## 2020-10-16 ENCOUNTER — Telehealth: Payer: Self-pay | Admitting: Family Medicine

## 2020-10-16 DIAGNOSIS — L309 Dermatitis, unspecified: Secondary | ICD-10-CM | POA: Diagnosis not present

## 2020-10-16 NOTE — Telephone Encounter (Signed)
Patient is calling and would like to know if she could have a referral sent to an allergist. She was seen by Dr. Selena Batten 06/10. She has taken the medication but feels she will still need to see a specialist.   Patient would also like to know if Dr. Selena Batten would fill out her fmla. She said it is for the reasons she was seen for on 06-10.  I explained that the pcp usually fills out this paper work She was not wanting to scheduled another appointment since she was just here. She is having them fax the fmla paper work to our office.   Please call patient and let her know if this fmla will be able to be filled out without another office visit   The best call back is [phone].

## 2020-10-16 NOTE — Telephone Encounter (Signed)
I do not see any reason for FMLA paperwork in the office note from Dr. Selena Batten.  Atopic Dermatitis would not meet criteria.  If there is something else that was discussed that I'm unaware of, Dr. Selena Batten could possibly fill it out.  Otherwise, she would need to make an appointment to have this completed and discuss the reason.   (Sorry I sent this twice.. the new inbox is confusing me)

## 2020-10-16 NOTE — Telephone Encounter (Signed)
Called with no answer.  Instructions for patient to call back so that we can further discuss reasoning for FMLA paperwork.  Would like to know if she is not improving or if she just needs a few more days off of work.  Melene Plan, M.D.  5:20 PM 10/16/2020

## 2020-10-17 ENCOUNTER — Telehealth: Payer: Self-pay

## 2020-10-17 NOTE — Telephone Encounter (Signed)
Pt called in this morning stating she wants to discuss getting an accomodation Dr. Selena Batten so that she may work at home not around others. Please give her a call so she can further clarify and discuss options.

## 2020-10-17 NOTE — Telephone Encounter (Signed)
Patient was seen on 10/13/2020 for generalized pruritus and rash requiring oral prednisone therapy.  Patient is requesting to work from home with normal hours as an accommodation for her current medical issue. Patient would like to work from home given continued pruritus. I think this is reasonable.  She is requesting 2 items from [place].  The third-party is faxing [place] paperwork for the accommodation.  And her HR department needs a letter from the office which we can fax to [phone].  Dates for accommodation are 10/13/2020 through 10/27/2020.  Melene Plan, M.D.  3:16 PM 10/17/2020

## 2020-10-17 NOTE — Telephone Encounter (Signed)
Pt called back to give Dr. Selena Batten the fax # [phone] or e-mail address: [email or web address] to send FMLA paperwork over

## 2020-10-24 ENCOUNTER — Ambulatory Visit: Payer: 59 | Admitting: Nurse Practitioner

## 2020-10-25 NOTE — Telephone Encounter (Signed)
Call patient with no answer.  Left voicemail stating that I have not received any paperwork.  Also, patient would like to discuss extending her leave, I would recommend that she be seen in office for reevaluation as I would expect her to be improving since she was seen in the office on the 10th.   Melene Plan, M.D.  2:25 PM 10/25/2020

## 2020-10-25 NOTE — Telephone Encounter (Signed)
Patient is calling to see if Dr. Selena Batten received paper work. I let her know I did not see it in her box and it was not in the previously faxed file. She said that she would call and see if they can resend it incase Dr. Selena Batten does not have it.   Please call patient to let her know if we have received it or if it has been completed. She also asked Dr. Selena Batten to call her to discuss extending her leave. The best call back is [phone].

## 2020-10-30 ENCOUNTER — Encounter: Payer: Self-pay | Admitting: Family Medicine

## 2020-10-30 NOTE — Progress Notes (Deleted)
    SUBJECTIVE:   CHIEF COMPLAINT / HPI:   Rash follow up   FMLA paperwork reviewed.   PERTINENT  PMH / PSH: ***  OBJECTIVE:   There were no vitals taken for this visit.  ***  ASSESSMENT/PLAN:   No problem-specific Assessment & Plan notes found for this encounter.     Melene Plan, MD [facility]   {    This will disappear when note is signed, click to select method of visit    :1}

## 2020-11-01 ENCOUNTER — Ambulatory Visit: Payer: 59 | Admitting: Family Medicine

## 2020-11-01 ENCOUNTER — Telehealth: Payer: Self-pay

## 2020-11-01 NOTE — Telephone Encounter (Signed)
Patient came in today to sign signature on FMLA paperwork. 11/01/20

## 2020-11-10 NOTE — Telephone Encounter (Signed)
Spoke with Tracy Cohen and her employer FMLA representative Gave verbal response to [facility] question one that the condition is chronic and will be of at least 2 months duration

## 2020-11-13 NOTE — Telephone Encounter (Signed)
[place] with [employer] is calling and would like for Dr. Deirdre Priest to call back to discuss FMLA. They used the verbal for duration on 11/10/20 but has questions concerning the other time requested for her missing due to flare ups.   The best call back for [place] [phone]. It is okay to leave detailed message.

## 2020-11-14 NOTE — Telephone Encounter (Addendum)
Called back number given.  Is the main [employer] number  If they need to talk to me they need to call and give direct number and times that I can call to speak dirctlty wit a person   Alternatively can fax me the paperwork to addend with instructions

## 2020-11-14 NOTE — Telephone Encounter (Signed)
Representative from [employer] LVM on nurse line in regards to accomodation form. Rep requests verbal information from provider and or nurse. I called rep back, however had to LVM.   [phone]

## 2020-11-16 NOTE — Telephone Encounter (Signed)
Called patient to confirm concerns/needs. No answer.  No voicemail.  For your information.

## 2020-12-01 DIAGNOSIS — Z113 Encounter for screening for infections with a predominantly sexual mode of transmission: Secondary | ICD-10-CM | POA: Diagnosis not present

## 2020-12-01 DIAGNOSIS — Z1159 Encounter for screening for other viral diseases: Secondary | ICD-10-CM | POA: Diagnosis not present

## 2020-12-01 DIAGNOSIS — Z118 Encounter for screening for other infectious and parasitic diseases: Secondary | ICD-10-CM | POA: Diagnosis not present

## 2020-12-01 DIAGNOSIS — Z124 Encounter for screening for malignant neoplasm of cervix: Secondary | ICD-10-CM | POA: Diagnosis not present

## 2020-12-01 DIAGNOSIS — Z01419 Encounter for gynecological examination (general) (routine) without abnormal findings: Secondary | ICD-10-CM | POA: Diagnosis not present

## 2020-12-04 ENCOUNTER — Telehealth: Payer: Self-pay | Admitting: *Deleted

## 2020-12-04 NOTE — Telephone Encounter (Signed)
Patient called back and explained that she needed the two pieces of papers labeled "[employer]" sent to a separate fax number. Patient left number [phone]. I printed the copy that was scanned into the chart and refaxed form.

## 2020-12-04 NOTE — Telephone Encounter (Signed)
Patient is calling to inquire about an accommodations form allowing her to work from home.  Patient states that it was supposed to already have been received by [place] and that she has been dealing with this issue for a few weeks now.  I explained to her that the only documentation of any forms I see are her FMLA forms from [employer] and those were received by [place] on 10-25-20 and have been completed and scanned to her chart.  Patient claims that the new forms should be coming from the HR dept of [employer], accommodations department.  I checked her PCP's box and there wasn't anything in there for her.  Will check with MD to see if she has received anything about this.  Veniamin Kincaid,CMA

## 2020-12-04 NOTE — Telephone Encounter (Signed)
Received an additional call from patient and the HR rep with [employer], they are requesting all of the FMLA form be faxed.  Refaxed all pages to fax number provided.  Tracy Cohen,CMA
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
## 2020-12-11 ENCOUNTER — Ambulatory Visit: Payer: 59 | Admitting: Family Medicine

## 2021-01-05 DIAGNOSIS — Z3201 Encounter for pregnancy test, result positive: Secondary | ICD-10-CM | POA: Diagnosis not present

## 2021-01-05 DIAGNOSIS — Z640 Problems related to unwanted pregnancy: Secondary | ICD-10-CM | POA: Diagnosis not present

## 2021-01-09 DIAGNOSIS — Z640 Problems related to unwanted pregnancy: Secondary | ICD-10-CM | POA: Diagnosis not present

## 2021-01-24 ENCOUNTER — Telehealth: Payer: Self-pay | Admitting: Neurology

## 2021-01-24 NOTE — Telephone Encounter (Signed)
Patient called and left a voice mail requesting a call back from a nurse.

## 2021-01-24 NOTE — Telephone Encounter (Signed)
Patient called today and states that is no longer able to do the injections and would like to know if there is anything else that she can try for her migraines    Please call patient  [facility] [place]

## 2021-01-25 NOTE — Telephone Encounter (Signed)
1.  Magnesium oxide 400mg  daily and riboflavin 400mg  daily  2.  Tylenol as needed but should limit use to no more than 2 days out of week to prevent rebound headache.  Pt advised.

## 2021-01-25 NOTE — Telephone Encounter (Signed)
Per pt she is Preganant now and wanted to know what she could take for her migraines.  Advised of Magnesium and Riboflavin for right now.   Please advise

## 2021-01-25 NOTE — Telephone Encounter (Signed)
Pt is calling sheena back. 

## 2021-01-25 NOTE — Telephone Encounter (Signed)
Pt returning you call she left message with the after hour sevice

## 2021-01-25 NOTE — Telephone Encounter (Signed)
LMOVM for pt to call the office back.

## 2021-01-25 NOTE — Telephone Encounter (Signed)
Tried calling pt, Per pt she on a call with her insurance she will call us back.

## 2021-01-29 ENCOUNTER — Telehealth: Payer: Self-pay

## 2021-01-29 ENCOUNTER — Inpatient Hospital Stay (HOSPITAL_COMMUNITY)
Admission: AD | Admit: 2021-01-29 | Discharge: 2021-01-30 | Disposition: A | Discharge status: Home / Self Care | Payer: BC Managed Care – PPO | Attending: Obstetrics & Gynecology | Admitting: Obstetrics & Gynecology

## 2021-01-29 DIAGNOSIS — R109 Unspecified abdominal pain: Secondary | ICD-10-CM

## 2021-01-29 DIAGNOSIS — Z3A09 9 weeks gestation of pregnancy: Secondary | ICD-10-CM

## 2021-01-29 DIAGNOSIS — O26891 Other specified pregnancy related conditions, first trimester: Secondary | ICD-10-CM | POA: Diagnosis not present

## 2021-01-29 DIAGNOSIS — Z3491 Encounter for supervision of normal pregnancy, unspecified, first trimester: Secondary | ICD-10-CM

## 2021-01-29 DIAGNOSIS — Z3481 Encounter for supervision of other normal pregnancy, first trimester: Secondary | ICD-10-CM | POA: Insufficient documentation

## 2021-01-29 NOTE — Telephone Encounter (Signed)
Patient calls nurse line requesting referral for OBGYN in [place]. Patient reports that she is currently [redacted] weeks pregnant and would like to establish care with new office as soon as possible. Patient was being seen at [facility] but reports having had a bad experience.   Please advise.   Veronda Prude, RN

## 2021-01-30 ENCOUNTER — Encounter (HOSPITAL_COMMUNITY): Payer: Self-pay | Admitting: Emergency Medicine

## 2021-01-30 ENCOUNTER — Inpatient Hospital Stay (HOSPITAL_COMMUNITY): Payer: BC Managed Care – PPO

## 2021-01-30 ENCOUNTER — Other Ambulatory Visit: Payer: Self-pay

## 2021-01-30 DIAGNOSIS — Z3A09 9 weeks gestation of pregnancy: Secondary | ICD-10-CM | POA: Diagnosis not present

## 2021-01-30 DIAGNOSIS — Z3481 Encounter for supervision of other normal pregnancy, first trimester: Secondary | ICD-10-CM | POA: Diagnosis not present

## 2021-01-30 DIAGNOSIS — R109 Unspecified abdominal pain: Secondary | ICD-10-CM | POA: Diagnosis not present

## 2021-01-30 DIAGNOSIS — O26891 Other specified pregnancy related conditions, first trimester: Secondary | ICD-10-CM | POA: Diagnosis not present

## 2021-01-30 LAB — CBC
HCT: 36.9 % (ref 36.0–46.0)
Hemoglobin: 12.1 g/dL (ref 12.0–15.0)
MCH: 27.1 pg (ref 26.0–34.0)
MCHC: 32.8 g/dL (ref 30.0–36.0)
MCV: 82.7 fL (ref 80.0–100.0)
Platelets: 276 10*3/uL (ref 150–400)
RBC: 4.46 MIL/uL (ref 3.87–5.11)
RDW: 12.3 % (ref 11.5–15.5)
WBC: 10.1 10*3/uL (ref 4.0–10.5)
nRBC: 0 % (ref 0.0–0.2)

## 2021-01-30 LAB — URINALYSIS, ROUTINE W REFLEX MICROSCOPIC
Bilirubin Urine: NEGATIVE
Glucose, UA: NEGATIVE mg/dL
Hgb urine dipstick: NEGATIVE
Ketones, ur: 5 mg/dL — AB
Leukocytes,Ua: NEGATIVE
Nitrite: NEGATIVE
Protein, ur: NEGATIVE mg/dL
Specific Gravity, Urine: 1.029 (ref 1.005–1.030)
pH: 6 (ref 5.0–8.0)

## 2021-01-30 LAB — HCG, QUANTITATIVE, PREGNANCY: hCG, Beta Chain, Quant, S: 104196 m[IU]/mL — ABNORMAL HIGH (ref <5)

## 2021-01-30 LAB — ABO/RH: ABO/RH(D): O POS

## 2021-01-30 LAB — POCT PREGNANCY, URINE: Preg Test, Ur: POSITIVE — AB

## 2021-01-30 NOTE — Telephone Encounter (Signed)
Attempted to call patient to discuss but there was no answer. It looks like she was seen in the [facility]U last night and is now scheduled for prenatal care at [facility] starting 10/5. Let me know if any additional needs arise. Otherwise she should be all set.

## 2021-01-30 NOTE — MAU Provider Note (Signed)
History     CSN: [id number]  Arrival date and time: 01/29/21 2246   Event Date/Time   First Provider Initiated Contact with Patient 01/30/21 0206      Chief Complaint  Patient presents with   10 weeks preganant / Abdominal Pain    HPI Tracy Cohen is a 30 y.o. G2P0010 at [redacted]w[redacted]d who presents with abdominal pain. Symptoms started 2 months ago and worsened this weekend. Reports bilateral lower abdominal cramping that is worse on the right side. Rates pain 10/10. No aggravating factors. Hasn't treated symptoms. Denies n/v/d, fever, dysuria, vaginal bleeding, or vaginal discharge. Hasn't started prenatal care yet & was worried.   OB History     Gravida  2   Para      Term      Preterm      AB  1   Living  0      SAB      IAB  1   Ectopic      Multiple      Live Births              Past Medical History:  Diagnosis Date   Allergy    Anxiety    Depression    Migraine     Past Surgical History:  Procedure Laterality Date   ADENOIDECTOMY     TONSILLECTOMY      Family History  Problem Relation Age of Onset   High blood pressure Mother    Mental illness Father        schizophrenia   Esophageal cancer Father    Stroke Maternal Grandmother    Mental illness Maternal Grandmother        bipolar   Kidney cancer Maternal Grandmother    Cancer Maternal Grandfather    Mental illness Paternal Grandmother    Cancer Paternal Grandmother    Mental illness Paternal Grandfather    Colon cancer Neg Hx     Social History   Tobacco Use   Smoking status: Former    Types: Cigarettes   Smokeless tobacco: Never  Vaping Use   Vaping Use: Never used  Substance Use Topics   Alcohol use: Not Currently    Comment: 2-3 drinks/wk   Drug use: Not Currently    Allergies: No Known Allergies  Medications Prior to Admission  Medication Sig Dispense Refill Last Dose   Prenatal Vit-Fe Fumarate-FA (PRENATAL MULTIVITAMIN) TABS tablet Take 1 tablet by mouth daily at 12  noon.   01/29/2021   ALPRAZolam (XANAX) 0.5 MG tablet Take 1 tablet (0.5 mg total) by mouth 3 (three) times daily as needed for anxiety. 90 tablet 2    cetirizine (ZYRTEC) 5 MG tablet Take 5 mg by mouth daily. (Patient not taking: Reported on 07/04/2020)      Erenumab-aooe (AIMOVIG) 70 MG/ML SOAJ Inject 70 mg into the skin every 28 (twenty-eight) days. 3 mL 1    FLUoxetine (PROZAC) 10 MG capsule Take three capsules daily. (Patient not taking: Reported on 07/04/2020) 90 capsule 2    omeprazole (PRILOSEC) 20 MG capsule Take 1 capsule (20 mg total) by mouth as directed. Take 2 capsule (40 mg total) 2 times daily for 4 weeks then decrease to 1 capsule (20 mg total) 2 times daily for 2 weeks (Patient not taking: Reported on 07/04/2020) 140 capsule 0    Rimegepant Sulfate (NURTEC) 75 MG TBDP Take 1 tablet by mouth daily as needed (Maximum 1 tablet in 24 hours). 8 tablet 5  tinidazole (TINDAMAX) 500 MG tablet Take 500 mg by mouth 2 (two) times daily. (Patient not taking: Reported on 07/04/2020)      triamcinolone ointment (KENALOG) 0.1 % Apply 1 application topically 2 (two) times daily. 80 g 1    XULANE 150-35 MCG/24HR transdermal patch 1 (ONE) PATCH WEEKLY FOR 3 WEEKS AS DIRECTED (Patient not taking: Reported on 07/04/2020)       Review of Systems  Constitutional: Negative.   Gastrointestinal:  Positive for abdominal pain. Negative for diarrhea, nausea and vomiting.  Genitourinary: Negative.   Physical Exam   Blood pressure 117/73, pulse 68, temperature 98.7 F (37.1 C), temperature source Oral, resp. rate 20, height 5\' 4"  (1.626 m), weight 71.3 kg, last menstrual period 11/22/2020, SpO2 100 %.  Physical Exam Vitals and nursing note reviewed.  Constitutional:      Appearance: Normal appearance. She is not ill-appearing.  HENT:     Head: Normocephalic and atraumatic.  Eyes:     General: No scleral icterus. Pulmonary:     Effort: Pulmonary effort is normal. No respiratory distress.  Abdominal:      General: Abdomen is flat. There is no distension.     Palpations: Abdomen is soft.     Tenderness: There is no abdominal tenderness.  Skin:    General: Skin is warm and dry.  Neurological:     General: No focal deficit present.     Mental Status: She is alert.  Psychiatric:        Mood and Affect: Mood normal.        Behavior: Behavior normal.    MAU Course  Procedures Results for orders placed or performed during the hospital encounter of 01/29/21 (from the past 24 hour(s))  Pregnancy, urine POC     Status: Abnormal   Collection Time: 01/30/21  1:18 AM  Result Value Ref Range   Preg Test, Ur POSITIVE (A) NEGATIVE  Urinalysis, Routine w reflex microscopic Urine, Clean Catch     Status: Abnormal   Collection Time: 01/30/21  2:05 AM  Result Value Ref Range   Color, Urine YELLOW YELLOW   APPearance CLEAR CLEAR   Specific Gravity, Urine 1.029 1.005 - 1.030   pH 6.0 5.0 - 8.0   Glucose, UA NEGATIVE NEGATIVE mg/dL   Hgb urine dipstick NEGATIVE NEGATIVE   Bilirubin Urine NEGATIVE NEGATIVE   Ketones, ur 5 (A) NEGATIVE mg/dL   Protein, ur NEGATIVE NEGATIVE mg/dL   Nitrite NEGATIVE NEGATIVE   Leukocytes,Ua NEGATIVE NEGATIVE  CBC     Status: None   Collection Time: 01/30/21  2:34 AM  Result Value Ref Range   WBC 10.1 4.0 - 10.5 K/uL   RBC 4.46 3.87 - 5.11 MIL/uL   Hemoglobin 12.1 12.0 - 15.0 g/dL   HCT 02/01/21 00.9 - 38.1 %   MCV 82.7 80.0 - 100.0 fL   MCH 27.1 26.0 - 34.0 pg   MCHC 32.8 30.0 - 36.0 g/dL   RDW 82.9 93.7 - 16.9 %   Platelets 276 150 - 400 K/uL   nRBC 0.0 0.0 - 0.2 %  ABO/Rh     Status: None   Collection Time: 01/30/21  2:34 AM  Result Value Ref Range   ABO/RH(D) O POS    No rh immune globuloin      NOT A RH IMMUNE GLOBULIN CANDIDATE, PT RH POSITIVE Performed at [facility], [address] [place], [place]   hCG, quantitative, pregnancy     Status: Abnormal  Collection Time: 01/30/21  2:34 AM  Result Value Ref Range   hCG, Beta Chain,  Quant, S 104,196 (H) <5 mIU/mL   US OB Comp Less 14 Wks  Result Date: 01/30/2021 CLINICAL DATA:  Pregnant, pain EXAM: OBSTETRIC <14 WK ULTRASOUND TECHNIQUE: Transabdominal ultrasound was performed for evaluation of the gestation as well as the maternal uterus and adnexal regions. COMPARISON:  None. FINDINGS: Intrauterine gestational sac: Single Yolk sac:  Visualized. Embryo:  Visualized. Cardiac Activity: Visualized. Heart Rate: 180 bpm CRL:   25.5 mm   9 w 1 d                  [place] EDC: 09/03/2021 Subchorionic hemorrhage:  None visualized. Maternal uterus/adnexae: Right ovary is within normal limits. Left ovary is not discretely visualized. No free fluid. IMPRESSION: Single intrauterine gestation with cardiac activity, measuring 9 weeks 1 day by crown-rump length, as above. Electronically Signed   By: Charline Bills M.D.   On: 01/30/2021 03:16    MDM +UPT UA, CBC, ABO/Rh, quant hCG, and [place] today to rule out ectopic pregnancy which can be life threatening.   Ultrasound shows live IUP.  Benign abdominal exam.  Patient reassured.   Assessment and Plan   1. Abdominal pain during pregnancy in first trimester   2. Normal IUP (intrauterine pregnancy) on prenatal ultrasound, first trimester   3. [redacted] weeks gestation of pregnancy    -reviewed reasons to return to MAU -start prenatal care - given list of ob/gyns   Judeth Horn 01/30/2021, 2:06 AM

## 2021-01-30 NOTE — MAU Note (Addendum)
..  Tracy Cohen is a 30 y.o. at Unknown here in MAU reporting: Bilateral abdominal pain, that is constant and feels like stabbing. Reports vaginal bleeding sometimes when she wipes.  Had a positive home pregnancy test LMP: 11/22/2020 Onset of complaint: Beginning of August got worse today Pain score: 10/10  Lab orders placed from triage:  POCT preg

## 2021-01-30 NOTE — ED Provider Notes (Signed)
Emergency Medicine Provider OB Triage Evaluation Note  Tracy Cohen is a 30 y.o. female, G1P0010, at ~[redacted] weeks gestation who presents to the emergency department with complaints of abdominal pain. Pain is in her lower abdomen and radiates into her pelvis. Has been ongoing since August, but worsening over the past week or so. No meds PTA. Has noted some faint blood when wiping after voiding. No vaginal bleeding. Had confirmed IUP at [facility] on 01/09/21.  Review of  Systems  Positive: abdominal pain Negative: vagina bleeding  Physical Exam  BP (!) 124/91 (BP Location: Right Arm)   Pulse (!) 111   Temp 98.7 F (37.1 C) (Oral)   Resp (!) 22   Ht 5\' 4"  (1.626 m)   Wt 80 kg   LMP 11/22/2020   SpO2 100%   BMI 30.27 kg/m  General: Awake, no distress  HEENT: Atraumatic  Resp: Normal effort  Cardiac: Normal rate Abd: Nondistended, nontender  MSK: Moves all extremities without difficulty Neuro: Speech clear  Medical Decision Making  Pt evaluated for pregnancy concern and is stable for transfer to MAU. Pt is in agreement with plan for transfer.  12:30 AM Discussed with MAU APP, 11/24/2020, who accepts patient in transfer.  Clinical Impression   1. Abdominal pain during pregnancy in first trimester        Denny Peon, Antony Madura 01/30/21 0030    [place], 02/01/21, MD 01/30/21 [phone]

## 2021-01-30 NOTE — ED Notes (Signed)
Report given to MAU RN on patient's status and transfer.  

## 2021-01-30 NOTE — ED Triage Notes (Signed)
Patient is [redacted] weeks pregnant G1P0 , reports LLQ /hypogastric pain since August worse this week with occasional spotting .

## 2021-01-30 NOTE — Discharge Instructions (Signed)
  [facility] Providers          [facility]  [address], [place], [place] [address]  [phone]  [facility]  [address], [place], [place] [address]  [phone]  [facility]  [address], [place], [place] [address]  [phone]  [facility] [place]  [address], [place], [place] [address]  [phone]  [facility]  [address]First floor), [place], [place] [address]  [phone]  [facility] [address], [place], [place] [address] [phone]  [facility]  [address], [place], [place] [address]  [phone]  [facility]  [address], [place], [place] [address]  [phone]  [facility]  [address], [place], [place] [address]  [phone]  [facility]  [address], [place], [place] [address]  [phone]  [facility]  [address], [place], [place] [address]  [phone]  [facility]  [address], [place], [place] [address]  [phone]  [facility]   [address], [place], [place] [address]  [phone]  [facility]  [address], [place],  [address]   [phone]  [facility]  [address], ,  [address]  [phone]         

## 2021-02-07 ENCOUNTER — Telehealth (INDEPENDENT_AMBULATORY_CARE_PROVIDER_SITE_OTHER): Payer: BC Managed Care – PPO

## 2021-02-07 DIAGNOSIS — O219 Vomiting of pregnancy, unspecified: Secondary | ICD-10-CM

## 2021-02-07 DIAGNOSIS — G43909 Migraine, unspecified, not intractable, without status migrainosus: Secondary | ICD-10-CM

## 2021-02-07 DIAGNOSIS — O99350 Diseases of the nervous system complicating pregnancy, unspecified trimester: Secondary | ICD-10-CM

## 2021-02-07 DIAGNOSIS — Z3A00 Weeks of gestation of pregnancy not specified: Secondary | ICD-10-CM

## 2021-02-07 DIAGNOSIS — Z3490 Encounter for supervision of normal pregnancy, unspecified, unspecified trimester: Secondary | ICD-10-CM | POA: Insufficient documentation

## 2021-02-07 MED ORDER — PROMETHAZINE HCL 25 MG PO TABS
25.0000 mg | ORAL_TABLET | Freq: Four times a day (QID) | ORAL | 0 refills | Status: DC | PRN
Start: 2021-02-07 — End: 2021-07-04

## 2021-02-07 NOTE — Patient Instructions (Signed)
Behavioral Health Clinician  At our [facility] Practices, we work as an integrated team, providing care to address both physical and emotional health. Your medical provider may refer you to see our Behavioral Health Clinician [facility]) on the same day you see your medical provider, as availability permits; often scheduled virtually at your convenience.  Our [facility] is available to all patients, visits generally last between 20-30 minutes, but can be longer or shorter, depending on patient need. The [facility] offers help with stress management, coping with symptoms of depression and anxiety, major life changes , sleep issues, changing risky behavior, grief and loss, life stress, working on personal life goals, and  behavioral health issues, as these all affect your overall health and wellness.  The [facility] is NOT available for the following: FMLA paperwork, court-ordered evaluations, specialty assessments (custody or disability), letters to employers, or obtaining certification for an emotional support animal. The [facility] does not provide long-term therapy. You have the right to refuse integrated behavioral health services, or to reschedule to see the [facility] at a later date.  Confidentiality exception: If it is suspected that a child or disabled adult is being abused or neglected, we are required by law to report that to either Child Protective Services or Adult Management consultant.  If you have a diagnosis of Bipolar affective disorder, Schizophrenia, or recurrent Major depressive disorder, we will recommend that you establish care with a psychiatrist, as these are lifelong, chronic conditions, and we want your overall emotional health and medications to be more closely monitored. If you anticipate needing extended maternity leave due to mental health issues postpartum, it it recommended you inform your medical provider, so we can put in a referral to a psychiatrist as soon as possible. The [facility] is unable to recommend an extended  maternity leave for mental health issues. Your medical provider or [facility] may refer you to a therapist for ongoing, traditional therapy, or to a psychiatrist, for medication management, if it would benefit your overall health. Depending on your insurance, you may have a copay or be charged a deductible, depending on your insurance, to see the [facility]. If you are uninsured, it is recommended that you apply for financial assistance. (Forms may be requested at the front desk for in-person visits, via MyChart, or request a form during a virtual visit).  If you see the [facility] more than 6 times, you will have to complete a comprehensive clinical assessment interview with the [facility] to resume integrated services.  For virtual visits with the [facility], you must be physically in the state of [place] at the time of the visit. For example, if you live in [place], you will have to do an in-person visit with the [facility], and your out-of-state insurance may not cover behavioral health services in [place]. If you are going out of the state or country for any reason, the [facility] may see you virtually when you return to [place], but not while you are physically outside of [place].    Safe Medications in Pregnancy   Acne:  Benzoyl Peroxide  Salicylic Acid   Backache/Headache:  Tylenol: 2 regular strength every 4 hours OR               2 Extra strength every 6 hours   Colds/Coughs/Allergies:  Benadryl (alcohol free) 25 mg every 6 hours as needed  Breath right strips  Claritin  Cepacol throat lozenges  Chloraseptic throat spray  Cold-Eeze- up to three times per day  Cough drops, alcohol free  Flonase (by prescription only)  Guaifenesin  Mucinex  Robitussin DM (plain only, alcohol free)  Saline nasal spray/drops  Sudafed (pseudoephedrine) & Actifed * use only after [redacted] weeks gestation and if you do not have high blood pressure  Tylenol  Vicks Vaporub  Zinc lozenges  Zyrtec   Constipation:  Colace   Ducolax suppositories  Fleet enema  Glycerin suppositories  Metamucil  Milk of magnesia  Miralax  Senokot  Smooth move tea   Diarrhea:  Kaopectate  Imodium A-D   *NO pepto Bismol   Hemorrhoids:  Anusol  Anusol HC  Preparation H  Tucks   Indigestion:  Tums  Maalox  Mylanta  Zantac  Pepcid   Insomnia:  Benadryl (alcohol free) 25mg  every 6 hours as needed  Tylenol PM  Unisom, no Gelcaps   Leg Cramps:  Tums  MagGel   Nausea/Vomiting:  Bonine  Dramamine  Emetrol  Ginger extract  Sea bands  Meclizine  Nausea medication to take during pregnancy:  Unisom (doxylamine succinate 25 mg tablets) Take one tablet daily at bedtime. If symptoms are not adequately controlled, the dose can be increased to a maximum recommended dose of two tablets daily (1/2 tablet in the morning, 1/2 tablet mid-afternoon and one at bedtime).  Vitamin B6 100mg  tablets. Take one tablet twice a day (up to 200 mg per day).   Skin Rashes:  Aveeno products  Benadryl cream or 25mg  every 6 hours as needed  Calamine Lotion  1% cortisone cream   Yeast infection:  Gyne-lotrimin 7  Monistat 7    **If taking multiple medications, please check labels to avoid duplicating the same active ingredients  **take medication as directed on the label  ** Do not exceed 4000 mg of tylenol in 24 hours  **Do not take medications that contain aspirin or ibuprofen

## 2021-02-07 NOTE — Progress Notes (Signed)
New OB Intake  I connected with  Tracy Cohen on 02/08/21 at 10:15 AM EDT by MyChart Video Visit and verified that I am speaking with the correct person using two identifiers. Nurse is located at [facility] and pt is located at home.  I discussed the limitations, risks, security and privacy concerns of performing an evaluation and management service by telephone and the availability of in person appointments. I also discussed with the patient that there may be a patient responsible charge related to this service. The patient expressed understanding and agreed to proceed.  I explained I am completing New OB Intake today. We discussed her EDD of 08/29/20 that is based on LMP of 11/22/20. Pt is G2/P0010. I reviewed her allergies, medications, Medical/Surgical/OB history, and appropriate screenings. I informed her of [facility] services.  [facility] information placed in AVS.  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  Migraine: pt reports discontinuing all migraine medication at the beginning of July. Patient reports symptoms have been manageable. Encouraged pt to follow up with out office if this becomes a concern.  Delivery Plans:  Plans to deliver at [facility].   MyChart/Babyscripts MyChart access verified. Babyscripts instructions given and order placed.   Blood Pressure Cuff  Patient has private insurance. Instructed pt to purchase BP cuff or we can provide this at first appointment. Explained after first prenatal appt pt will check weekly and document in Babyscripts.  Anatomy US Explained first scheduled Korea will be around 19 weeks.  Anatomy US scheduled for 04/04/21.  Labs Discussed Avelina Laine genetic screening with patient. Would like both Panorama and Horizon drawn at new OB visit. Routine prenatal labs needed.  Covid Vaccine Patient has had COVID vaccine.   Mother/ Baby Dyad Candidate?    Yes, patient would like to enroll in this program. Judeth Cornfield, RN notified.  Social Determinants of Health Food Insecurity: Patient expresses food insecurity. Food Market information given to patient; explained patient may visit at the end of first OB appointment. WIC Referral: Patient is interested in referral to [facility].  Transportation: Patient denies transportation needs. Childcare: Discussed no children allowed at ultrasound appointments. Offered childcare services; patient declines childcare services at this time.  First visit review I reviewed new OB appt with pt. I explained she will have a provider visit with ob bloodwork. Encounter routed to appropriate provider. Explained that patient will be seen by pregnancy navigator following visit with provider.  Marjo Bicker, RN 02/08/2021  11:39 AM

## 2021-02-08 DIAGNOSIS — Z3490 Encounter for supervision of normal pregnancy, unspecified, unspecified trimester: Secondary | ICD-10-CM

## 2021-02-20 ENCOUNTER — Encounter: Payer: BC Managed Care – PPO | Admitting: Family Medicine

## 2021-02-27 ENCOUNTER — Ambulatory Visit (INDEPENDENT_AMBULATORY_CARE_PROVIDER_SITE_OTHER): Payer: BC Managed Care – PPO | Admitting: Family Medicine

## 2021-02-27 ENCOUNTER — Other Ambulatory Visit: Payer: Self-pay

## 2021-02-27 VITALS — BP 130/81 | HR 88 | Wt 160.0 lb

## 2021-02-27 DIAGNOSIS — Z23 Encounter for immunization: Secondary | ICD-10-CM

## 2021-02-27 DIAGNOSIS — Z3490 Encounter for supervision of normal pregnancy, unspecified, unspecified trimester: Secondary | ICD-10-CM

## 2021-02-27 DIAGNOSIS — Z3A13 13 weeks gestation of pregnancy: Secondary | ICD-10-CM

## 2021-02-27 DIAGNOSIS — Z3481 Encounter for supervision of other normal pregnancy, first trimester: Secondary | ICD-10-CM | POA: Diagnosis not present

## 2021-02-27 DIAGNOSIS — Z315 Encounter for genetic counseling: Secondary | ICD-10-CM | POA: Diagnosis not present

## 2021-02-27 MED ORDER — PRENATAL VITAMIN 27-0.8 MG PO TABS: 1.0000 | ORAL_TABLET | Freq: Every day | ORAL | 12 refills | Status: DC

## 2021-02-27 NOTE — Progress Notes (Signed)
Subjective:   Tracy Cohen is a 30 y.o. G2P0010 at [redacted]w[redacted]d by LMP, early ultrasound being seen today for her first obstetrical visit.  Her obstetrical history is significant for  n/a . Patient does intend to breast feed. Pregnancy history fully reviewed.  Patient reports no complaints.  HISTORY: OB History  Gravida Para Term Preterm AB Living  2 0 0 0 1 0  SAB IAB Ectopic Multiple Live Births  0 1 0 0 0    # Outcome Date GA Lbr Len/2nd Weight Sex Delivery Anes PTL Lv  2 Current           1 IAB 2013             Last pap smear: No results found for: DIAGPAP, HPV, HPVHIGH Reports normal at [facility], will sign ROI  Past Medical History:  Diagnosis Date   Allergy    Anxiety    Depression    Migraine    Past Surgical History:  Procedure Laterality Date   ADENOIDECTOMY     TONSILLECTOMY     Family History  Problem Relation Age of Onset   High blood pressure Mother    Mental illness Father        schizophrenia   Esophageal cancer Father    Stroke Maternal Grandmother    Mental illness Maternal Grandmother        bipolar   Kidney cancer Maternal Grandmother    Cancer Maternal Grandfather    Prostate cancer Maternal Grandfather    Colon cancer Maternal Grandfather    Mental illness Paternal Grandmother    Cancer Paternal Grandmother    Kidney cancer Paternal Grandmother    Mental illness Paternal Grandfather    Social History   Tobacco Use   Smoking status: Former    Types: Cigarettes   Smokeless tobacco: Never  Vaping Use   Vaping Use: Never used  Substance Use Topics   Alcohol use: Not Currently    Comment: 6/week (on weekend)   Drug use: Not Currently   No Known Allergies Current Outpatient Medications on File Prior to Visit  Medication Sig Dispense Refill   Prenatal Vit-Fe Fumarate-FA (PRENATAL MULTIVITAMIN) TABS tablet Take 1 tablet by mouth daily at 12 noon.     promethazine (PHENERGAN) 25 MG tablet Take 1 tablet (25 mg total) by mouth  every 6 (six) hours as needed for nausea or vomiting. 30 tablet 0   No current facility-administered medications on file prior to visit.     Exam   Vitals:   02/27/21 0847  BP: 130/81  Pulse: 88  Weight: 160 lb (72.6 kg)   Fetal Heart Rate (bpm): 160  System: General: well-developed, well-nourished female in no acute distress   Skin: normal coloration and turgor, no rashes   Neurologic: oriented, normal, negative, normal mood   Extremities: normal strength, tone, and muscle mass, ROM of all joints is normal   HEENT PERRLA, extraocular movement intact and sclera clear, anicteric   Neck supple and no masses   Respiratory:  no respiratory distress      Assessment:   Pregnancy: G2P0010 Patient Active Problem List   Diagnosis Date Noted   Supervision of low-risk pregnancy 02/07/2021   Atopic dermatitis 10/13/2020   Gastroesophageal reflux disease 07/07/2018   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 Encounter for supervision of low-risk pregnancy, antepartum Initial  labs drawn. Up to date on pap, will obtain ROI from [facility] Work letter given at her request Continue prenatal vitamins. Genetic Screening discussed, NIPS: ordered. Ultrasound discussed; fetal anatomic survey: ordered. Problem list reviewed and updated. The nature of Dyad/Family Care clinic was explained to patient; Voiced they may need to be seen by other [facility] providers which includes family medicine physicians, OB GYNs, and APPs. Delivery will hopefully be with one of the Dyad providers or another [facility] Medicine physician and we cannot promise this at this time.  Discussed there are [facility] staff in the hospital 24-7 and they understand and support this model and there is a likelihood one of these providers will catch their baby.  We also discussed that the service includes learners (residents, student) and they will  be involved in the care team.  Routine obstetric precautions reviewed. Return in 4 weeks (on 03/27/2021) for Dyad patient, ob visit.

## 2021-02-28 LAB — CBC/D/PLT+RPR+RH+ABO+RUBIGG...
Antibody Screen: NEGATIVE
Basophils Absolute: 0 10*3/uL (ref 0.0–0.2)
Basos: 0 %
EOS (ABSOLUTE): 0 10*3/uL (ref 0.0–0.4)
Eos: 0 %
HCV Ab: <0.1 s/co ratio (ref 0.0–0.9)
HIV Screen 4th Generation wRfx: NONREACTIVE
Hematocrit: 36.8 % (ref 34.0–46.6)
Hemoglobin: 12.2 g/dL (ref 11.1–15.9)
Hepatitis B Surface Ag: NEGATIVE
Immature Grans (Abs): 0 10*3/uL (ref 0.0–0.1)
Immature Granulocytes: 0 %
Lymphocytes Absolute: 2.1 10*3/uL (ref 0.7–3.1)
Lymphs: 22 %
MCH: 26.6 pg (ref 26.6–33.0)
MCHC: 33.2 g/dL (ref 31.5–35.7)
MCV: 80 fL (ref 79–97)
Monocytes Absolute: 0.6 10*3/uL (ref 0.1–0.9)
Monocytes: 6 %
Neutrophils Absolute: 6.8 10*3/uL (ref 1.4–7.0)
Neutrophils: 72 %
Platelets: 287 10*3/uL (ref 150–450)
RBC: 4.58 x10E6/uL (ref 3.77–5.28)
RDW: 11.7 % (ref 11.7–15.4)
RPR Ser Ql: NONREACTIVE
Rh Factor: POSITIVE
Rubella Antibodies, IGG: 3.67 index (ref >0.99)
WBC: 9.7 10*3/uL (ref 3.4–10.8)

## 2021-02-28 LAB — HEMOGLOBIN A1C
Est. average glucose Bld gHb Est-mCnc: 120 mg/dL
Hgb A1c MFr Bld: 5.8 % — ABNORMAL HIGH (ref 4.8–5.6)

## 2021-02-28 LAB — HCV INTERPRETATION

## 2021-03-03 LAB — CULTURE, OB URINE

## 2021-03-03 LAB — URINE CULTURE, OB REFLEX

## 2021-03-07 ENCOUNTER — Other Ambulatory Visit: Payer: BC Managed Care – PPO

## 2021-03-07 ENCOUNTER — Other Ambulatory Visit: Payer: Self-pay

## 2021-03-07 DIAGNOSIS — Z3490 Encounter for supervision of normal pregnancy, unspecified, unspecified trimester: Secondary | ICD-10-CM

## 2021-03-08 ENCOUNTER — Telehealth: Payer: Self-pay

## 2021-03-08 LAB — GLUCOSE TOLERANCE, 2 HOURS W/ 1HR
Glucose, 1 hour: 147 mg/dL (ref 70–179)
Glucose, 2 hour: 120 mg/dL (ref 70–152)
Glucose, Fasting: 79 mg/dL (ref 70–91)

## 2021-03-08 NOTE — Telephone Encounter (Signed)
Patient calls nurse line regarding slight SHOB over the last couple weeks. Reports that this is intermittent and occurs more so with activity. Denies [facility] currently.   Denies history of asthma. Reports history of smoking prior to pregnancy. Denies pain with breathing or chest pain. Denies fever, body aches, or sick symptoms. Patient is able to speak in complete sentences and does not appear to be in distress. Patient contacted OBGYN who recommended that she follow up with PCP office. Scheduled patient for follow up appointment on 11/9 and provided with ED precautions.   Veronda Prude, RN

## 2021-03-13 DIAGNOSIS — D563 Thalassemia minor: Secondary | ICD-10-CM | POA: Insufficient documentation

## 2021-03-14 ENCOUNTER — Ambulatory Visit: Payer: BC Managed Care – PPO

## 2021-03-20 ENCOUNTER — Telehealth: Payer: Self-pay | Admitting: Lactation Services

## 2021-03-20 NOTE — Telephone Encounter (Signed)
Called mom after mom expressed interest in learning more about Lactation from Brett Albino, Prenatal Navigator.   Mom reports she would like to breast feed her infant. She reports she has only heard from other friends.   Patient is active with [facility], she will meet with them on Monday, she will ask them about prenatal breast feeding classes. Gave [email or web address] website to sign up for Prenatal Breast feeding classes or childbirth classes.   Reviewed IP/OP Lactation services and how to reach Lactation.   Patient informed to check her insurance Website to see if breast pump is provided and how to order.   Reviewed that prenatal breast feeding teaching should be addressed during prenatal visits.   Patient with no specific questions at this time.  She will let staff or LC know if she has an questions or concerns in the future.

## 2021-03-21 ENCOUNTER — Ambulatory Visit (INDEPENDENT_AMBULATORY_CARE_PROVIDER_SITE_OTHER): Payer: BC Managed Care – PPO | Admitting: Family Medicine

## 2021-03-21 ENCOUNTER — Other Ambulatory Visit: Payer: Self-pay

## 2021-03-21 VITALS — BP 130/84 | HR 91 | Wt 164.2 lb

## 2021-03-21 DIAGNOSIS — R4586 Emotional lability: Secondary | ICD-10-CM

## 2021-03-21 DIAGNOSIS — D563 Thalassemia minor: Secondary | ICD-10-CM

## 2021-03-21 DIAGNOSIS — Z3490 Encounter for supervision of normal pregnancy, unspecified, unspecified trimester: Secondary | ICD-10-CM | POA: Diagnosis not present

## 2021-03-21 NOTE — Patient Instructions (Signed)
DOULA LIST   [facility] Doula  Sierra Bizzell  [phone]  [email or web address]  [email or web address]  [facility] Doula Zula Price [phone]  [email or web address]   [facility]   Precious J. Bradley   [email or web address]   ??THE MOTHERLY DOULA?? Serenna Dawson   [phone]   [email or web address]     [facility] Doul[facility]  Evelyn Tinsley  [phone]    [email or web address]   [facility]  Angie Rosier     [phone]     [email or web address]   Rachel McMillen: Doula & Photographer   Rachel McMillen [phone]       [email or web address]  [email or web address]   [facility] Matt[facility]  Amelia Mattocks [phone]   [email or web address]   [facility]  Tiffany Slade  [phone]  [email or web address]   [email or web address]   [facility]   Iris Jones   [phone]  [email or web address]   Zoei Walt  Doula  Sari Walt  [phone] [email or web address]  [facility] Doulas  Jessica Bower           Sarah Carter         Christina Flaherty       Lora Reynolds     [phone] [email or web address]  [email or web address]   [facility]   Ciara Foxx [phone] [email or web address]   [facility]  Robin StJohn     [phone]  [email or web address]  Soleil Doula  Jaden Millner     [phone]  [email or web address]  Facebook and IG [email or web address]   Bernadette Vereen  [phone] [email or web address]    Breanna Grant [phone] [email or web address]   Melissa Luck  [phone] [email or web address]     Madison Manson  [phone] [email or web address]   IG [email or web address]   Cierra Moore    [phone]  [email or web address]   Jerilyn "Jeri" Free  [phone] [email or web address]    Mtende Roll  [phone] [email or web address]   Susie Williams   [email or web address]    Liz Chavez    [phone] [email or web address]     Jessica Ayivi  [phone] [email or web address]    Zarmena Woods  [phone] [email or web address]    Shayla Rhem    [phone]   [facility] Joie Morrison  [phone] [email or web address]  Doula Mama Kathryn Farrar [phone] [email or web address]  [facility] Joie Morrison  [phone] [email or web address]  [facility]      Beth Ann Martin [phone]  [email or web address]  [email or web address]   ShawnTina Harris-Jones  [phone] [email or web address]   Sharyn Gietzen [phone] [email or web address]   Carlee Henry [phone] [email or web address]   Leatrice Priest  [phone] [email or web address]  [facility] [employer]  Terry Anderson  [phone] [email or web address]   Leslie King [phone] [email or web address]  MOOR Divine Myeka Dunn  [email or web address]   T-sheana Turner [phone] [email or web address]   Maya Jackson [phone] [email or web address]   Juante Randleman [phone] [email or web address]         

## 2021-03-21 NOTE — Progress Notes (Signed)
   PRENATAL VISIT NOTE  Subjective:  Tracy Cohen is a 30 y.o. G2P0010 at [redacted]w[redacted]d being seen today for ongoing prenatal care.  She is currently monitored for the following issues for this low-risk pregnancy and has Migraine; GAD (generalized anxiety disorder); Insomnia due to other mental disorder; Gastroesophageal reflux disease; Atopic dermatitis; Supervision of low-risk pregnancy; and Alpha thalassemia silent carrier on their problem list.  Patient reports no complaints.  Contractions: Not present. Vag. Bleeding: None.  Movement: Absent. Denies leaking of fluid.   The following portions of the patient's history were reviewed and updated as appropriate: allergies, current medications, past family history, past medical history, past social history, past surgical history and problem list.   Objective:   Vitals:   03/21/21 1557  BP: 130/84  Pulse: 91  Weight: 164 lb 3.2 oz (74.5 kg)    Fetal Status: Fetal Heart Rate (bpm): 145   Movement: Absent     General:  Alert, oriented and cooperative. Patient is in no acute distress.  Skin: Skin is warm and dry. No rash noted.   Cardiovascular: Normal heart rate noted  Respiratory: Normal respiratory effort, no problems with respiration noted  Abdomen: Soft, gravid, appropriate for gestational age.  Pain/Pressure: Present     Pelvic: Cervical exam deferred        Extremities: Normal range of motion.  Edema: None  Mental Status: Normal mood and affect. Normal behavior. Normal judgment and thought content.   Assessment and Plan:  Pregnancy: G2P0010 at [redacted]w[redacted]d 1. Encounter for supervision of low-risk pregnancy, antepartum - Many questions about labor-- answered and gave Doula List  - Discussed migraines- which are well controlled - Reviewed that NIPs/AFP/US are best reassurance and are not 100% -AFP, Serum, Open Spina Bifida  2. Alpha thalassemia silent carrier  3. Mood Altered Reports anxiety and stress at work. Elevated PHQ9 and GAD 7  today.  Referral to [facility]  Preterm labor symptoms and general obstetric precautions including but not limited to vaginal bleeding, contractions, leaking of fluid and fetal movement were reviewed in detail with the patient. Please refer to After Visit Summary for other counseling recommendations.   Return in about 4 weeks (around 04/18/2021) for Dual Care-MB Dyad, Routine prenatal care.  Future Appointments  Date Time Provider Department Center  04/04/2021 10:30 AM [facility] US3 [facility]  04/18/2021  2:15 PM MOMBABYDYAD [facility]  07/04/2021  8:30 AM Drema Dallas, DO [facility] None    Federico Flake, MD

## 2021-03-22 ENCOUNTER — Encounter: Payer: Self-pay | Admitting: Family Medicine

## 2021-03-23 LAB — AFP, SERUM, OPEN SPINA BIFIDA
AFP MoM: 1.18
AFP Value: 44.8 ng/mL
Gest. Age on Collection Date: 16.2 weeks
Maternal Age At EDD: 32.2 yr
OSBR Risk 1 IN: 10000
Test Results:: NEGATIVE
Weight: 160 [lb_av]

## 2021-03-27 ENCOUNTER — Telehealth: Payer: Self-pay

## 2021-03-27 NOTE — BH Specialist Note (Deleted)
Integrated Behavioral Health via Telemedicine Visit  03/27/2021 BRITNY RIEL [id number]  Number of Integrated Behavioral Health visits: 1 Session Start time: 8:15***  Session End time: 9:15*** Total time: {IBH Total Time:21014050}  Referring Provider: *** Patient/Family location: Home*** [facility] Provider location: [facility]  All persons participating in visit: Patient *** and [facility] Kathryne Ramella ***  Types of Service: {CHL AMB TYPE OF SERVICE:5345791997}  I connected with [name] and/or Elon Alas Schmader's {family members:20773} via  Telephone or Video Enabled Telemedicine Application  (Video is Caregility application) and verified that I am speaking with the correct person using two identifiers. Discussed confidentiality: {YES/NO:21197}  I discussed the limitations of telemedicine and the availability of in person appointments.  Discussed there is a possibility of technology failure and discussed alternative modes of communication if that failure occurs.  I discussed that engaging in this telemedicine visit, they consent to the provision of behavioral healthcare and the services will be billed under their insurance.  Patient and/or legal guardian expressed understanding and consented to Telemedicine visit: {YES/NO:21197}  Presenting Concerns: Patient and/or family reports the following symptoms/concerns: *** Duration of problem: ***; Severity of problem: {Mild/Moderate/Severe:20260}  Patient and/or Family's Strengths/Protective Factors: {CHL AMB BH PROTECTIVE FACTORS:[phone]}  Goals Addressed: Patient will:  Reduce symptoms of: {IBH Symptoms:21014056}   Increase knowledge and/or ability of: {IBH Patient Tools:21014057}   Demonstrate ability to: {IBH Goals:21014053}  Progress towards Goals: {CHL AMB BH PROGRESS TOWARDS GOALS:[phone]}  Interventions: Interventions utilized:  {IBH  Interventions:21014054} Standardized Assessments completed: {IBH Screening Tools:21014051}  Patient and/or Family Response: ***  Assessment: Patient currently experiencing ***.   Patient may benefit from ***.  Plan: Follow up with behavioral health clinician on : *** Behavioral recommendations: *** Referral(s): {IBH Referrals:21014055}  I discussed the assessment and treatment plan with the patient and/or parent/guardian. They were provided an opportunity to ask questions and all were answered. They agreed with the plan and demonstrated an understanding of the instructions.   They were advised to call back or seek an in-person evaluation if the symptoms worsen or if the condition fails to improve as anticipated.  Valetta Close Jamell Laymon, LCSW

## 2021-03-27 NOTE — Telephone Encounter (Signed)
New message   [employer] is unable to respond with clinical questions. Please see more information at the bottom of the page for next steps.  Tracy Cohen (Key: [id number]) Aimovig 70MG /ML auto-injectors   Form PA Form (2017 NCPDP) Created 1 day ago Sent to Plan 1 minute ago Plan Response 1 minute ago Submit Clinical Questions Determination Message from Plan Your PA has been resolved, no additional PA is required. For further inquiries please contact the number on the back of the member prescription card. (Message [phone])

## 2021-04-03 ENCOUNTER — Encounter: Payer: Self-pay | Admitting: Family Medicine

## 2021-04-04 ENCOUNTER — Ambulatory Visit: Payer: BC Managed Care – PPO | Attending: Family Medicine

## 2021-04-04 ENCOUNTER — Other Ambulatory Visit: Payer: Self-pay | Admitting: Family Medicine

## 2021-04-04 ENCOUNTER — Other Ambulatory Visit: Payer: Self-pay

## 2021-04-04 ENCOUNTER — Ambulatory Visit (HOSPITAL_BASED_OUTPATIENT_CLINIC_OR_DEPARTMENT_OTHER): Payer: BC Managed Care – PPO

## 2021-04-04 DIAGNOSIS — Z148 Genetic carrier of other disease: Secondary | ICD-10-CM | POA: Diagnosis not present

## 2021-04-04 DIAGNOSIS — Z3490 Encounter for supervision of normal pregnancy, unspecified, unspecified trimester: Secondary | ICD-10-CM

## 2021-04-04 DIAGNOSIS — Z363 Encounter for antenatal screening for malformations: Secondary | ICD-10-CM | POA: Diagnosis not present

## 2021-04-04 DIAGNOSIS — D563 Thalassemia minor: Secondary | ICD-10-CM | POA: Diagnosis not present

## 2021-04-04 NOTE — Progress Notes (Addendum)
Name: Tracy Cohen Indication: Silent carrier for alpha thalassemia  DOB: [birth year] Age: 30 y.o.   EDC: 08/29/2021 LMP: 11/22/2020 Referring Provider:  Venora Maples, MD  EGA: [redacted]w[redacted]d Genetic Counselor: Teena Dunk, MS, CGC  OB Hx: G2P0010 Date of Appointment: 04/04/2021  Accompanied by: Her maternal half sister Face to Face Time: 30 Minutes   Previous Testing Completed: Bo previously completed Non-Invasive Prenatal Screening (NIPS) in this pregnancy (scanned into Epic under the Media tab). The result is low risk, consistent with a female fetus. This screening significantly reduces the risk that the current pregnancy has Down syndrome, Trisomy 23, Trisomy 13, Monosomy X, and Triploidy, however, the risk is not zero given the limitations of NIPS. Additionally, there are many genetic conditions that cannot be detected by NIPS.  Tracy Cohen previously completed carrier screening (scanned into Epic under the Media tab). She screened to be a silent carrier for Alpha Thalassemia. She screened to not be a carrier for Cystic Fibrosis (CF), Spinal Muscular Atrophy (SMA), and beta hemoglobinopathies. A negative result on carrier screening reduces the likelihood of being a carrier, however, does not entirely rule out the possibility. Tracy Cohen previously completed a maternal serum AFP screen in this pregnancy. The result is screen negative. A negative result reduces the risk that the current pregnancy has an open neural tube defect.   Medical History:  Denies personal history of diabetes, high blood pressure, thyroid conditions, and seizures. Denies bleeding, infections, and fevers in this pregnancy. Denies using tobacco, alcohol, or street drugs in this pregnancy.   Family History: A pedigree was created and scanned into Epic under the Media tab. Maternal ethnicity reported as African American and paternal ethnicity reported as African American. Denies Ashkenazi Jewish ancestry. Family history not  remarkable for consanguinity, individuals with birth defects, intellectual disability, autism spectrum disorder, mental illness, multiple spontaneous abortions, still births, or unexplained neonatal death.     Genetic Counseling:   Silent Carrier for Alpha Thalassemia. Tracy Cohen is a silent carrier for alpha thalassemia (??/?-). Positive for the pathogenic alpha 3.7 deletion of the HBA2 gene. 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 Each of Tracy Cohen's children will either inherit two functional copies or one functional copy with one deletion from her. Tracy Cohen is not at an increased risk to have a baby with fetal hydrops due to Hemoglobin Barts disease (--/--) regardless of her reproductive partner's carrier status. We discussed there would be a 25% risk for the current pregnancy to be affected with Hemoglobin H disease (--/?-) if her reproductive partner is found to be an alpha thalassemia carrier in the cis configuration (??/--). Clinical features of Hemoglobin H disease are highly variable and generally develop in the first years of life. The primary symptoms include moderate anemia with marked microcytosis, jaundice, and hepatosplenomegaly. Some affected individuals do not require blood transfusions while others may require occasional blood transfusions throughout their lifetime. Because Tracy Cohen is a silent carrier for alpha thalassemia (??/?-), carrier screening for her reproductive partner is recommended to determine risk for the current pregnancy. We reviewed with Tracy Cohen that if her partner is found to be a carrier for alpha thalassemia, given his African American ancestry, it is more likely for him to be a silent carrier (??/?-) or a carrier in the trans configuration (?-/?-) as the cis configuration (??/--) has been reported very rarely in individuals with African American ancestry. If her reproductive  partner is a noncarrier (??/??), a silent carrier (??/?-), or a carrier in the trans configuration (?-/?-) there would not be an increased risk for the pregnancy to have Hemoglobin H disease. Tracy Cohen appeared to understand all of the information discussed in the genetic counseling session. She declined alpha thalassemia carrier screening for her reproductive partner at this time.    Thank you for sharing in the care of [facility] with [place].  Please do not hesitate to contact us if you have any questions.  Teena Dunk, MS, [facility]

## 2021-04-13 ENCOUNTER — Telehealth: Payer: Self-pay | Admitting: Family Medicine

## 2021-04-13 NOTE — Telephone Encounter (Signed)
Called pt. Patient reports breast swelling and tenderness. States she did a self exam of vaginal area. States it "looks bigger." Denies any pain associated with this. Denies vaginal itching, abnormal discharge or odor. Explained to pt this could be result of increased pelvic pressure and blood flow during pregnancy. Offered pelvic exam at next OB appt. Pt desires pelvic exam. Encouraged pt to monitor symptoms and notify office with any changes.

## 2021-04-13 NOTE — Telephone Encounter (Signed)
Patient called stating she is having swelling in her vagina and spoke with an after hours nurse who advised her to call us and see what to do in regards to the swelling. She is unsure what to do about the concerns.

## 2021-04-17 ENCOUNTER — Ambulatory Visit: Payer: Self-pay

## 2021-04-18 ENCOUNTER — Other Ambulatory Visit: Payer: Self-pay

## 2021-04-18 ENCOUNTER — Ambulatory Visit (INDEPENDENT_AMBULATORY_CARE_PROVIDER_SITE_OTHER): Payer: BC Managed Care – PPO | Admitting: Family Medicine

## 2021-04-18 VITALS — BP 136/82 | HR 85 | Wt 169.2 lb

## 2021-04-18 DIAGNOSIS — F411 Generalized anxiety disorder: Secondary | ICD-10-CM

## 2021-04-18 DIAGNOSIS — D563 Thalassemia minor: Secondary | ICD-10-CM

## 2021-04-18 DIAGNOSIS — Z3490 Encounter for supervision of normal pregnancy, unspecified, unspecified trimester: Secondary | ICD-10-CM

## 2021-04-18 MED ORDER — POLYETHYLENE GLYCOL 3350 17 GM/SCOOP PO POWD: 17.0000 g | Freq: Every day | ORAL | 1 refills | Status: DC | PRN

## 2021-04-18 NOTE — Progress Notes (Signed)
° °  Subjective:  Tracy Cohen is a 30 y.o. G2P0010 at [redacted]w[redacted]d being seen today for ongoing prenatal care.  She is currently monitored for the following issues for this low-risk pregnancy and has Migraine; GAD (generalized anxiety disorder); Insomnia due to other mental disorder; Gastroesophageal reflux disease; Atopic dermatitis; Supervision of low-risk pregnancy; and Alpha thalassemia silent carrier on their problem list.  Patient reports  she is worried her vulva is swollen .  Contractions: Not present. Vag. Bleeding: None.  Movement: Present. Denies leaking of fluid.   The following portions of the patient's history were reviewed and updated as appropriate: allergies, current medications, past family history, past medical history, past social history, past surgical history and problem list. Problem list updated.  Objective:   Vitals:   04/18/21 1434  BP: 136/82  Pulse: 85  Weight: 169 lb 3.2 oz (76.7 kg)    Fetal Status: Fetal Heart Rate (bpm): 156   Movement: Present     General:  Alert, oriented and cooperative. Patient is in no acute distress.  Skin: Skin is warm and dry. No rash noted.   Cardiovascular: Normal heart rate noted  Respiratory: Normal respiratory effort, no problems with respiration noted  Abdomen: Soft, gravid, appropriate for gestational age. Pain/Pressure: Present     Pelvic: Vag. Bleeding: None     Cervical exam deferred        Extremities: Normal range of motion.  Edema: None  Mental Status: Normal mood and affect. Normal behavior. Normal judgment and thought content.   Urinalysis:      Assessment and Plan:  Pregnancy: G2P0010 at [redacted]w[redacted]d  1. Encounter for supervision of low-risk pregnancy, antepartum BP and FHR normal Up to date on care External genitalia examined per patient request, no abnormalities observed. Reassured patient that she may indeed be mildly edematous but this is not uncommon in pregnancy  2. Alpha thalassemia silent carrier   3. GAD  (generalized anxiety disorder) stable  Preterm labor symptoms and general obstetric precautions including but not limited to vaginal bleeding, contractions, leaking of fluid and fetal movement were reviewed in detail with the patient. Please refer to After Visit Summary for other counseling recommendations.  Return in 4 weeks (on 05/16/2021) for Dyad patient, ob visit.   Venora Maples, MD

## 2021-04-18 NOTE — Progress Notes (Signed)
Patient in for routine prenatal visit, states that she has noticed her vaginal area is "swollen looking". Reports no pain, burning, or irritation.  Wynona Canes, CMA

## 2021-04-18 NOTE — Patient Instructions (Signed)

## 2021-05-06 NOTE — L&D Delivery Note (Signed)
OB/GYN Faculty Practice Delivery Note ? ?Tracy Cohen is a 31 y.o. G2P0010 s/p NSVD at [redacted]w[redacted]d. She was admitted for IOL for gHTN.  ? ?ROM: 3h 19m with clear fluid ?GBS Status:  Negative/-- (04/03 1204) ?Maximum Maternal Temperature: 98.6 F  ? ? ?Labor Progress: ?Patient arrived at 0.5 cm dilation and was induced with misoprostol x1. She refused foley bulb but was contracting on her own. At 2.5 cm she agreed to foley bulb, and was 5 cm when this came out. She then had AROM for clear and progressed quickly to complete over a couple of hours. Just prior to delivery FHR started to lose some variability, fetal tachycardia was becoming apparent, and there were intermittent deep variables. At that point I was considering a vacuum, however patient suddenly gave a tremendous push and delivered very quickly.   ? ?Delivery Date/Time: 08/29/2021 at 1501 ?Delivery: Called to room and patient was complete and pushing. Head delivered in OA position. Nuchal x1 present. Shoulder and body delivered in usual fashion. Infant with spontaneous cry, placed on mother's abdomen, dried and stimulated. Cord clamped x 2 after 1-minute delay, and cut by patient's mother. Cord blood drawn. Placenta delivered spontaneously with gentle cord traction. Fundus firm with massage and Pitocin. However  I then noted ongoing bleeding and found a small amount of trailing membranes which I removed. There was some ongoing trickling and I was concerned about possible cervical laceration, but after running the cervix did not find anything. With additional fundal massage bleeding stopped. Labia, perineum, vagina, and cervix inspected with bilateral labial lacerations, the patient's left side was repaired with 4-0 vicryl.  ? ?Placenta: 3v intact, to L&D ?Complications: none ?Lacerations: bilateral periuretheral, left side repaired with 4-0 vicryl on SH with buried knots at both ends ?EBL: 400 cc ?Analgesia: epidural  ? ?Infant: ?Baby Girl "Hermes" ? ?APGAR (1  MIN):  8 ?APGAR (5 MINS):  9 ? ?Weight: 3260 grams ? ?Venora Maples, MD/MPH ?Attending Family Medicine Physician, Faculty Practice ?Center for [employer], [facility] ? ?

## 2021-05-11 ENCOUNTER — Other Ambulatory Visit: Payer: Self-pay

## 2021-05-11 ENCOUNTER — Encounter (HOSPITAL_COMMUNITY): Payer: Self-pay | Admitting: Family Medicine

## 2021-05-11 ENCOUNTER — Inpatient Hospital Stay (HOSPITAL_COMMUNITY)
Admission: AD | Admit: 2021-05-11 | Discharge: 2021-05-12 | Disposition: A | Discharge status: Home / Self Care | Payer: BC Managed Care – PPO | Attending: Family Medicine | Admitting: Family Medicine

## 2021-05-11 DIAGNOSIS — Z041 Encounter for examination and observation following transport accident: Secondary | ICD-10-CM | POA: Insufficient documentation

## 2021-05-11 DIAGNOSIS — Z3A23 23 weeks gestation of pregnancy: Secondary | ICD-10-CM | POA: Insufficient documentation

## 2021-05-11 DIAGNOSIS — Y92410 Unspecified street and highway as the place of occurrence of the external cause: Secondary | ICD-10-CM | POA: Insufficient documentation

## 2021-05-11 DIAGNOSIS — O9A212 Injury, poisoning and certain other consequences of external causes complicating pregnancy, second trimester: Secondary | ICD-10-CM

## 2021-05-11 DIAGNOSIS — R109 Unspecified abdominal pain: Secondary | ICD-10-CM | POA: Insufficient documentation

## 2021-05-11 DIAGNOSIS — O26892 Other specified pregnancy related conditions, second trimester: Secondary | ICD-10-CM

## 2021-05-11 MED ORDER — ACETAMINOPHEN 500 MG PO TABS
1000.0000 mg | ORAL_TABLET | Freq: Once | ORAL | Status: AC
  Administered 2021-05-11: 1000 mg via ORAL
  Filled 2021-05-11: qty 2
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
## 2021-05-12 DIAGNOSIS — Z041 Encounter for examination and observation following transport accident: Secondary | ICD-10-CM | POA: Diagnosis not present

## 2021-05-12 DIAGNOSIS — R109 Unspecified abdominal pain: Secondary | ICD-10-CM | POA: Diagnosis not present

## 2021-05-12 DIAGNOSIS — Y92410 Unspecified street and highway as the place of occurrence of the external cause: Secondary | ICD-10-CM | POA: Diagnosis not present

## 2021-05-12 DIAGNOSIS — Z3A23 23 weeks gestation of pregnancy: Secondary | ICD-10-CM | POA: Diagnosis not present

## 2021-05-12 DIAGNOSIS — O9A212 Injury, poisoning and certain other consequences of external causes complicating pregnancy, second trimester: Secondary | ICD-10-CM | POA: Diagnosis not present

## 2021-05-12 DIAGNOSIS — O26892 Other specified pregnancy related conditions, second trimester: Secondary | ICD-10-CM | POA: Diagnosis not present

## 2021-05-21 ENCOUNTER — Ambulatory Visit (INDEPENDENT_AMBULATORY_CARE_PROVIDER_SITE_OTHER): Payer: BC Managed Care – PPO | Admitting: Family Medicine

## 2021-05-21 ENCOUNTER — Other Ambulatory Visit: Payer: Self-pay

## 2021-05-21 VITALS — BP 123/79 | HR 81 | Wt 182.4 lb

## 2021-05-21 DIAGNOSIS — Z3490 Encounter for supervision of normal pregnancy, unspecified, unspecified trimester: Secondary | ICD-10-CM

## 2021-05-21 DIAGNOSIS — Z5941 Food insecurity: Secondary | ICD-10-CM

## 2021-05-21 DIAGNOSIS — D563 Thalassemia minor: Secondary | ICD-10-CM

## 2021-05-21 NOTE — Progress Notes (Signed)
ito

## 2021-05-24 ENCOUNTER — Encounter: Payer: Self-pay | Admitting: Family Medicine

## 2021-05-24 NOTE — Progress Notes (Signed)
° °  PRENATAL VISIT NOTE  Subjective:  Tracy Cohen is a 31 y.o. G2P0010 at [redacted]w[redacted]d being seen today for ongoing prenatal care.  She is currently monitored for the following issues for this low-risk pregnancy and has Migraine; GAD (generalized anxiety disorder); Insomnia due to other mental disorder; Gastroesophageal reflux disease; Atopic dermatitis; Supervision of low-risk pregnancy; and Alpha thalassemia silent carrier on their problem list.  Patient reports no complaints.  Contractions: Not present. Vag. Bleeding: None.  Movement: Present. Denies leaking of fluid.   The following portions of the patient's history were reviewed and updated as appropriate: allergies, current medications, past family history, past medical history, past social history, past surgical history and problem list.   Objective:   Vitals:   05/21/21 1032  BP: 123/79  Pulse: 81  Weight: 182 lb 6.4 oz (82.7 kg)    Fetal Status: Fetal Heart Rate (bpm): 161   Movement: Present     General:  Alert, oriented and cooperative. Patient is in no acute distress.  Skin: Skin is warm and dry. No rash noted.   Cardiovascular: Normal heart rate noted  Respiratory: Normal respiratory effort, no problems with respiration noted  Abdomen: Soft, gravid, appropriate for gestational age.  Pain/Pressure: Absent     Pelvic: Cervical exam deferred        Extremities: Normal range of motion.  Edema: None  Mental Status: Normal mood and affect. Normal behavior. Normal judgment and thought content.   Assessment and Plan:  Pregnancy: G2P0010 at [redacted]w[redacted]d 1. Encounter for supervision of low-risk pregnancy, antepartum Up to date  2. Alpha thalassemia silent carrier Need to ask about parnter testing  3. Food insecurity - AMBULATORY REFERRAL TO [facility]  Preterm labor symptoms and general obstetric precautions including but not limited to vaginal bleeding, contractions, leaking of fluid and fetal movement were reviewed in detail  with the patient. Please refer to After Visit Summary for other counseling recommendations.   Return in about 4 weeks (around 06/18/2021) for [facility]Baby [facility], scheduled visit.  Future Appointments  Date Time Provider Department Center  06/05/2021 10:15 AM [facility]  06/20/2021 10:35 AM [facility]  07/04/2021  8:30 AM Drema Dallas, DO [facility] None  07/04/2021  2:35 PM [facility]  07/18/2021  1:55 PM [facility]    Federico Flake, MD

## 2021-06-01 ENCOUNTER — Other Ambulatory Visit: Payer: Self-pay

## 2021-06-01 ENCOUNTER — Encounter: Payer: Self-pay | Admitting: Family Medicine

## 2021-06-01 DIAGNOSIS — Z3490 Encounter for supervision of normal pregnancy, unspecified, unspecified trimester: Secondary | ICD-10-CM

## 2021-06-05 ENCOUNTER — Other Ambulatory Visit: Payer: Self-pay

## 2021-06-05 ENCOUNTER — Other Ambulatory Visit: Payer: BC Managed Care – PPO

## 2021-06-05 ENCOUNTER — Ambulatory Visit (INDEPENDENT_AMBULATORY_CARE_PROVIDER_SITE_OTHER): Payer: BC Managed Care – PPO | Admitting: Family Medicine

## 2021-06-05 VITALS — BP 127/86 | HR 89 | Wt 185.6 lb

## 2021-06-05 DIAGNOSIS — Z3490 Encounter for supervision of normal pregnancy, unspecified, unspecified trimester: Secondary | ICD-10-CM

## 2021-06-05 DIAGNOSIS — Z23 Encounter for immunization: Secondary | ICD-10-CM

## 2021-06-05 DIAGNOSIS — D563 Thalassemia minor: Secondary | ICD-10-CM

## 2021-06-05 NOTE — Patient Instructions (Signed)

## 2021-06-05 NOTE — Progress Notes (Signed)
° °  Subjective:  Tracy Cohen is a 31 y.o. G2P0010 at [redacted]w[redacted]d being seen today for ongoing prenatal care.  She is currently monitored for the following issues for this low-risk pregnancy and has Migraine; GAD (generalized anxiety disorder); Insomnia due to other mental disorder; Gastroesophageal reflux disease; Atopic dermatitis; Supervision of low-risk pregnancy; and Alpha thalassemia silent carrier on their problem list.  Patient reports no complaints.  Contractions: Not present. Vag. Bleeding: None.  Movement: Present. Denies leaking of fluid.   The following portions of the patient's history were reviewed and updated as appropriate: allergies, current medications, past family history, past medical history, past social history, past surgical history and problem list. Problem list updated.  Objective:   Vitals:   06/05/21 1059  BP: 127/86  Pulse: 89  Weight: 185 lb 9.6 oz (84.2 kg)    Fetal Status: Fetal Heart Rate (bpm): 148   Movement: Present     General:  Alert, oriented and cooperative. Patient is in no acute distress.  Skin: Skin is warm and dry. No rash noted.   Cardiovascular: Normal heart rate noted  Respiratory: Normal respiratory effort, no problems with respiration noted  Abdomen: Soft, gravid, appropriate for gestational age. Pain/Pressure: Present     Pelvic: Vag. Bleeding: None     Cervical exam deferred        Extremities: Normal range of motion.  Edema: None  Mental Status: Normal mood and affect. Normal behavior. Normal judgment and thought content.   Urinalysis:      Assessment and Plan:  Pregnancy: G2P0010 at [redacted]w[redacted]d  1. Encounter for supervision of low-risk pregnancy, antepartum BP and FHR normal Third trimester labs and TDaP today Many good questions regarding L&D process, epidurals and their placement. She is leaning towards epidural  2. Alpha thalassemia silent carrier Declines partner testing  Preterm labor symptoms and general obstetric precautions  including but not limited to vaginal bleeding, contractions, leaking of fluid and fetal movement were reviewed in detail with the patient. Please refer to After Visit Summary for other counseling recommendations.  Return in 2 weeks (on 06/19/2021) for Dyad patient, ob visit.   Clarnce Flock, MD

## 2021-06-06 LAB — CBC
Hematocrit: 32.3 % — ABNORMAL LOW (ref 34.0–46.6)
Hemoglobin: 10.9 g/dL — ABNORMAL LOW (ref 11.1–15.9)
MCH: 28.5 pg (ref 26.6–33.0)
MCHC: 33.7 g/dL (ref 31.5–35.7)
MCV: 84 fL (ref 79–97)
Platelets: 228 10*3/uL (ref 150–450)
RBC: 3.83 x10E6/uL (ref 3.77–5.28)
RDW: 12.2 % (ref 11.7–15.4)
WBC: 15.3 10*3/uL — ABNORMAL HIGH (ref 3.4–10.8)

## 2021-06-06 LAB — RPR: RPR Ser Ql: NONREACTIVE

## 2021-06-06 LAB — GLUCOSE TOLERANCE, 2 HOURS W/ 1HR
Glucose, 1 hour: 139 mg/dL (ref 70–179)
Glucose, 2 hour: 112 mg/dL (ref 70–152)
Glucose, Fasting: 77 mg/dL (ref 70–91)

## 2021-06-06 LAB — HIV ANTIBODY (ROUTINE TESTING W REFLEX): HIV Screen 4th Generation wRfx: NONREACTIVE

## 2021-06-20 ENCOUNTER — Other Ambulatory Visit: Payer: Self-pay

## 2021-06-20 ENCOUNTER — Encounter (HOSPITAL_COMMUNITY): Payer: Self-pay | Admitting: Obstetrics & Gynecology

## 2021-06-20 ENCOUNTER — Inpatient Hospital Stay (HOSPITAL_COMMUNITY)
Admission: AD | Admit: 2021-06-20 | Discharge: 2021-06-20 | Disposition: A | Discharge status: Home / Self Care | Payer: BC Managed Care – PPO | Attending: Obstetrics & Gynecology | Admitting: Obstetrics & Gynecology

## 2021-06-20 ENCOUNTER — Ambulatory Visit (INDEPENDENT_AMBULATORY_CARE_PROVIDER_SITE_OTHER): Payer: BC Managed Care – PPO | Admitting: Family Medicine

## 2021-06-20 VITALS — BP 124/73 | HR 90 | Wt 185.8 lb

## 2021-06-20 DIAGNOSIS — Z3A29 29 weeks gestation of pregnancy: Secondary | ICD-10-CM | POA: Diagnosis not present

## 2021-06-20 DIAGNOSIS — Z3493 Encounter for supervision of normal pregnancy, unspecified, third trimester: Secondary | ICD-10-CM

## 2021-06-20 DIAGNOSIS — O4692 Antepartum hemorrhage, unspecified, second trimester: Secondary | ICD-10-CM | POA: Diagnosis not present

## 2021-06-20 DIAGNOSIS — D563 Thalassemia minor: Secondary | ICD-10-CM

## 2021-06-20 DIAGNOSIS — O3680X0 Pregnancy with inconclusive fetal viability, not applicable or unspecified: Secondary | ICD-10-CM | POA: Diagnosis not present

## 2021-06-20 DIAGNOSIS — N340 Urethral abscess: Secondary | ICD-10-CM | POA: Insufficient documentation

## 2021-06-20 DIAGNOSIS — O3482 Maternal care for other abnormalities of pelvic organs, second trimester: Secondary | ICD-10-CM | POA: Diagnosis not present

## 2021-06-20 DIAGNOSIS — N368 Other specified disorders of urethra: Secondary | ICD-10-CM | POA: Diagnosis not present

## 2021-06-20 LAB — URINALYSIS, ROUTINE W REFLEX MICROSCOPIC
Bilirubin Urine: NEGATIVE
Glucose, UA: NEGATIVE mg/dL
Ketones, ur: 5 mg/dL — AB
Nitrite: NEGATIVE
Protein, ur: NEGATIVE mg/dL
Specific Gravity, Urine: 1.005 (ref 1.005–1.030)
pH: 6 (ref 5.0–8.0)

## 2021-06-20 MED ORDER — FAMOTIDINE 40 MG PO TABS: 40.0000 mg | ORAL_TABLET | Freq: Every day | ORAL | 5 refills | Status: DC

## 2021-06-20 MED ORDER — POLYETHYLENE GLYCOL 3350 17 GM/SCOOP PO POWD: 17.0000 g | Freq: Every day | ORAL | 1 refills | Status: DC | PRN

## 2021-06-20 MED ORDER — IBUPROFEN 800 MG PO TABS: 800.0000 mg | ORAL_TABLET | Freq: Three times a day (TID) | ORAL | 0 refills | Status: AC

## 2021-06-20 MED ORDER — SULFAMETHOXAZOLE-TRIMETHOPRIM 800-160 MG PO TABS: 1.0000 | ORAL_TABLET | Freq: Two times a day (BID) | ORAL | 0 refills | Status: AC

## 2021-06-20 MED ORDER — COMFORT FIT MATERNITY SUPP MED MISC: 1.0000 | 0 refills | Status: DC | PRN

## 2021-06-20 NOTE — MAU Note (Addendum)
..  Tracy Cohen is a 31 y.o. at [redacted]w[redacted]d here in MAU reporting: Pt felt her underwear was wet and she went to bathroom and saw yellow odorless fluid and bright red VB @ 2030. Pt is wear pad, and feels leaking and bleeding continued.  Pt reports no pain or cramping. Pt reports DFM since 0900, which was the last time she felt her. Pt denies complication in the pregnancy. Last intercourse was yesterday.   Onset of complaint: 2030 Pain score: 0/10 Vitals:   06/20/21 2156  BP: 131/82  Pulse: (!) 119  Resp: 18  Temp: 98.5 F (36.9 C)  SpO2: 99%     FHT:157 Lab orders placed from triage:  UA

## 2021-06-20 NOTE — Patient Instructions (Signed)

## 2021-06-20 NOTE — Discharge Instructions (Signed)
Please apply warm compresses and take antibiotics and pain medication as instructed  Can take Tylenol 1000 mg every six hours as needed for pain  Only take the Ibuprofen for 3 days as prescribed

## 2021-06-20 NOTE — Progress Notes (Signed)
° °  Subjective:  Tracy Cohen is a 31 y.o. G2P0010 at [redacted]w[redacted]d being seen today for ongoing prenatal care.  She is currently monitored for the following issues for this low-risk pregnancy and has Migraine; GAD (generalized anxiety disorder); Insomnia due to other mental disorder; Gastroesophageal reflux disease; Atopic dermatitis; Supervision of low-risk pregnancy; and Alpha thalassemia silent carrier on their problem list.  Patient reports no complaints.  Contractions: Not present. Vag. Bleeding: None.  Movement: Present. Denies leaking of fluid.   The following portions of the patient's history were reviewed and updated as appropriate: allergies, current medications, past family history, past medical history, past social history, past surgical history and problem list. Problem list updated.  Objective:   Vitals:   06/20/21 0827  BP: 124/73  Pulse: 90  Weight: 185 lb 12.8 oz (84.3 kg)    Fetal Status: Fetal Heart Rate (bpm): 145   Movement: Present     General:  Alert, oriented and cooperative. Patient is in no acute distress.  Skin: Skin is warm and dry. No rash noted.   Cardiovascular: Normal heart rate noted  Respiratory: Normal respiratory effort, no problems with respiration noted  Abdomen: Soft, gravid, appropriate for gestational age. Pain/Pressure: Present     Pelvic: Vag. Bleeding: None     Cervical exam deferred        Extremities: Normal range of motion.  Edema: None  Mental Status: Normal mood and affect. Normal behavior. Normal judgment and thought content.   Urinalysis:      Assessment and Plan:  Pregnancy: G2P0010 at [redacted]w[redacted]d  1. Encounter for supervision of low-risk pregnancy in third trimester BP and FHR normal Third trimester labs normal Maternity belt, compression socks, miralax, and pepcid sent to pharmacy for various symptoms of pregnancy  2. Alpha thalassemia silent carrier Previously declined partner testing  Preterm labor symptoms and general obstetric  precautions including but not limited to vaginal bleeding, contractions, leaking of fluid and fetal movement were reviewed in detail with the patient. Please refer to After Visit Summary for other counseling recommendations.  Return in 2 weeks (on 07/04/2021) for Dyad patient, ob visit.   Clarnce Flock, MD

## 2021-06-20 NOTE — MAU Provider Note (Signed)
Obstetric Attending MAU Note  Chief Complaint:  Vaginal Bleeding   Event Date/Time   First Provider Initiated Contact with Patient 06/20/21 2302     HPI: Tracy Cohen is a 31 y.o. G2P0010 at [redacted]w[redacted]d who presents to maternity admissions reporting vaginal bleeding and leaking yellow fluid noted around 2030 today. No pain or cramping. Worried about not feeling baby much since this morning also. Concerned that she broke her bag of water. Denies contractions.  Denies any fevers, chills, sweats, dysuria, nausea, vomiting, other GI or GU symptoms or other general symptoms.   Pregnancy Course: Receives care at [facility], she is a [facility] patient Patient Active Problem List   Diagnosis Date Noted   Urethral gland, cyst 06/20/2021   Alpha thalassemia silent carrier 03/13/2021   Supervision of low-risk pregnancy 02/07/2021   Atopic dermatitis 10/13/2020   Gastroesophageal reflux disease 07/07/2018   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   GAD (generalized anxiety disorder) 12/13/2013   Migraine 04/23/2013    Past Medical History:  Diagnosis Date   Allergy    Anxiety    Depression    Migraine     OB History  Gravida Para Term Preterm AB Living  2 0 0 0 1 0  SAB IAB Ectopic Multiple Live Births  0 1 0 0 0    # Outcome Date GA Lbr Len/2nd Weight Sex Delivery Anes PTL Lv  2 Current           1 IAB 2013            Past Surgical History:  Procedure Laterality Date   ADENOIDECTOMY     TONSILLECTOMY      Family History: Family History  Problem Relation Age of Onset   High blood pressure Mother    Mental illness Father        schizophrenia   Esophageal cancer Father    Stroke Maternal Grandmother    Mental illness Maternal Grandmother        bipolar   Kidney cancer Maternal Grandmother    Cancer Maternal Grandfather    Prostate cancer Maternal Grandfather    Colon cancer Maternal Grandfather    Mental illness Paternal Grandmother    Cancer Paternal Grandmother     Kidney cancer Paternal Grandmother    Mental illness Paternal Grandfather     Social History: Social History   Tobacco Use   Smoking status: Former    Types: Cigarettes   Smokeless tobacco: Never  Vaping Use   Vaping Use: Never used  Substance Use Topics   Alcohol use: Not Currently    Comment: 6/week (on weekend)   Drug use: Not Currently    Allergies: No Known Allergies  Medications Prior to Admission  Medication Sig Dispense Refill Last Dose   Prenatal Vit-Fe Fumarate-FA (PRENATAL VITAMIN) 27-0.8 MG TABS Take 1 tablet by mouth daily. 30 tablet 12 06/20/2021   Elastic Bandages & Supports (COMFORT FIT MATERNITY SUPP MED) MISC 1 Device by Does not apply route as needed. 1 each 0    famotidine (PEPCID) 40 MG tablet Take 1 tablet (40 mg total) by mouth daily. 30 tablet 5    polyethylene glycol powder (GLYCOLAX/MIRALAX) 17 GM/SCOOP powder Take 17 g by mouth daily as needed. 510 g 1    promethazine (PHENERGAN) 25 MG tablet Take 1 tablet (25 mg total) by mouth every 6 (six) hours as needed for nausea or vomiting. (Patient not taking: Reported on 03/21/2021) 30 tablet 0  ROS: Pertinent findings in history of present illness.  Physical Exam  Blood pressure 131/82, pulse (!) 119, temperature 98.5 F (36.9 C), temperature source Oral, resp. rate 18, height 5\' 5"  (1.651 m), weight 84.7 kg, last menstrual period 11/22/2020, SpO2 99 %. CONSTITUTIONAL: Well-developed, well-nourished female in no acute distress.  HENT:  Normocephalic, atraumatic, External right and left ear normal. Oropharynx is clear and moist EYES: Conjunctivae and EOM are normal. Pupils are equal, round, and reactive to light. No scleral icterus.  NECK: Normal range of motion, supple, no masses SKIN: Skin is warm and dry. No rash noted. Not diaphoretic. No erythema. No pallor. NEUROLGIC: Alert and oriented to person, place, and time. Normal reflexes, muscle tone coordination. No cranial nerve deficit noted. PSYCHIATRIC:  Normal mood and affect. Normal behavior. Normal judgment and thought content. CARDIOVASCULAR: Normal heart rate noted, regular rhythm RESPIRATORY: Effort and breath sounds normal, no problems with respiration noted ABDOMEN: Soft, nontender, nondistended, gravid appropriate for gestational age MUSCULOSKELETAL: Normal range of motion. No edema and no tenderness. 2+ distal pulses.  PELVIC: 2 cm enlarged Skene's gland cyst noted with spontaneous bloody and purulent drainage out of the urethra.  More drainage expressed from cyst, minimally tender to touch. Mild erythema noted.  Normal  physiologic vaginal discharge seen, no blood in vault, cervix clean and appeared closed/thick/long   FHT:  Baseline 140 bpm, moderate variability, accelerations present, no decelerations Contractions: none   Labs: Results for orders placed or performed during the hospital encounter of 06/20/21 (from the past 24 hour(s))  Urinalysis, Routine w reflex microscopic     Status: Abnormal   Collection Time: 06/20/21 10:03 PM  Result Value Ref Range   Color, Urine STRAW (A) YELLOW   APPearance CLEAR CLEAR   Specific Gravity, Urine 1.005 1.005 - 1.030   pH 6.0 5.0 - 8.0   Glucose, UA NEGATIVE NEGATIVE mg/dL   Hgb urine dipstick SMALL (A) NEGATIVE   Bilirubin Urine NEGATIVE NEGATIVE   Ketones, ur 5 (A) NEGATIVE mg/dL   Protein, ur NEGATIVE NEGATIVE mg/dL   Nitrite NEGATIVE NEGATIVE   Leukocytes,Ua LARGE (A) NEGATIVE   RBC / HPF 0-5 0 - 5 RBC/hpf   WBC, UA 21-50 0 - 5 WBC/hpf   Bacteria, UA RARE (A) NONE SEEN   Squamous Epithelial / LPF 0-5 0 - 5    Imaging:  No results found.   Assessment: 1. Urethral gland, cyst   2. [redacted] weeks gestation of pregnancy     Plan: Recommended warm compresses Prescribed Bactrim DS x 10 days, Ibuprofen 800 mg po tid x 3 days and Tylenol as needed Message sent to [facility] to schedule follow up appointment in one week. Reassuring FHR tracing, patient reassured. No signs of PTL/PPROM,  preterm labor precautions and fetal kick counts reviewed Discharged to home    Allergies as of 06/20/2021   No Known Allergies      Medication List     TAKE these medications    Comfort Fit Maternity Supp Med Misc 1 Device by Does not apply route as needed.   famotidine 40 MG tablet Commonly known as: Pepcid Take 1 tablet (40 mg total) by mouth daily.   ibuprofen 800 MG tablet Commonly known as: ADVIL Take 1 tablet (800 mg total) by mouth 3 (three) times daily with meals for 3 days.   polyethylene glycol powder 17 GM/SCOOP powder Commonly known as: GLYCOLAX/MIRALAX Take 17 g by mouth daily as needed.   Prenatal Vitamin 27-0.8 MG Tabs Take  1 tablet by mouth daily.   promethazine 25 MG tablet Commonly known as: PHENERGAN Take 1 tablet (25 mg total) by mouth every 6 (six) hours as needed for nausea or vomiting.   sulfamethoxazole-trimethoprim 800-160 MG tablet Commonly known as: BACTRIM DS Take 1 tablet by mouth 2 (two) times daily for 10 days.        Tereso Newcomer, MD 06/20/2021 11:10 PM

## 2021-06-25 ENCOUNTER — Other Ambulatory Visit: Payer: Self-pay

## 2021-06-25 ENCOUNTER — Ambulatory Visit (INDEPENDENT_AMBULATORY_CARE_PROVIDER_SITE_OTHER): Payer: BC Managed Care – PPO | Admitting: Medical

## 2021-06-25 VITALS — BP 128/79 | HR 84 | Wt 184.2 lb

## 2021-06-25 DIAGNOSIS — Z3493 Encounter for supervision of normal pregnancy, unspecified, third trimester: Secondary | ICD-10-CM | POA: Diagnosis not present

## 2021-06-25 DIAGNOSIS — O368130 Decreased fetal movements, third trimester, not applicable or unspecified: Secondary | ICD-10-CM

## 2021-06-25 DIAGNOSIS — N368 Other specified disorders of urethra: Secondary | ICD-10-CM

## 2021-06-25 DIAGNOSIS — Z3A30 30 weeks gestation of pregnancy: Secondary | ICD-10-CM

## 2021-06-25 NOTE — Progress Notes (Signed)
° °  PRENATAL VISIT NOTE  Subjective:  Tracy Cohen is a 31 y.o. G2P0010 at [redacted]w[redacted]d being seen today for an acute visit due to recent [facility] gland noted in MAU with spontaneous drainage on 06/20/21. This is not a routine prenatal visit. The patient also states that she has not felt the baby move much today or yesterday. She is also having some pain in her right side, mid-abdomen. She is currently monitored for the following issues for this low-risk pregnancy and has Migraine; GAD (generalized anxiety disorder); Insomnia due to other mental disorder; Gastroesophageal reflux disease; Atopic dermatitis; Supervision of low-risk pregnancy; Alpha thalassemia silent carrier; and Urethral gland, cyst on their problem list.  Patient reports right sided abdominal pain, decreased fetal movement.  Contractions: Irritability. Vag. Bleeding: None.  Movement: Present. Denies leaking of fluid.   The following portions of the patient's history were reviewed and updated as appropriate: allergies, current medications, past family history, past medical history, past social history, past surgical history and problem list.   Objective:   Vitals:   06/25/21 0857  BP: 128/79  Pulse: 84  Weight: 184 lb 3.2 oz (83.6 kg)    Fetal Status: Fetal Heart Rate (bpm): 158   Movement: Present     General:  Alert, oriented and cooperative. Patient is in no acute distress.  Skin: Skin is warm and dry. No rash noted.   Cardiovascular: Normal heart rate noted  Respiratory: Normal respiratory effort, no problems with respiration noted  Abdomen: Soft, gravid, appropriate for gestational age.  Pain/Pressure: Present     Pelvic: Cervical exam deferred        Skene's gland is no longer prominent or draining. No bleeding. No evidence of infection.   Extremities: Normal range of motion.  Edema: None  Mental Status: Normal mood and affect. Normal behavior. Normal judgment and thought content.   Fetal Monitoring: Baseline: 140  bpm Variability: moderate Accelerations: 10 x 10  Decelerations: none Contractions: mild UI  Assessment and Plan:  Pregnancy: G2P0010 at [redacted]w[redacted]d 1. Encounter for supervision of low-risk pregnancy in third trimester  2. Urethral gland, cyst - Resolved, no drainage, pain or concerns for infection  - Continue/Complete course of antibiotics - Continue sitz baths, warm compresses PRN   3. [redacted] weeks gestation of pregnancy  4. Decreased fetal movements in third trimester, single or unspecified fetus - Fetal nonstress test; Future  Preterm labor symptoms and general obstetric precautions including but not limited to vaginal bleeding, contractions, leaking of fluid and fetal movement were reviewed in detail with the patient. Please refer to After Visit Summary for other counseling recommendations.   Return for As scheduled.  Future Appointments  Date Time Provider Department Center  07/04/2021  8:30 AM Drema Dallas, DO [facility] None  07/04/2021  2:35 PM [facility]AD [facility]  07/18/2021  1:55 PM [facility]AD [facility]  08/01/2021  2:15 PM [facility]  08/06/2021  9:15 AM Federico Flake, MD [facility]  08/13/2021  8:35 AM Federico Flake, MD [facility]  08/20/2021  8:55 AM Federico Flake, MD [facility]  08/21/2021  1:15 PM Venora Maples, MD [facility]  08/28/2021  2:55 PM Crissie Reese Mattingly Sella, MD [facility]    Vonzella Nipple, PA-C

## 2021-06-30 ENCOUNTER — Encounter: Payer: Self-pay | Admitting: Family Medicine

## 2021-07-03 NOTE — Progress Notes (Signed)
? ?NEUROLOGY FOLLOW UP OFFICE NOTE ? ?Rachael Darby ?IC:[id number] ? ?Assessment/Plan:  ? ?Migraine without aura, without status migrainosus, not intractable ?  ?1.  May use Tylenol if needed.  Limit use of pain relievers to no more than 2 days out of week to prevent risk of rebound or medication-overuse headache. ?2.  Follow up in 4 months. ? ?Subjective:  ?Aquanette Landeck is a 31 year old female pregnant at [redacted] weeks gestation who follows up for migraines.. ?  ?UPDATE: ?Patient became pregnant.  Headaches resolved by October-November.  Due date is May 1.  As of now, she plans to breastfeeding.   ?Frequency of abortive medication: 4 days a month ?Current NSAIDS: none ?Current analgesics:  Tylenol ?Current triptans:  none ?Current ergotamine:  none ?Current anti-emetic:  none ?Current muscle relaxants:  none ?Current anti-anxiolytic:  none ?Current sleep aide:  none ?Current Antihypertensive medications:  none ?Current Antidepressant/antipsychotic none ?Current Anticonvulsant medications:  none ?Current anti-CGRP:  prenatal ?Current Vitamins/Herbal/Supplements:  none ?Current Antihistamines/Decongestants:  none ?Other therapy:  none ?Hormone/birth control:  none ?  ?Caffeine:  No ?Alcohol:  no ?Smoker:  no ?Diet:  Water, protein, vegetables ?Exercise:  Yes.  Has a trainer. ?Depression:  yes; Anxiety:  yes ?Other pain:  no ?Sleep hygiene:  ok ?  ?  ?HISTORY:  ?Onset:  Occasionally from age 83 through teenager.  Became more frequent in her mid 38s. ?Location:  Start bifrontal/temporal ?Quality:  Pounding and squeezing ?Intensity:  10/10.  She denies new headache, thunderclap headache  ?Aura:  no ?Premonitory Phase: no ?Postdrome: no ?Associated symptoms:  Dizziness, lightheadedness, nausea, vomiting, photophobia, phonophobia, osmophobia, vision loss (blurred vision to black), numbness in legs.  She denies associated unilateral weakness. ?Duration:  Usually 48 to 72 hours.  Anywhere from 1 1/2 days up to 3 months ?Frequency:   2 to 3 times a month (10 days a month) ?Frequency of abortive medication: 10 days a month ?Triggers:  Anything with red dye, sometimes caffeine, aged cheeses ?Relieving factors:  Laying down in dark and quiet room ?Activity:  Aggravates.  Misses 4 days of work a monthaimo ?  ?12/08/16 MRI of brain without contrast personally reviewed and demonstrated incidental finding of cerebellar tonsils extending 4 mm below the foramen magnum but otherwise unremarkable. ?  ?Past NSAIDS:  ibuprofen, naproxen ?Past analgesics:  Excedrin, Tylenol ?Past abortive triptans:  Maxalt, Zomig, sumatriptan tablet ?Past abortive ergotamine:  none ?Past muscle relaxants:  none ?Past anti-emetic:  Zofran, Promethazine ?Past antihypertensive medications:  none ?Past antidepressant/antipsychotic medications:  Nortriptyline, Seroquel ?Past anticonvulsant medications:  topiramate ?Past anti-CGRP:  Aimovig 70mg ; Nurtec - both d/c'd due to pregnancy ?Past vitamins/Herbal/Supplements:  none ?Past antihistamines/decongestants:  none ?Other past therapies:  none ?  ?  ?Family history of headache:  Mom (migraines later in life) ? ?PAST MEDICAL HISTORY: ?Past Medical History:  ?Diagnosis Date  ? Allergy   ? Anxiety   ? Depression   ? Migraine   ? ? ?MEDICATIONS: ?Current Outpatient Medications on File Prior to Visit  ?Medication Sig Dispense Refill  ? Elastic Bandages & Supports (COMFORT FIT MATERNITY SUPP MED) MISC 1 Device by Does not apply route as needed. 1 each 0  ? famotidine (PEPCID) 40 MG tablet Take 1 tablet (40 mg total) by mouth daily. 30 tablet 5  ? polyethylene glycol powder (GLYCOLAX/MIRALAX) 17 GM/SCOOP powder Take 17 g by mouth daily as needed. 510 g 1  ? Prenatal Vit-Fe Fumarate-FA (PRENATAL VITAMIN) 27-0.8 MG TABS Take 1  tablet by mouth daily. 30 tablet 12  ? promethazine (PHENERGAN) 25 MG tablet Take 1 tablet (25 mg total) by mouth every 6 (six) hours as needed for nausea or vomiting. (Patient not taking: Reported on 03/21/2021) 30  tablet 0  ? triamcinolone ointment (KENALOG) 0.1 % APPLY TO AFFECTED AREA TWICE A DAY (Patient not taking: Reported on 06/25/2021)    ? ?No current facility-administered medications on file prior to visit.  ? ? ?ALLERGIES: ?No Known Allergies ? ?FAMILY HISTORY: ?Family History  ?Problem Relation Age of Onset  ? High blood pressure Mother   ? Mental illness Father   ?     schizophrenia  ? Esophageal cancer Father   ? Stroke Maternal Grandmother   ? Mental illness Maternal Grandmother   ?     bipolar  ? Kidney cancer Maternal Grandmother   ? Cancer Maternal Grandfather   ? Prostate cancer Maternal Grandfather   ? Colon cancer Maternal Grandfather   ? Mental illness Paternal Grandmother   ? Cancer Paternal Grandmother   ? Kidney cancer Paternal Grandmother   ? Mental illness Paternal Grandfather   ? ? ?  ?Objective:  ?Blood pressure 121/77, pulse 96, height 5\' 5"  (1.651 m), weight 184 lb 9.6 oz (83.7 kg), last menstrual period 11/22/2020, SpO2 100 %. ?General: No acute distress.  Patient appears well-groomed.   ?Head:  Normocephalic/atraumatic ?Eyes:  Fundi examined but not visualized ?Neurological Exam: alert and oriented to person, place, and time.  Speech fluent and not dysarthric, language intact.  CN II-XII intact. Bulk and tone normal, muscle strength 5/5 throughout.  Sensation to light touch intact.  Deep tendon reflexes 2+ throughout, toes downgoing.  Finger to nose testing intact.  Gait normal, Romberg negative. ? ? ?Metta Clines, DO ? ?CC: Alcus Dad, MD ? ? ? ? ? ? ?

## 2021-07-04 ENCOUNTER — Other Ambulatory Visit: Payer: Self-pay

## 2021-07-04 ENCOUNTER — Ambulatory Visit (INDEPENDENT_AMBULATORY_CARE_PROVIDER_SITE_OTHER): Payer: BC Managed Care – PPO | Admitting: Family Medicine

## 2021-07-04 ENCOUNTER — Ambulatory Visit: Payer: 59 | Admitting: Neurology

## 2021-07-04 ENCOUNTER — Ambulatory Visit: Payer: BC Managed Care – PPO | Admitting: Neurology

## 2021-07-04 VITALS — BP 121/77 | HR 96 | Ht 65.0 in | Wt 184.6 lb

## 2021-07-04 VITALS — BP 117/74 | HR 94 | Wt 185.0 lb

## 2021-07-04 DIAGNOSIS — Z3A31 31 weeks gestation of pregnancy: Secondary | ICD-10-CM

## 2021-07-04 DIAGNOSIS — Z3493 Encounter for supervision of normal pregnancy, unspecified, third trimester: Secondary | ICD-10-CM

## 2021-07-04 DIAGNOSIS — N368 Other specified disorders of urethra: Secondary | ICD-10-CM

## 2021-07-04 DIAGNOSIS — D563 Thalassemia minor: Secondary | ICD-10-CM

## 2021-07-04 DIAGNOSIS — G43009 Migraine without aura, not intractable, without status migrainosus: Secondary | ICD-10-CM

## 2021-07-04 DIAGNOSIS — F411 Generalized anxiety disorder: Secondary | ICD-10-CM

## 2021-07-04 DIAGNOSIS — K219 Gastro-esophageal reflux disease without esophagitis: Secondary | ICD-10-CM

## 2021-07-04 NOTE — Progress Notes (Signed)
? ?  Subjective:  ?Tracy Cohen is a 31 y.o. G2P0010 at [redacted]w[redacted]d being seen today for ongoing prenatal care.  She is currently monitored for the following issues for this low-risk pregnancy and has Migraine; GAD (generalized anxiety disorder); Insomnia due to other mental disorder; Gastroesophageal reflux disease; Atopic dermatitis; Supervision of low-risk pregnancy; Alpha thalassemia silent carrier; and Urethral gland, cyst on their problem list. ? ?Patient reports no complaints.  Contractions: Not present. Vag. Bleeding: None.  Movement: Present. Denies leaking of fluid.  ? ?The following portions of the patient's history were reviewed and updated as appropriate: allergies, current medications, past family history, past medical history, past social history, past surgical history and problem list. Problem list updated. ? ?Objective:  ? ?Vitals:  ? 07/04/21 1437  ?BP: 117/74  ?Pulse: 94  ?Weight: 185 lb (83.9 kg)  ? ? ?Fetal Status: Fetal Heart Rate (bpm): 154   Movement: Present    ? ?General:  Alert, oriented and cooperative. Patient is in no acute distress.  ?Skin: Skin is warm and dry. No rash noted.   ?Cardiovascular: Normal heart rate noted  ?Respiratory: Normal respiratory effort, no problems with respiration noted  ?Abdomen: Soft, gravid, appropriate for gestational age. Pain/Pressure: Present     ?Pelvic: Vag. Bleeding: None     ?Cervical exam deferred        ?Extremities: Normal range of motion.  Edema: None  ?Mental Status: Normal mood and affect. Normal behavior. Normal judgment and thought content.  ? ?Urinalysis:     ? ?Assessment and Plan:  ?Pregnancy: G2P0010 at [redacted]w[redacted]d ? ?1. Alpha thalassemia silent carrier ?Declined partner testing ? ?2. Encounter for supervision of low-risk pregnancy in third trimester ?BP and FHR normal ?Many excellent questions around golden hour, vitamin K/erythromycin ointment/Hep B vaccines for baby after delivery, etc. ? ?3. Urethral gland, cyst ?Not addressed ? ?4. GAD  (generalized anxiety disorder) ?Anxious but coping ? ?5. Gastroesophageal reflux disease, unspecified whether esophagitis present ?Not addressed ? ?Preterm labor symptoms and general obstetric precautions including but not limited to vaginal bleeding, contractions, leaking of fluid and fetal movement were reviewed in detail with the patient. ?Please refer to After Visit Summary for other counseling recommendations.  ?Return in 2 weeks (on 07/18/2021). ? ? ?Venora Maples, MD ? ?

## 2021-07-04 NOTE — Patient Instructions (Signed)

## 2021-07-04 NOTE — Patient Instructions (Signed)
Take Tylenol if needed.  Limit use of pain relievers to no more than 2 days out of week to prevent risk of rebound or medication-overuse headache. ?Follow up 4 months. ?

## 2021-07-05 ENCOUNTER — Telehealth: Payer: Self-pay

## 2021-07-05 NOTE — Telephone Encounter (Signed)
New message   Your information has been submitted to [employer]. To check for an updated outcome later, reopen this PA request from your dashboard.  If [employer] has not responded to your request within 24 hours, contact [employer] at [phone]. If you think there may be a problem with your PA request, use our live chat feature at the bottom right.  Cherie Dark KeyPilar Plate - PA Case ID: [id number] Need help? Call us at [phone] Status Sent to [employer] Nurtec 75MG  dispersible tablets Form [employer] Electronic PA Form [phone] NCPDP)

## 2021-07-06 NOTE — Telephone Encounter (Signed)
F/u  [facility] Key: [id number] - PA Case ID: [id number] Need help? Call us at [phone] Outcome Approvedon March 2 Your PA request has been approved. Additional information will be provided in the approval communication. (Message 1145) Drug Nurtec 75MG  dispersible tablets Form [employer] Electronic PA Form (2017 NCPDP)  RE: Edwena Bunde approved your request for coverage of Nurtec 75MG  OR TBDP. Dear [facility] Theodoro ClockMarya Amsler pleased to let you know that we've approved your or your doctor's request for coverage (sometimes called a prior authorization) for Nurtec 75MG  OR TBDP. You can now fill your prescription, and it will be covered according to your plan. As long as you remain covered by your prescription drug plan and there are no changes to your plan benefits, this request is approved from 07/05/2021 to 07/05/2022. When this approval expires, please speak to your doctor about your treatment.

## 2021-07-17 ENCOUNTER — Ambulatory Visit (INDEPENDENT_AMBULATORY_CARE_PROVIDER_SITE_OTHER): Payer: BC Managed Care – PPO | Admitting: Family Medicine

## 2021-07-17 ENCOUNTER — Encounter: Payer: Self-pay | Admitting: Family Medicine

## 2021-07-17 ENCOUNTER — Other Ambulatory Visit: Payer: Self-pay

## 2021-07-17 VITALS — BP 112/80 | HR 102 | Wt 187.7 lb

## 2021-07-17 DIAGNOSIS — D563 Thalassemia minor: Secondary | ICD-10-CM

## 2021-07-17 DIAGNOSIS — F411 Generalized anxiety disorder: Secondary | ICD-10-CM

## 2021-07-17 DIAGNOSIS — N368 Other specified disorders of urethra: Secondary | ICD-10-CM

## 2021-07-17 DIAGNOSIS — Z3493 Encounter for supervision of normal pregnancy, unspecified, third trimester: Secondary | ICD-10-CM

## 2021-07-17 DIAGNOSIS — R030 Elevated blood-pressure reading, without diagnosis of hypertension: Secondary | ICD-10-CM

## 2021-07-17 NOTE — Progress Notes (Signed)
? ?  Subjective:  ?Tracy Cohen is a 31 y.o. G2P0010 at [redacted]w[redacted]d being seen today for ongoing prenatal care.  She is currently monitored for the following issues for this low-risk pregnancy and has Migraine; GAD (generalized anxiety disorder); Insomnia due to other mental disorder; Gastroesophageal reflux disease; Atopic dermatitis; Supervision of low-risk pregnancy; Alpha thalassemia silent carrier; and Urethral gland, cyst on their problem list. ? ?Patient reports no complaints.  Contractions: Not present. Vag. Bleeding: None.  Movement: Present. Denies leaking of fluid.  ? ?The following portions of the patient's history were reviewed and updated as appropriate: allergies, current medications, past family history, past medical history, past social history, past surgical history and problem list. Problem list updated. ? ?Objective:  ? ?Vitals:  ? 07/17/21 1637 07/17/21 1657  ?BP: (!) 128/91 112/80  ?Pulse: (!) 102   ?Weight: 187 lb 11.2 oz (85.1 kg)   ? ? ?Fetal Status: Fetal Heart Rate (bpm): 156 Fundal Height: 33 cm Movement: Present    ? ?General:  Alert, oriented and cooperative. Patient is in no acute distress.  ?Skin: Skin is warm and dry. No rash noted.   ?Cardiovascular: Normal heart rate noted  ?Respiratory: Normal respiratory effort, no problems with respiration noted  ?Abdomen: Soft, gravid, appropriate for gestational age. Pain/Pressure: Present     ?Pelvic: Vag. Bleeding: None     ?Cervical exam deferred        ?Extremities: Normal range of motion.  Edema: None  ?Mental Status: Normal mood and affect. Normal behavior. Normal judgment and thought content.  ? ?Urinalysis:     ? ?Assessment and Plan:  ?Pregnancy: G2P0010 at [redacted]w[redacted]d ? ?1. Encounter for supervision of low-risk pregnancy in third trimester ?BP mild range initially, see below ?FHR normal, FH normal ? ?2. GAD (generalized anxiety disorder) ?Still feeling anxious but coping well ?She has been taking child birth classes ?Many questions about labor,  epidural insertion, etc.  ? ?3. Elevated blood-pressure reading, without diagnosis of hypertension ?Initial BP barely elevated, patient with fairly high anxiety ?Repeat was very normal ?First elevated BP during this pregnancy, was here too late to get labs ?Will obtain labs if has repeat elevated BP ? ?Preterm labor symptoms and general obstetric precautions including but not limited to vaginal bleeding, contractions, leaking of fluid and fetal movement were reviewed in detail with the patient. ?Please refer to After Visit Summary for other counseling recommendations.  ?Return in about 2 weeks (around 07/31/2021) for Dyad patient, ob visit. ? ? ?Venora Maples, MD ? ?

## 2021-07-18 DIAGNOSIS — O139 Gestational [pregnancy-induced] hypertension without significant proteinuria, unspecified trimester: Secondary | ICD-10-CM | POA: Insufficient documentation

## 2021-07-18 DIAGNOSIS — R030 Elevated blood-pressure reading, without diagnosis of hypertension: Secondary | ICD-10-CM | POA: Insufficient documentation

## 2021-08-02 ENCOUNTER — Encounter: Payer: Self-pay | Admitting: Family Medicine

## 2021-08-02 ENCOUNTER — Ambulatory Visit (INDEPENDENT_AMBULATORY_CARE_PROVIDER_SITE_OTHER): Payer: BC Managed Care – PPO | Admitting: Family Medicine

## 2021-08-02 ENCOUNTER — Encounter: Payer: BC Managed Care – PPO | Admitting: Family Medicine

## 2021-08-02 VITALS — BP 138/82 | HR 90 | Wt 196.6 lb

## 2021-08-02 DIAGNOSIS — Z3493 Encounter for supervision of normal pregnancy, unspecified, third trimester: Secondary | ICD-10-CM

## 2021-08-02 DIAGNOSIS — D563 Thalassemia minor: Secondary | ICD-10-CM

## 2021-08-02 NOTE — Progress Notes (Signed)
? ?  PRENATAL VISIT NOTE ? ?Subjective:  ?Tracy Cohen is a 31 y.o. G2P0010 at [redacted]w[redacted]d being seen today for ongoing prenatal care.  She is currently monitored for the following issues for this low-risk pregnancy and has Migraine; GAD (generalized anxiety disorder); Insomnia due to other mental disorder; Gastroesophageal reflux disease; Atopic dermatitis; Supervision of low-risk pregnancy; Alpha thalassemia silent carrier; Urethral gland, cyst; and Elevated blood-pressure reading, without diagnosis of hypertension on their problem list. ? ?Patient reports  depressed mood, has some work stressors and not sleeping well .  Contractions: Not present. Vag. Bleeding: None.  Movement: Present. Denies leaking of fluid.  ? ?The following portions of the patient's history were reviewed and updated as appropriate: allergies, current medications, past family history, past medical history, past social history, past surgical history and problem list.  ? ?Objective:  ? ?Vitals:  ? 08/02/21 0847  ?BP: 138/82  ?Pulse: 90  ?Weight: 196 lb 9.6 oz (89.2 kg)  ? ? ?Fetal Status: Fetal Heart Rate (bpm): 138 Fundal Height: 35 cm Movement: Present    ? ?General:  Alert, oriented and cooperative. Patient is in no acute distress.  ?Skin: Skin is warm and dry. No rash noted.   ?Cardiovascular: Normal heart rate noted  ?Respiratory: Normal respiratory effort, no problems with respiration noted  ?Abdomen: Soft, gravid, appropriate for gestational age.  Pain/Pressure: Present     ?Pelvic: Cervical exam deferred        ?Extremities: Normal range of motion.  Edema: None  ?Mental Status: Normal mood and affect. Normal behavior. Normal judgment and thought content.  ? ?Assessment and Plan:  ?Pregnancy: G2P0010 at [redacted]w[redacted]d ?1. Alpha thalassemia silent carrier ? ? ?2. Encounter for supervision of low-risk pregnancy in third trimester ?Continue routine prenatal care. ?Cultures next visit ?Discussed medication, IBH referral, self care and mindfulness,  meditation, yoga or the like. ? ?Preterm labor symptoms and general obstetric precautions including but not limited to vaginal bleeding, contractions, leaking of fluid and fetal movement were reviewed in detail with the patient. ?Please refer to After Visit Summary for other counseling recommendations.  ? ?Return in 1 week (on 08/09/2021). ? ?Future Appointments  ?Date Time Provider Department Center  ?08/06/2021  9:15 AM Federico Flake, MD [facility]  ?08/13/2021  8:35 AM Federico Flake, MD [facility]  ?08/20/2021  8:55 AM Federico Flake, MD [facility]  ?08/28/2021  4:15 PM Crissie Reese, Pheonix Sella, MD [facility]  ?11/09/2021  8:30 AM Drema Dallas, DO [facility] None  ? ? ?Reva Bores, MD ? ?

## 2021-08-02 NOTE — Patient Instructions (Signed)

## 2021-08-03 ENCOUNTER — Telehealth (HOSPITAL_COMMUNITY): Payer: Self-pay | Admitting: Pharmacy Technician

## 2021-08-03 NOTE — Telephone Encounter (Signed)
Patient Advocate Encounter ?  ?Received notification that prior authorization for Aimovig 70MG /ML auto-injectors is required. ?  ?PA submitted on 08/03/2021 ?Key [id number] ?Status is pending ?   ? ? ? ?08/05/2021, CPhT ?Pharmacy Patient Advocate Specialist ?[facility] Patient Advocate Team ?Direct Number: [phone]  Fax: [phone]  ?

## 2021-08-04 ENCOUNTER — Inpatient Hospital Stay (HOSPITAL_COMMUNITY)
Admission: AD | Admit: 2021-08-04 | Discharge: 2021-08-05 | Disposition: A | Discharge status: Home / Self Care | Payer: BC Managed Care – PPO | Attending: Obstetrics & Gynecology | Admitting: Obstetrics & Gynecology

## 2021-08-04 DIAGNOSIS — N368 Other specified disorders of urethra: Secondary | ICD-10-CM | POA: Insufficient documentation

## 2021-08-04 DIAGNOSIS — O4693 Antepartum hemorrhage, unspecified, third trimester: Secondary | ICD-10-CM | POA: Insufficient documentation

## 2021-08-04 DIAGNOSIS — O99891 Other specified diseases and conditions complicating pregnancy: Secondary | ICD-10-CM | POA: Insufficient documentation

## 2021-08-04 DIAGNOSIS — Z3A35 35 weeks gestation of pregnancy: Secondary | ICD-10-CM | POA: Insufficient documentation

## 2021-08-05 ENCOUNTER — Encounter (HOSPITAL_COMMUNITY): Payer: Self-pay | Admitting: Obstetrics & Gynecology

## 2021-08-05 DIAGNOSIS — Z3A35 35 weeks gestation of pregnancy: Secondary | ICD-10-CM

## 2021-08-05 DIAGNOSIS — N368 Other specified disorders of urethra: Secondary | ICD-10-CM

## 2021-08-05 DIAGNOSIS — O4693 Antepartum hemorrhage, unspecified, third trimester: Secondary | ICD-10-CM | POA: Diagnosis not present

## 2021-08-05 DIAGNOSIS — O99891 Other specified diseases and conditions complicating pregnancy: Secondary | ICD-10-CM | POA: Diagnosis not present

## 2021-08-05 NOTE — MAU Note (Signed)
.  Tracy Cohen is a 31 y.o. at [redacted]w[redacted]d here in MAU reporting: vaginal spotting when wipes that started at 2300 ?LMP: 11/22/20 ?Onset of complaint: 2300 ?Pain score: 0 ?Vitals:  ? 08/05/21 0022 08/05/21 0023  ?BP:  123/86  ?Pulse:  88  ?Resp:  18  ?Temp:  98.5 ?F (36.9 ?C)  ?SpO2: 99% 100%  ?   ?FHT:143  ?Lab orders placed from triage:   ? ?

## 2021-08-05 NOTE — MAU Provider Note (Signed)
?History  ?  ? ?CSN: [id number] ? ?Arrival date and time: 08/04/21 2352 ? ? None  ?  ? ?Chief Complaint  ?Patient presents with  ? Vaginal Bleeding  ? ?HPI ?Tracy Cohen is a 31 y.o. G2P0010 at [redacted]w[redacted]d who presents to MAU for vaginal discharge. Patient reports tonight around 11pm she noticed some mucousy discharge that was streaked with blood. She denies bright red bleeding, pain, contractions, leaking fluid, itching/odor, or urinary s/s. She endorses active fetal movement. ? ?Receives prenatal care at [facility]. She is a [facility] dyad patient and is scheduled for appointment on 4/3. ? ?OB History   ? ? Gravida  ?2  ? Para  ?0  ? Term  ?0  ? Preterm  ?0  ? AB  ?1  ? Living  ?0  ?  ? ? SAB  ?0  ? IAB  ?1  ? Ectopic  ?0  ? Multiple  ?0  ? Live Births  ?0  ?   ?  ?  ? ? ?Past Medical History:  ?Diagnosis Date  ? Allergy   ? Anxiety   ? Depression   ? Migraine   ? ? ?Past Surgical History:  ?Procedure Laterality Date  ? ADENOIDECTOMY    ? TONSILLECTOMY    ? ? ?Family History  ?Problem Relation Age of Onset  ? High blood pressure Mother   ? Mental illness Father   ?     schizophrenia  ? Esophageal cancer Father   ? Stroke Maternal Grandmother   ? Mental illness Maternal Grandmother   ?     bipolar  ? Kidney cancer Maternal Grandmother   ? Cancer Maternal Grandfather   ? Prostate cancer Maternal Grandfather   ? Colon cancer Maternal Grandfather   ? Mental illness Paternal Grandmother   ? Cancer Paternal Grandmother   ? Kidney cancer Paternal Grandmother   ? Mental illness Paternal Grandfather   ? ? ?Social History  ? ?Tobacco Use  ? Smoking status: Former  ?  Types: Cigarettes  ? Smokeless tobacco: Never  ?Vaping Use  ? Vaping Use: Never used  ?Substance Use Topics  ? Alcohol use: Not Currently  ?  Comment: 6/week (on weekend)  ? Drug use: Not Currently  ? ? ?Allergies: No Known Allergies ? ?No medications prior to admission.  ? ?Review of Systems  ?Constitutional: Negative.   ?Respiratory: Negative.    ?Cardiovascular: Negative.    ?Gastrointestinal: Negative.   ?Genitourinary:  Positive for vaginal discharge.  ?Musculoskeletal: Negative.   ?Neurological: Negative.   ?Physical Exam  ? ?Blood pressure 121/76, pulse 85, temperature 98.9 ?F (37.2 ?C), temperature source Oral, resp. rate 17, height 5\' 5"  (1.651 m), weight 90.4 kg, last menstrual period 11/22/2020, SpO2 100 %. ? ?Physical Exam ?Vitals and nursing note reviewed. Exam conducted with a chaperone present.  ?Constitutional:   ?   General: She is not in acute distress. ?Eyes:  ?   Extraocular Movements: Extraocular movements intact.  ?   Pupils: Pupils are equal, round, and reactive to light.  ?Cardiovascular:  ?   Rate and Rhythm: Normal rate.  ?Pulmonary:  ?   Effort: Pulmonary effort is normal.  ?Abdominal:  ?   Palpations: Abdomen is soft.  ?   Tenderness: There is no abdominal tenderness.  ?   Comments: gravid  ?Genitourinary: ?   Comments: 1-2cm enlarged Skene's gland cyst noted with spontaneous bloody drainage.  Scant amount of purulent drainage from urethra. More drainage expressed from  cyst, minimally tender to touch.  ?Normal  physiologic vaginal discharge seen, no blood in vault, cervix clean without lesions/masses, visually closed ?Musculoskeletal:     ?   General: Normal range of motion.  ?   Cervical back: Normal range of motion.  ?Skin: ?   General: Skin is warm and dry.  ?Neurological:  ?   General: No focal deficit present.  ?   Mental Status: She is alert and oriented to person, place, and time.  ?Psychiatric:     ?   Mood and Affect: Mood normal.     ?   Behavior: Behavior normal.     ?   Thought Content: Thought content normal.     ?   Judgment: Judgment normal.  ? ?NST ?FHR: 140 bpm, moderate variability, +15x15 accels, no decels ?Toco: ui ? ?MAU Course  ?Procedures ? ?MDM ?NST reactive and reassuring with ui on toco. No blood seen in vaginal vault. Patient does have what appears to be a Skene's gland cyst. The cyst spontaneously drains blood which I suspect is the  cause of the bloody discharge patient saw tonight. This cyst is known to the patient and she reports she was recently treated with antibiotics. Given recent treatment of antibiotics, will hold at this time. Patient was instructed to use warm compresses and sitz baths. She has an OB appointment scheduled tomorrow 4/3 and will follow up at that time.  ? ?Assessment and Plan  ?[redacted] weeks gestation of pregnancy ?Skene's gland cyst ? ?- Discharge home in stable condition ?- Strict return precautions reviewed at length ?- Return to MAU sooner or as needed for worsening symptoms ?- Keep OB appointment as scheduled on 4/3 ? ? ? ?Brand Males, CNM ?08/05/2021, 2:36 AM  ?

## 2021-08-06 ENCOUNTER — Other Ambulatory Visit (HOSPITAL_COMMUNITY)
Admission: RE | Admit: 2021-08-06 | Discharge: 2021-08-06 | Disposition: A | Discharge status: Home / Self Care | Payer: BC Managed Care – PPO | Source: Ambulatory Visit | Attending: Family Medicine | Admitting: Family Medicine

## 2021-08-06 ENCOUNTER — Ambulatory Visit (INDEPENDENT_AMBULATORY_CARE_PROVIDER_SITE_OTHER): Payer: BC Managed Care – PPO | Admitting: Family Medicine

## 2021-08-06 VITALS — BP 129/84 | HR 80 | Wt 194.6 lb

## 2021-08-06 DIAGNOSIS — D563 Thalassemia minor: Secondary | ICD-10-CM

## 2021-08-06 DIAGNOSIS — N368 Other specified disorders of urethra: Secondary | ICD-10-CM

## 2021-08-06 DIAGNOSIS — Z3493 Encounter for supervision of normal pregnancy, unspecified, third trimester: Secondary | ICD-10-CM | POA: Insufficient documentation

## 2021-08-06 NOTE — Progress Notes (Signed)
? ? ?  PRENATAL VISIT NOTE ? ?Subjective:  ?Tracy Cohen is a 31 y.o. G2P0010 at [redacted]w[redacted]d being seen today for ongoing prenatal care.  She is currently monitored for the following issues for this low-risk pregnancy and has Migraine; GAD (generalized anxiety disorder); Insomnia due to other mental disorder; Gastroesophageal reflux disease; Atopic dermatitis; Supervision of low-risk pregnancy; Alpha thalassemia silent carrier; Urethral gland, cyst; and Elevated blood-pressure reading, without diagnosis of hypertension on their problem list. ? ?Patient reports no complaints.  Contractions: Not present. Vag. Bleeding: None.  Movement: Present. Denies leaking of fluid.  ? ?The following portions of the patient's history were reviewed and updated as appropriate: allergies, current medications, past family history, past medical history, past social history, past surgical history and problem list.  ? ?Objective:  ? ?Vitals:  ? 08/06/21 0902  ?BP: 129/84  ?Pulse: 80  ?Weight: 194 lb 9.6 oz (88.3 kg)  ? ? ?Fetal Status: Fetal Heart Rate (bpm): 158 Fundal Height: 37 cm Movement: Present  Presentation: Vertex ? ?General:  Alert, oriented and cooperative. Patient is in no acute distress.  ?Skin: Skin is warm and dry. No rash noted.   ?Cardiovascular: Normal heart rate noted  ?Respiratory: Normal respiratory effort, no problems with respiration noted  ?Abdomen: Soft, gravid, appropriate for gestational age.  Pain/Pressure: Present     ?Pelvic: Cervical exam deferred      collected samples with chaperone present. + urethral gland cyst without drainage and pain .  ?Extremities: Normal range of motion.  Edema: None  ?Mental Status: Normal mood and affect. Normal behavior. Normal judgment and thought content.  ? ?Assessment and Plan:  ?Pregnancy: G2P0010 at [redacted]w[redacted]d ?1. Alpha thalassemia silent carrier ?Declined partner testing ? ?2. Encounter for supervision of low-risk pregnancy in third trimester ?Discussed labor, what to bring to  hospital, installing car seat, preparing for baby at home.  ?Reviewed plan to breastmilk feed ?- GC/Chlamydia probe amp ([place])not at [facility] ?- Culture, beta strep (group b only) ? ?3. Urethral gland, cyst ?Stable, not painful on exam today, no drainage ? ?Preterm labor symptoms and general obstetric precautions including but not limited to vaginal bleeding, contractions, leaking of fluid and fetal movement were reviewed in detail with the patient. ?Please refer to After Visit Summary for other counseling recommendations.  ? ?Return in about 1 week (around 08/13/2021) for Mom+Baby Combined Care. ? ?Future Appointments  ?Date Time Provider Department Center  ?08/13/2021  8:35 AM Federico Flake, MD [facility]  ?08/20/2021  8:55 AM Federico Flake, MD [facility]  ?08/28/2021  4:15 PM Crissie Reese, Lesley Sella, MD [facility]  ?11/09/2021  8:30 AM Drema Dallas, DO [facility] None  ? ? ?Federico Flake, MD ?

## 2021-08-06 NOTE — Telephone Encounter (Signed)
Patient Advocate Encounter ? ?Prior Authorization for Aimovig 70MG /ML auto-injectors has been approved.   ? ?PA# ?Effective dates: 08/03/2021 through 08/03/2022 ? ? ? ? ? ?08/05/2022, CPhT ?Pharmacy Patient Advocate Specialist ?[facility] Patient Advocate Team ?Direct Number: [phone]  Fax: [phone]  ?

## 2021-08-07 LAB — GC/CHLAMYDIA PROBE AMP (~~LOC~~) NOT AT ARMC
Chlamydia: NEGATIVE
Comment: NEGATIVE
Comment: NORMAL
Neisseria Gonorrhea: NEGATIVE

## 2021-08-09 NOTE — Progress Notes (Signed)
? ? ?  PRENATAL VISIT NOTE ? ?I connected with Tracy Cohen 08/13/21 at  8:35 AM EDT by: MyChart video and verified that I am speaking with the correct person using two identifiers.  Patient is located at home and provider is located at [facility].   ?  ?The purpose of this virtual visit is to provide medical care while limiting exposure to the novel coronavirus. I discussed the limitations, risks, security and privacy concerns of performing an evaluation and management service by MyChart video and the availability of in person appointments. I also discussed with the patient that there may be a patient responsible charge related to this service. By engaging in this virtual visit, you consent to the provision of healthcare.  Additionally, you authorize for your insurance to be billed for the services provided during this visit.  The patient expressed understanding and agreed to proceed. ? ?The following staff members participated in the virtual visit:  Zwaye Benton ? ?Subjective:  ?Tracy Cohen is a 31 y.o. G2P0010 at [redacted]w[redacted]d being seen today for ongoing prenatal care.  She is currently monitored for the following issues for this low-risk pregnancy and has Migraine; GAD (generalized anxiety disorder); Insomnia due to other mental disorder; Gastroesophageal reflux disease; Atopic dermatitis; Supervision of low-risk pregnancy; Alpha thalassemia silent carrier; Urethral gland, cyst; and Elevated blood-pressure reading, without diagnosis of hypertension on their problem list. ? ?Patient reports no complaints.  Contractions: Irritability. Vag. Bleeding: None.  Movement: Present. Denies leaking of fluid.  ? ?The following portions of the patient's history were reviewed and updated as appropriate: allergies, current medications, past family history, past medical history, past social history, past surgical history and problem list.  ? ?Objective:  ? ?Vitals:  ? 08/13/21 0835  ?BP: 112/77  ?Pulse: 92  ? ? ?Fetal Status:      Movement: Present    ? ? ?Assessment and Plan:  ?Pregnancy: G2P0010 at [redacted]w[redacted]d ?1. Encounter for supervision of low-risk pregnancy in third trimester ?Up to date ?Reviewed travel precautions and labor precautions in detail ?Patient is going to the beach this week ? ?2. Elevated blood-pressure reading, without diagnosis of hypertension ?BP is WNL today ? ?3. GAD (generalized anxiety disorder) ?Stable, monitor postpartum ? ?4. Urethral gland, cyst ?Monitor, unable to assess today ? ?Term labor symptoms and general obstetric precautions including but not limited to vaginal bleeding, contractions, leaking of fluid and fetal movement were reviewed in detail with the patient. ?Please refer to After Visit Summary for other counseling recommendations.  ? ?Return in about 1 week (around 08/20/2021) for Routine prenatal care, Mom+Baby Combined Care. ? ?Future Appointments  ?Date Time Provider Department Center  ?08/20/2021  8:55 AM Federico Flake, MD [facility]  ?08/28/2021  4:15 PM Crissie Reese, Dellene Sella, MD [facility]  ?11/09/2021  8:30 AM Drema Dallas, DO [facility] None  ? ? ?Federico Flake, MD ?

## 2021-08-10 LAB — CULTURE, BETA STREP (GROUP B ONLY): Strep Gp B Culture: NEGATIVE

## 2021-08-13 ENCOUNTER — Telehealth (INDEPENDENT_AMBULATORY_CARE_PROVIDER_SITE_OTHER): Payer: BC Managed Care – PPO | Admitting: Family Medicine

## 2021-08-13 VITALS — BP 112/77 | HR 92

## 2021-08-13 DIAGNOSIS — Z3493 Encounter for supervision of normal pregnancy, unspecified, third trimester: Secondary | ICD-10-CM

## 2021-08-13 DIAGNOSIS — D563 Thalassemia minor: Secondary | ICD-10-CM

## 2021-08-13 DIAGNOSIS — O99343 Other mental disorders complicating pregnancy, third trimester: Secondary | ICD-10-CM

## 2021-08-13 DIAGNOSIS — N368 Other specified disorders of urethra: Secondary | ICD-10-CM

## 2021-08-13 DIAGNOSIS — O99893 Other specified diseases and conditions complicating puerperium: Secondary | ICD-10-CM

## 2021-08-13 DIAGNOSIS — F411 Generalized anxiety disorder: Secondary | ICD-10-CM

## 2021-08-13 DIAGNOSIS — R030 Elevated blood-pressure reading, without diagnosis of hypertension: Secondary | ICD-10-CM

## 2021-08-13 DIAGNOSIS — Z3A37 37 weeks gestation of pregnancy: Secondary | ICD-10-CM

## 2021-08-20 ENCOUNTER — Encounter: Payer: Self-pay | Admitting: Family Medicine

## 2021-08-20 ENCOUNTER — Ambulatory Visit (INDEPENDENT_AMBULATORY_CARE_PROVIDER_SITE_OTHER): Payer: BC Managed Care – PPO | Admitting: Family Medicine

## 2021-08-20 VITALS — BP 121/88 | HR 87 | Wt 197.0 lb

## 2021-08-20 DIAGNOSIS — F411 Generalized anxiety disorder: Secondary | ICD-10-CM

## 2021-08-20 DIAGNOSIS — D563 Thalassemia minor: Secondary | ICD-10-CM

## 2021-08-20 DIAGNOSIS — N368 Other specified disorders of urethra: Secondary | ICD-10-CM

## 2021-08-20 DIAGNOSIS — Z3493 Encounter for supervision of normal pregnancy, unspecified, third trimester: Secondary | ICD-10-CM

## 2021-08-20 DIAGNOSIS — R030 Elevated blood-pressure reading, without diagnosis of hypertension: Secondary | ICD-10-CM

## 2021-08-20 NOTE — Progress Notes (Signed)
? ? ?  PRENATAL VISIT NOTE ? ?Subjective:  ?Tracy Cohen is a 31 y.o. G2P0010 at [redacted]w[redacted]d being seen today for ongoing prenatal care.  She is currently monitored for the following issues for this low-risk pregnancy and has Migraine; GAD (generalized anxiety disorder); Insomnia due to other mental disorder; Gastroesophageal reflux disease; Atopic dermatitis; Supervision of low-risk pregnancy; Alpha thalassemia silent carrier; Urethral gland, cyst; and Elevated blood-pressure reading, without diagnosis of hypertension on their problem list. ? ?Patient reports no complaints.  Contractions: Irritability. Vag. Bleeding: None.  Movement: Present. Denies leaking of fluid.  ? ?The following portions of the patient's history were reviewed and updated as appropriate: allergies, current medications, past family history, past medical history, past social history, past surgical history and problem list.  ? ?Objective:  ? ?Vitals:  ? 08/20/21 0914  ?BP: 121/88  ?Pulse: 87  ?Weight: 197 lb (89.4 kg)  ? ? ?Fetal Status: Fetal Heart Rate (bpm): 155   Movement: Present    ? ?General:  Alert, oriented and cooperative. Patient is in no acute distress.  ?Skin: Skin is warm and dry. No rash noted.   ?Cardiovascular: Normal heart rate noted  ?Respiratory: Normal respiratory effort, no problems with respiration noted  ?Abdomen: Soft, gravid, appropriate for gestational age.  Pain/Pressure: Present     ?Pelvic: Cervical exam deferred        ?Extremities: Normal range of motion.     ?Mental Status: Normal mood and affect. Normal behavior. Normal judgment and thought content.  ? ?Assessment and Plan:  ?Pregnancy: G2P0010 at [redacted]w[redacted]d ?1. GAD (generalized anxiety disorder) ?Stable ? ?2. Elevated blood-pressure reading, without diagnosis of hypertension ?WNL today ? ?3. Encounter for supervision of low-risk pregnancy in third trimester ?Discussed IOL at 41 weeks ?Reviewed ways to start labor ?Consider sweeping next week if appropriate ? ?4. Urethral  gland, cyst ?Reduced in size ? ?Term labor symptoms and general obstetric precautions including but not limited to vaginal bleeding, contractions, leaking of fluid and fetal movement were reviewed in detail with the patient. ?Please refer to After Visit Summary for other counseling recommendations.  ? ?Return in about 1 week (around 08/27/2021) for Mom+Baby Combined Care. ? ?Future Appointments  ?Date Time Provider Department Center  ?08/28/2021  4:15 PM Crissie Reese, Foster Sella, MD [facility]  ?09/06/2021  2:15 PM Federico Flake, MD [facility]  ?11/09/2021  8:30 AM Drema Dallas, DO [facility] None  ? ? ?Federico Flake, MD ?

## 2021-08-21 ENCOUNTER — Encounter: Payer: BC Managed Care – PPO | Admitting: Family Medicine

## 2021-08-25 ENCOUNTER — Encounter: Payer: Self-pay | Admitting: Radiology

## 2021-08-26 ENCOUNTER — Telehealth: Payer: Self-pay | Admitting: Obstetrics & Gynecology

## 2021-08-26 NOTE — Telephone Encounter (Signed)
? ? ? ?  Faculty Practice OB/GYN Physician [employer] Phone Call Documentation ? ?I had a phone conversation with [employer] about elevated BP of 129/92 for Tracy Cohen who is [redacted]w[redacted]d pregnant. No HTN disorder currently, did have a documented DBP of 91 at [redacted] weeks gestation, normal since then.  I called [name] at home, and after verifying two patient identifiers, asked about this BP.  She reports that immediate recheck BP was 127/81. Patient denies any headaches, visual symptoms, RUQ/epigastric pain or other concerning symptoms. She was told to continue BP checks and let us know/go to MAU if still elevated or if she develops any symptoms.  Preeclampsia, fetal movement and labor precautions reviewed.  ? ? ? ?Jaynie Collins, MD, FACOG ?Obstetrician Heritage manager, Faculty Practice ?Center for [employer], [facility] ?

## 2021-08-28 ENCOUNTER — Inpatient Hospital Stay (HOSPITAL_COMMUNITY)
Admission: AD | Admit: 2021-08-28 | Discharge: 2021-08-30 | DRG: 807 | Disposition: A | Discharge status: Home / Self Care | Payer: BC Managed Care – PPO | Attending: Family Medicine | Admitting: Family Medicine

## 2021-08-28 ENCOUNTER — Encounter: Payer: BC Managed Care – PPO | Admitting: Family Medicine

## 2021-08-28 ENCOUNTER — Encounter (HOSPITAL_COMMUNITY): Payer: Self-pay | Admitting: Obstetrics and Gynecology

## 2021-08-28 ENCOUNTER — Ambulatory Visit (INDEPENDENT_AMBULATORY_CARE_PROVIDER_SITE_OTHER): Payer: BC Managed Care – PPO | Admitting: Family Medicine

## 2021-08-28 ENCOUNTER — Encounter: Payer: Self-pay | Admitting: Family Medicine

## 2021-08-28 VITALS — BP 138/91 | HR 91 | Wt 198.7 lb

## 2021-08-28 DIAGNOSIS — D649 Anemia, unspecified: Secondary | ICD-10-CM | POA: Diagnosis not present

## 2021-08-28 DIAGNOSIS — N368 Other specified disorders of urethra: Secondary | ICD-10-CM

## 2021-08-28 DIAGNOSIS — O139 Gestational [pregnancy-induced] hypertension without significant proteinuria, unspecified trimester: Secondary | ICD-10-CM

## 2021-08-28 DIAGNOSIS — O8612 Endometritis following delivery: Secondary | ICD-10-CM | POA: Diagnosis not present

## 2021-08-28 DIAGNOSIS — O133 Gestational [pregnancy-induced] hypertension without significant proteinuria, third trimester: Secondary | ICD-10-CM

## 2021-08-28 DIAGNOSIS — Z87891 Personal history of nicotine dependence: Secondary | ICD-10-CM | POA: Diagnosis not present

## 2021-08-28 DIAGNOSIS — Z3493 Encounter for supervision of normal pregnancy, unspecified, third trimester: Secondary | ICD-10-CM

## 2021-08-28 DIAGNOSIS — O134 Gestational [pregnancy-induced] hypertension without significant proteinuria, complicating childbirth: Principal | ICD-10-CM | POA: Diagnosis present

## 2021-08-28 DIAGNOSIS — O164 Unspecified maternal hypertension, complicating childbirth: Secondary | ICD-10-CM | POA: Diagnosis not present

## 2021-08-28 DIAGNOSIS — D563 Thalassemia minor: Secondary | ICD-10-CM | POA: Diagnosis not present

## 2021-08-28 DIAGNOSIS — Z3A39 39 weeks gestation of pregnancy: Secondary | ICD-10-CM

## 2021-08-28 DIAGNOSIS — Z3490 Encounter for supervision of normal pregnancy, unspecified, unspecified trimester: Secondary | ICD-10-CM

## 2021-08-28 DIAGNOSIS — D509 Iron deficiency anemia, unspecified: Secondary | ICD-10-CM | POA: Diagnosis not present

## 2021-08-28 DIAGNOSIS — O9902 Anemia complicating childbirth: Secondary | ICD-10-CM | POA: Diagnosis not present

## 2021-08-28 LAB — CBC
HCT: 35.3 % — ABNORMAL LOW (ref 36.0–46.0)
Hemoglobin: 11.4 g/dL — ABNORMAL LOW (ref 12.0–15.0)
MCH: 27.3 pg (ref 26.0–34.0)
MCHC: 32.3 g/dL (ref 30.0–36.0)
MCV: 84.4 fL (ref 80.0–100.0)
Platelets: 247 10*3/uL (ref 150–400)
RBC: 4.18 MIL/uL (ref 3.87–5.11)
RDW: 14 % (ref 11.5–15.5)
WBC: 14 10*3/uL — ABNORMAL HIGH (ref 4.0–10.5)
nRBC: 0 % (ref 0.0–0.2)

## 2021-08-28 LAB — PROTEIN / CREATININE RATIO, URINE
Creatinine, Urine: 37.28 mg/dL
Total Protein, Urine: <6 mg/dL

## 2021-08-28 MED ORDER — OXYTOCIN BOLUS FROM INFUSION
333.0000 mL | Freq: Once | INTRAVENOUS | Status: AC
  Administered 2021-08-29: 333 mL via INTRAVENOUS

## 2021-08-28 MED ORDER — LIDOCAINE HCL (PF) 1 % IJ SOLN
30.0000 mL | INTRAMUSCULAR | Status: AC | PRN
  Administered 2021-08-29: 30 mL via SUBCUTANEOUS
  Filled 2021-08-28: qty 30

## 2021-08-28 MED ORDER — LACTATED RINGERS IV SOLN: INTRAVENOUS | Status: DC

## 2021-08-28 MED ORDER — ACETAMINOPHEN 325 MG PO TABS
650.0000 mg | ORAL_TABLET | ORAL | Status: DC | PRN
  Administered 2021-08-29: 650 mg via ORAL
  Filled 2021-08-28: qty 2

## 2021-08-28 MED ORDER — SOD CITRATE-CITRIC ACID 500-334 MG/5ML PO SOLN: 30.0000 mL | ORAL | Status: DC | PRN

## 2021-08-28 MED ORDER — OXYCODONE-ACETAMINOPHEN 5-325 MG PO TABS: 1.0000 | ORAL_TABLET | ORAL | Status: DC | PRN

## 2021-08-28 MED ORDER — MISOPROSTOL 50MCG HALF TABLET
50.0000 ug | ORAL_TABLET | ORAL | Status: DC | PRN
  Administered 2021-08-28: 50 ug via BUCCAL
  Filled 2021-08-28: qty 1

## 2021-08-28 MED ORDER — MISOPROSTOL 25 MCG QUARTER TABLET: 25.0000 ug | ORAL_TABLET | ORAL | Status: DC | PRN

## 2021-08-28 MED ORDER — OXYCODONE-ACETAMINOPHEN 5-325 MG PO TABS: 2.0000 | ORAL_TABLET | ORAL | Status: DC | PRN

## 2021-08-28 MED ORDER — LACTATED RINGERS IV SOLN: 500.0000 mL | INTRAVENOUS | Status: DC | PRN

## 2021-08-28 MED ORDER — ONDANSETRON HCL 4 MG/2ML IJ SOLN
4.0000 mg | Freq: Four times a day (QID) | INTRAMUSCULAR | Status: DC | PRN
  Administered 2021-08-29: 4 mg via INTRAVENOUS
  Filled 2021-08-28: qty 2

## 2021-08-28 MED ORDER — TERBUTALINE SULFATE 1 MG/ML IJ SOLN: 0.2500 mg | Freq: Once | INTRAMUSCULAR | Status: DC | PRN

## 2021-08-28 MED ORDER — OXYTOCIN-SODIUM CHLORIDE 30-0.9 UT/500ML-% IV SOLN
2.5000 [IU]/h | INTRAVENOUS | Status: DC
  Filled 2021-08-28: qty 500

## 2021-08-28 NOTE — Progress Notes (Signed)
? ?  Subjective:  ?Tracy Cohen is a 31 y.o. G2P0010 at [redacted]w[redacted]d being seen today for ongoing prenatal care.  She is currently monitored for the following issues for this low-risk pregnancy and has Migraine; GAD (generalized anxiety disorder); Insomnia due to other mental disorder; Gastroesophageal reflux disease; Atopic dermatitis; Supervision of low-risk pregnancy; Alpha thalassemia silent carrier; Urethral gland, cyst; and Elevated blood-pressure reading, without diagnosis of hypertension on their problem list. ? ?Patient reports no complaints.  Contractions: Irritability. Vag. Bleeding: None.  Movement: Present. Denies leaking of fluid.  ? ?The following portions of the patient's history were reviewed and updated as appropriate: allergies, current medications, past family history, past medical history, past social history, past surgical history and problem list. Problem list updated. ? ?Objective:  ? ?Vitals:  ? 08/28/21 1627  ?BP: (!) 138/91  ?Pulse: 91  ?Weight: 198 lb 11.2 oz (90.1 kg)  ? ? ?Fetal Status: Fetal Heart Rate (bpm): 172   Movement: Present    ? ?General:  Alert, oriented and cooperative. Patient is in no acute distress.  ?Skin: Skin is warm and dry. No rash noted.   ?Cardiovascular: Normal heart rate noted  ?Respiratory: Normal respiratory effort, no problems with respiration noted  ?Abdomen: Soft, gravid, appropriate for gestational age. Pain/Pressure: Present     ?Pelvic: Vag. Bleeding: None     ?Cervical exam performed        ?Extremities: Normal range of motion.     ?Mental Status: Normal mood and affect. Normal behavior. Normal judgment and thought content.  ? ?Urinalysis:     ? ?Assessment and Plan:  ?Pregnancy: G2P0010 at 106w1d ? ?1. Alpha thalassemia silent carrier ?Declined partner testing ? ?2. Encounter for supervision of low-risk pregnancy in third trimester ?BP elevated, see below ?FHR normal ? ?3. Urethral gland, cyst ? ? ?4. Gestational hypertension ?Meets criteria today ?Discussed  with Dr. Adrian Blackwater who is in agreement for direct admit for IOL ? ?Term labor symptoms and general obstetric precautions including but not limited to vaginal bleeding, contractions, leaking of fluid and fetal movement were reviewed in detail with the patient. ?Please refer to After Visit Summary for other counseling recommendations.  ?Return in 6 weeks (on 10/09/2021) for PP check. ? ? ?Venora Maples, MD ? ?

## 2021-08-28 NOTE — Patient Instructions (Signed)

## 2021-08-28 NOTE — H&P (Addendum)
OBSTETRIC ADMISSION HISTORY AND PHYSICAL ? ?Tracy Cohen is a 31 y.o. female G2P0010 with IUP at [redacted]w[redacted]d by 9w [place] presenting for IOL 2/2 to gHTN. She reports +FMs, No LOF, no VB, no blurry vision, headaches or peripheral edema, and RUQ pain.  She plans on breast feeding. She request Nexplanon for birth control. ?She received her prenatal care at [facility]  ? ?Dating: By 9w [place] --->  Estimated Date of Delivery: 09/03/21 ? ?Sono:   ? ?@[redacted]w[redacted]d , CWD, normal anatomy, cephalic presentation, posterior placental lie, 255g, 73% EFW ? ? ?Prenatal History/Complications:  ?-- alpha thalassemia silent carrier ?-- gHTN ? ?Past Medical History: ?Past Medical History:  ?Diagnosis Date  ? Allergy   ? Anxiety   ? Depression   ? Migraine   ? ? ?Past Surgical History: ?Past Surgical History:  ?Procedure Laterality Date  ? ADENOIDECTOMY    ? TONSILLECTOMY    ? ? ?Obstetrical History: ?OB History   ? ? Gravida  ?2  ? Para  ?0  ? Term  ?0  ? Preterm  ?0  ? AB  ?1  ? Living  ?0  ?  ? ? SAB  ?0  ? IAB  ?1  ? Ectopic  ?0  ? Multiple  ?0  ? Live Births  ?0  ?   ?  ?  ? ? ?Social History ?Social History  ? ?Socioeconomic History  ? Marital status: Single  ?  Spouse name: Not on file  ? Number of children: 0  ? Years of education: 70  ? Highest education level: Associate degree: occupational, Hotel manager, or vocational program  ?Occupational History  ?  Employer: [employer]  ?Tobacco Use  ? Smoking status: Former  ?  Types: Cigarettes  ? Smokeless tobacco: Never  ?Vaping Use  ? Vaping Use: Never used  ?Substance and Sexual Activity  ? Alcohol use: Not Currently  ?  Comment: 6/week (on weekend)  ? Drug use: Not Currently  ? Sexual activity: Yes  ?  Birth control/protection: None  ?Other Topics Concern  ? Not on file  ?Social History Narrative  ? Patient lives at home alone.   ? Patient works for [employer].  ? Patient has some college education.  ? Right handed.  ?   ? Patient now works for [employer] and has received an associated an degree.  ?   ? ?Social  Determinants of Health  ? ?Financial Resource Strain: Not on file  ?Food Insecurity: No Food Insecurity  ? Worried About Charity fundraiser in the Last Year: Never true  ? Ran Out of Food in the Last Year: Never true  ?Transportation Needs: No Transportation Needs  ? Lack of Transportation (Medical): No  ? Lack of Transportation (Non-Medical): No  ?Physical Activity: Not on file  ?Stress: Not on file  ?Social Connections: Not on file  ? ? ?Family History: ?Family History  ?Problem Relation Age of Onset  ? High blood pressure Mother   ? Mental illness Father   ?     schizophrenia  ? Esophageal cancer Father   ? Stroke Maternal Grandmother   ? Mental illness Maternal Grandmother   ?     bipolar  ? Kidney cancer Maternal Grandmother   ? Cancer Maternal Grandfather   ? Prostate cancer Maternal Grandfather   ? Colon cancer Maternal Grandfather   ? Mental illness Paternal Grandmother   ? Cancer Paternal Grandmother   ? Kidney cancer Paternal Grandmother   ?  Mental illness Paternal Grandfather   ? ? ?Allergies: ?No Known Allergies ? ?Medications Prior to Admission  ?Medication Sig Dispense Refill Last Dose  ? famotidine (PEPCID) 40 MG tablet Take 1 tablet (40 mg total) by mouth daily. 30 tablet 5 08/27/2021  ? Prenatal Vit-Fe Fumarate-FA (PRENATAL VITAMIN) 27-0.8 MG TABS Take 1 tablet by mouth daily. 30 tablet 12 08/28/2021  ? Elastic Bandages & Supports (COMFORT FIT MATERNITY SUPP MED) MISC 1 Device by Does not apply route as needed. (Patient not taking: Reported on 08/20/2021) 1 each 0 Unknown  ? polyethylene glycol powder (GLYCOLAX/MIRALAX) 17 GM/SCOOP powder Take 17 g by mouth daily as needed. (Patient not taking: Reported on 08/20/2021) 510 g 1 Unknown  ? ? ? ?Review of Systems  ? ?All systems reviewed and negative except as stated in HPI ? ?Last menstrual period 11/22/2020. ?General appearance: alert, cooperative, no distress, and anxious and nervous ?Lungs: normal work of breathing  ?Heart: regular rate  ?Abdomen:  soft, non-tender; bowel sounds normal ?Pelvic: Normal external genitalia  ?Extremities: Homans sign is negative, no sign of DVT ?Presentation: cephalic ?Fetal monitoringBaseline: 160 bpm, Variability: Good {> 6 bpm), and Accelerations: Reactive ?Uterine activityNone ?  ? ? ?Prenatal labs: ?ABO, Rh: O/Positive/-- (10/25 1028) ?Antibody: Negative (10/25 1028) ?Rubella: 3.67 (10/25 1028) ?RPR: Non Reactive (01/31 [id number])  ?HBsAg: Negative (10/25 1028)  ?HIV: Non Reactive (01/31 [id number])  ?GBS: Negative/-- (04/03 1204)  ?2 hr GTT: normal ?Genetic screening  NIPS: low risk, AFP: normal, Horizon: silent alpha thal ?Anatomy US normal ? ?Prenatal Transfer Tool  ?Maternal Diabetes: No ?Genetic Screening: Abnormal:  Results: Other:Alpha thal silent carrier ?Maternal Ultrasounds/Referrals: Normal ?Fetal Ultrasounds or other Referrals:  None ?Maternal Substance Abuse:  No ?Significant Maternal Medications:  None ?Significant Maternal Lab Results: Group B Strep negative ? ?No results found for this or any previous visit (from the past 24 hour(s)). ? ?Patient Active Problem List  ? Diagnosis Date Noted  ? Gestational hypertension 07/18/2021  ? Urethral gland, cyst 06/20/2021  ? Alpha thalassemia silent carrier 03/13/2021  ? Supervision of low-risk pregnancy 02/07/2021  ? Atopic dermatitis 10/13/2020  ? Gastroesophageal reflux disease 07/07/2018  ? 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 GAD (generalized anxiety disorder) 12/13/2013  ? Migraine 04/23/2013  ? ? ?Assessment/Plan:  ?Tracy Cohen is a 31 y.o. G2P0010 at [redacted]w[redacted]d here for IOL 2/2 to gHTN ? ?#Labor: Start augmentation of labor.  Start cytotec, check patient in 4 hours.  Patient is not interested in trying a foley balloon during this augmentation ?#Pain: PRN, planning on epidural ?#FWB: Cat 1 ?#ID:  GBS negative  ?#MOF: Breast  ?#MOC:Nexplanon ?#Circ:  N/A ? ?#gHTN:  Continue to monitor blood pressure      ? ?Noralee Stain, DO, PGY-1  ?08/28/2021,  8:27 PM ? ? Attestation: ? ?I confirm that I have verified the information documented in the resident?s note and that I have also personally reperformed the physical exam and all medical decision making activities.  ? ?The patient was seen and examined by me also ?Agree with note ?NST reactive and reassuring ?UCs as listed ?Cervical exams as listed in note ? ?Seabron Spates, CNM ? ? ?

## 2021-08-29 ENCOUNTER — Inpatient Hospital Stay (HOSPITAL_COMMUNITY): Payer: BC Managed Care – PPO | Admitting: Anesthesiology

## 2021-08-29 ENCOUNTER — Encounter (HOSPITAL_COMMUNITY): Payer: Self-pay | Admitting: Obstetrics and Gynecology

## 2021-08-29 DIAGNOSIS — Z3A39 39 weeks gestation of pregnancy: Secondary | ICD-10-CM

## 2021-08-29 DIAGNOSIS — O8612 Endometritis following delivery: Principal | ICD-10-CM | POA: Diagnosis not present

## 2021-08-29 DIAGNOSIS — O134 Gestational [pregnancy-induced] hypertension without significant proteinuria, complicating childbirth: Secondary | ICD-10-CM

## 2021-08-29 LAB — COMPREHENSIVE METABOLIC PANEL
ALT: 14 U/L (ref 0–44)
AST: 20 U/L (ref 15–41)
Albumin: 2.9 g/dL — ABNORMAL LOW (ref 3.5–5.0)
Alkaline Phosphatase: 132 U/L — ABNORMAL HIGH (ref 38–126)
Anion gap: 9 (ref 5–15)
BUN: 6 mg/dL (ref 6–20)
CO2: 21 mmol/L — ABNORMAL LOW (ref 22–32)
Calcium: 9.2 mg/dL (ref 8.9–10.3)
Chloride: 106 mmol/L (ref 98–111)
Creatinine, Ser: 0.58 mg/dL (ref 0.44–1.00)
GFR, Estimated: >60 mL/min (ref >60)
Glucose, Bld: 95 mg/dL (ref 70–99)
Potassium: 3.8 mmol/L (ref 3.5–5.1)
Sodium: 136 mmol/L (ref 135–145)
Total Bilirubin: 0.3 mg/dL (ref 0.3–1.2)
Total Protein: 6.9 g/dL (ref 6.5–8.1)

## 2021-08-29 LAB — CBC
HCT: 35.3 % — ABNORMAL LOW (ref 36.0–46.0)
HCT: 30.3 % — ABNORMAL LOW (ref 36.0–46.0)
Hemoglobin: 11.4 g/dL — ABNORMAL LOW (ref 12.0–15.0)
Hemoglobin: 10 g/dL — ABNORMAL LOW (ref 12.0–15.0)
MCH: 27.5 pg (ref 26.0–34.0)
MCH: 28 pg (ref 26.0–34.0)
MCHC: 32.3 g/dL (ref 30.0–36.0)
MCHC: 33 g/dL (ref 30.0–36.0)
MCV: 85.3 fL (ref 80.0–100.0)
MCV: 84.9 fL (ref 80.0–100.0)
Platelets: 238 10*3/uL (ref 150–400)
Platelets: 188 10*3/uL (ref 150–400)
RBC: 4.14 MIL/uL (ref 3.87–5.11)
RBC: 3.57 MIL/uL — ABNORMAL LOW (ref 3.87–5.11)
RDW: 14.2 % (ref 11.5–15.5)
RDW: 14.1 % (ref 11.5–15.5)
WBC: 12.2 10*3/uL — ABNORMAL HIGH (ref 4.0–10.5)
WBC: 16.9 10*3/uL — ABNORMAL HIGH (ref 4.0–10.5)
nRBC: 0 % (ref 0.0–0.2)
nRBC: 0 % (ref 0.0–0.2)

## 2021-08-29 LAB — TYPE AND SCREEN
ABO/RH(D): O POS
Antibody Screen: NEGATIVE

## 2021-08-29 LAB — RPR: RPR Ser Ql: NONREACTIVE

## 2021-08-29 MED ORDER — ONDANSETRON HCL 4 MG PO TABS: 4.0000 mg | ORAL_TABLET | ORAL | Status: DC | PRN

## 2021-08-29 MED ORDER — PHENYLEPHRINE 80 MCG/ML (10ML) SYRINGE FOR IV PUSH (FOR BLOOD PRESSURE SUPPORT): 80.0000 ug | PREFILLED_SYRINGE | INTRAVENOUS | Status: DC | PRN

## 2021-08-29 MED ORDER — ACETAMINOPHEN 325 MG PO TABS: 650.0000 mg | ORAL_TABLET | ORAL | Status: DC | PRN

## 2021-08-29 MED ORDER — PRENATAL MULTIVITAMIN CH
1.0000 | ORAL_TABLET | Freq: Every day | ORAL | Status: DC
  Administered 2021-08-30: 1 via ORAL
  Filled 2021-08-29: qty 1

## 2021-08-29 MED ORDER — FAMOTIDINE 20 MG PO TABS
40.0000 mg | ORAL_TABLET | Freq: Every day | ORAL | Status: DC
  Administered 2021-08-29 – 2021-08-30 (×2): 40 mg via ORAL
  Filled 2021-08-29 (×2): qty 2

## 2021-08-29 MED ORDER — PHENYLEPHRINE 80 MCG/ML (10ML) SYRINGE FOR IV PUSH (FOR BLOOD PRESSURE SUPPORT)
80.0000 ug | PREFILLED_SYRINGE | INTRAVENOUS | Status: DC | PRN
  Filled 2021-08-29: qty 10

## 2021-08-29 MED ORDER — OXYCODONE HCL 5 MG PO TABS
10.0000 mg | ORAL_TABLET | ORAL | Status: DC | PRN
  Filled 2021-08-29: qty 2

## 2021-08-29 MED ORDER — OXYTOCIN-SODIUM CHLORIDE 30-0.9 UT/500ML-% IV SOLN
1.0000 m[IU]/min | INTRAVENOUS | Status: DC
  Administered 2021-08-29: 1 m[IU]/min via INTRAVENOUS

## 2021-08-29 MED ORDER — TERBUTALINE SULFATE 1 MG/ML IJ SOLN: 0.2500 mg | Freq: Once | INTRAMUSCULAR | Status: DC | PRN

## 2021-08-29 MED ORDER — FENTANYL CITRATE (PF) 100 MCG/2ML IJ SOLN
100.0000 ug | INTRAMUSCULAR | Status: DC | PRN
  Administered 2021-08-29 (×3): 100 ug via INTRAVENOUS
  Filled 2021-08-29 (×3): qty 2

## 2021-08-29 MED ORDER — FUROSEMIDE 20 MG PO TABS
20.0000 mg | ORAL_TABLET | Freq: Every day | ORAL | Status: DC
Start: 2021-08-29 — End: 2021-08-30
  Administered 2021-08-29 – 2021-08-30 (×2): 20 mg via ORAL
  Filled 2021-08-29 (×2): qty 1

## 2021-08-29 MED ORDER — LIDOCAINE HCL (PF) 1 % IJ SOLN
INTRAMUSCULAR | Status: DC | PRN
  Administered 2021-08-29: 6 mL via EPIDURAL

## 2021-08-29 MED ORDER — ONDANSETRON HCL 4 MG/2ML IJ SOLN: 4.0000 mg | INTRAMUSCULAR | Status: DC | PRN

## 2021-08-29 MED ORDER — COCONUT OIL OIL: 1.0000 "application " | TOPICAL_OIL | Status: DC | PRN

## 2021-08-29 MED ORDER — GENTAMICIN SULFATE 40 MG/ML IJ SOLN
5.0000 mg/kg | INTRAVENOUS | Status: DC
  Filled 2021-08-29: qty 8.75

## 2021-08-29 MED ORDER — BUPIVACAINE HCL (PF) 0.25 % IJ SOLN
INTRAMUSCULAR | Status: DC | PRN
  Administered 2021-08-29: 8 mL via EPIDURAL

## 2021-08-29 MED ORDER — BENZOCAINE-MENTHOL 20-0.5 % EX AERO
1.0000 | INHALATION_SPRAY | CUTANEOUS | Status: DC | PRN
Start: 2021-08-29 — End: 2021-08-30
  Administered 2021-08-29: 1 via TOPICAL
  Filled 2021-08-29 (×2): qty 56

## 2021-08-29 MED ORDER — SIMETHICONE 80 MG PO CHEW: 80.0000 mg | CHEWABLE_TABLET | ORAL | Status: DC | PRN

## 2021-08-29 MED ORDER — LACTATED RINGERS IV SOLN
500.0000 mL | Freq: Once | INTRAVENOUS | Status: AC
  Administered 2021-08-29: 500 mL via INTRAVENOUS

## 2021-08-29 MED ORDER — DIPHENHYDRAMINE HCL 25 MG PO CAPS
25.0000 mg | ORAL_CAPSULE | Freq: Four times a day (QID) | ORAL | Status: DC | PRN
Start: 2021-08-29 — End: 2021-08-30

## 2021-08-29 MED ORDER — MAGNESIUM HYDROXIDE 400 MG/5ML PO SUSP: 30.0000 mL | ORAL | Status: DC | PRN

## 2021-08-29 MED ORDER — IBUPROFEN 600 MG PO TABS
600.0000 mg | ORAL_TABLET | Freq: Four times a day (QID) | ORAL | Status: DC
  Administered 2021-08-29 – 2021-08-30 (×3): 600 mg via ORAL
  Filled 2021-08-29 (×3): qty 1

## 2021-08-29 MED ORDER — FENTANYL-BUPIVACAINE-NACL 0.5-0.125-0.9 MG/250ML-% EP SOLN
12.0000 mL/h | EPIDURAL | Status: DC | PRN
  Administered 2021-08-29: 12 mL/h via EPIDURAL
  Filled 2021-08-29: qty 250

## 2021-08-29 MED ORDER — DIPHENHYDRAMINE HCL 50 MG/ML IJ SOLN: 12.5000 mg | INTRAMUSCULAR | Status: DC | PRN

## 2021-08-29 MED ORDER — DIBUCAINE (PERIANAL) 1 % EX OINT
1.0000 | TOPICAL_OINTMENT | CUTANEOUS | Status: DC | PRN
Start: 2021-08-29 — End: 2021-08-30

## 2021-08-29 MED ORDER — SODIUM CHLORIDE 0.9 % IV SOLN
3.0000 g | Freq: Four times a day (QID) | INTRAVENOUS | Status: AC
  Administered 2021-08-29 – 2021-08-30 (×3): 3 g via INTRAVENOUS
  Filled 2021-08-29 (×6): qty 8

## 2021-08-29 MED ORDER — OXYCODONE HCL 5 MG PO TABS: 5.0000 mg | ORAL_TABLET | ORAL | Status: DC | PRN

## 2021-08-29 MED ORDER — SENNOSIDES-DOCUSATE SODIUM 8.6-50 MG PO TABS: 2.0000 | ORAL_TABLET | ORAL | Status: DC

## 2021-08-29 MED ORDER — WITCH HAZEL-GLYCERIN EX PADS: 1.0000 "application " | MEDICATED_PAD | CUTANEOUS | Status: DC | PRN

## 2021-08-29 MED ORDER — NIFEDIPINE ER OSMOTIC RELEASE 30 MG PO TB24
30.0000 mg | ORAL_TABLET | Freq: Every day | ORAL | Status: DC
Start: 2021-08-29 — End: 2021-08-30
  Administered 2021-08-29 – 2021-08-30 (×2): 30 mg via ORAL
  Filled 2021-08-29 (×2): qty 1

## 2021-08-29 MED ORDER — EPHEDRINE 5 MG/ML INJ: 10.0000 mg | INTRAVENOUS | Status: DC | PRN

## 2021-08-29 MED ORDER — CALCIUM CARBONATE ANTACID 500 MG PO CHEW
1.0000 | CHEWABLE_TABLET | ORAL | Status: DC | PRN
  Administered 2021-08-29: 200 mg via ORAL
  Filled 2021-08-29: qty 1

## 2021-08-29 MED ORDER — FENTANYL CITRATE (PF) 100 MCG/2ML IJ SOLN
INTRAMUSCULAR | Status: AC
  Administered 2021-08-29: 100 ug
  Filled 2021-08-29: qty 2

## 2021-08-29 NOTE — Progress Notes (Signed)
LABOR PROGRESS NOTE ? ?Tracy Cohen is a 31 y.o. G2P0010 at [redacted]w[redacted]d  admitted for IOL for gHTN. ? ?Subjective: ?Comfortable w epidural ? ?Objective: ?BP 131/83   Pulse 91   Temp 97.9 ?F (36.6 ?C) (Oral)   Resp 17   Ht 5\' 4"  (1.626 m)   Wt 91.5 kg   LMP 11/22/2020   SpO2 99%   BMI 34.64 kg/m?  or  ?Vitals:  ? 08/29/21 0830 08/29/21 0835 08/29/21 0840 08/29/21 0901  ?BP: 130/80 (!) 144/99 129/79 131/83  ?Pulse: 78 91 94 91  ?Resp:   17   ?Temp:      ?TempSrc:      ?SpO2: 99% 100% 99%   ?Weight:      ?Height:      ? ? ? ?Dilation: 1.5 ?Effacement (%): 80 ?Station: -2 ?Presentation: Vertex ?Exam by:: [email or web address], RN ?FHT: baseline rate 145, moderate varibility, +acel, some variable decel ?Toco: q1-3 min ? ?Labs: ?Lab Results  ?Component Value Date  ? WBC 12.2 (H) 08/29/2021  ? HGB 11.4 (L) 08/29/2021  ? HCT 35.3 (L) 08/29/2021  ? MCV 85.3 08/29/2021  ? PLT 238 08/29/2021  ? ? ?Patient Active Problem List  ? Diagnosis Date Noted  ? Gestational hypertension 07/18/2021  ? Urethral gland, cyst 06/20/2021  ? Alpha thalassemia silent carrier 03/13/2021  ? Supervision of low-risk pregnancy 02/07/2021  ? Atopic dermatitis 10/13/2020  ? Gastroesophageal reflux disease 07/07/2018  ? 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 GAD (generalized anxiety disorder) 12/13/2013  ? Migraine 04/23/2013  ? ? ?Assessment / Plan: ?31 y.o. G2P0010 at [redacted]w[redacted]d here for IOL for gHTN. ? ?Labor: s/p cytotec x1 and brief period on pit. Just got epidural and comfortable, cervix 2.5/90/-2, recommended foley balloon and then assessment for AROM once it is out, she is in agreement with this plan, FB placed w 60 cc at 0915. ?Fetal Wellbeing:  Cat II but overall very reassuring with moderate variability and numerous accels ?Pain Control:  epidural ?GBS: neg ?Anticipated MOD:  NSVD ? ?gHTN: normal to mild range BP's, ctm ? ?01-20-1974, MD/MPH ?Attending Family Medicine Physician, Faculty Practice ?[facility], [facility] ?  ?08/29/2021, 9:15 AM ? ?

## 2021-08-29 NOTE — Lactation Note (Signed)
This note was copied from a baby's chart. ?Lactation Consultation Note ? ?Patient Name: Tracy Cohen ?Today's Date: 08/29/2021 ?  ?Age:31 hours ? ?Attempted to visit with mom but L&D RN Dahlia Client advised to visit with mom once she gets transferred to her room, she was getting ready to leave soon. LC to F/U in MBU. ? ?Tavionna Grout S Jamyrah Saur ?08/29/2021, 5:11 PM ? ? ? ?

## 2021-08-29 NOTE — Progress Notes (Signed)
Notified by RN of elevated temp to 101.2 F, on repeat a half hour later was still elevated at 100.5 F, and given fetal tachycardia towards end of labor as well as patient's current tachycardia concerning for post partum endometritis. Will start gentamicin and vancomycin (clindamycin on back order). Discussed this with patient and the rationale. Continue antibiotics for 24 hours. ? ?BP's also creeping up. Will start Nifedipine 30 XL daily and lasix 20 mg PO daily.  ? ?Venora Maples, MD/MPH ?Attending Family Medicine Physician, Faculty Practice ?[facility] for [employer], [facility] ? ?5:13 PM ?08/29/21 ? ?

## 2021-08-29 NOTE — Progress Notes (Signed)
Labor Progress Note ?Tracy Cohen is a 31 y.o. G2P0010 at [redacted]w[redacted]d presented for IOL 2/2 gHTN ? ?S: Patient becoming a little more uncomfortable with checks, was given 1 dose of fentanyl with some relief  ? ?O:  ?BP 123/73   Pulse 92   Temp 98.2 ?F (36.8 ?C) (Oral)   Resp 17   Ht 5\' 4"  (1.626 m)   Wt 91.5 kg   LMP 11/22/2020   BMI 34.64 kg/m?  ?EFM: baseline 170/moderate varibility/+ accels, no decels  ? ?CVE: Dilation: 1 ?Effacement (%): 80 ?Station: -3 ?Presentation: Vertex ?Exam by:: Lelan Pons, CNM ? ? ?A&P: 31 y.o. G2P0010 [redacted]w[redacted]d  ? ?#Labor: Progressing well. Patient was having some variables and baby remains tachy s/p fluids, have been repositioning patient and will start Pitocin as cervix has thinned and patient is refusing foley balloon ?#Pain: PRN, would like an epidural  ?#FWB: Cat 1 ?#GBS negative ? ?#gHTN: Continue to monitor blood pressures and symptoms   ? ?Noralee Stain, DO, PGY-1 ?2:49 AM  ?

## 2021-08-29 NOTE — Progress Notes (Signed)
LABOR PROGRESS NOTE ? ?Tracy Cohen is a 31 y.o. G2P0010 at [redacted]w[redacted]d  admitted for IOL for gHTN. ? ?Subjective: ?Comfortable w epidural ?Foley bulb is out ? ?Objective: ?BP 121/72   Pulse 83   Temp 98 ?F (36.7 ?C) (Oral)   Resp 16   Ht 5\' 4"  (1.626 m)   Wt 91.5 kg   LMP 11/22/2020   SpO2 99%   BMI 34.64 kg/m?  or  ?Vitals:  ? 08/29/21 0940 08/29/21 1000 08/29/21 1030 08/29/21 1100  ?BP: 128/80 (!) 119/97 (!) 119/98 121/72  ?Pulse: 82 100 (!) 107 83  ?Resp:   16   ?Temp:      ?TempSrc:      ?SpO2:      ?Weight:      ?Height:      ? ? ? ?Dilation: 5 ?Effacement (%): 90 ?Station: -2 ?Presentation: Vertex ?Exam by:: Dr. [email or web address] ?FHT: baseline rate 155, moderate varibility, +acel, no decel ?Toco: q1-3 min ? ?Labs: ?Lab Results  ?Component Value Date  ? WBC 12.2 (H) 08/29/2021  ? HGB 11.4 (L) 08/29/2021  ? HCT 35.3 (L) 08/29/2021  ? MCV 85.3 08/29/2021  ? PLT 238 08/29/2021  ? ? ?Patient Active Problem List  ? Diagnosis Date Noted  ? Gestational hypertension 07/18/2021  ? Urethral gland, cyst 06/20/2021  ? Alpha thalassemia silent carrier 03/13/2021  ? Supervision of low-risk pregnancy 02/07/2021  ? Atopic dermatitis 10/13/2020  ? Gastroesophageal reflux disease 07/07/2018  ? 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 GAD (generalized anxiety disorder) 12/13/2013  ? Migraine 04/23/2013  ? ? ?Assessment / Plan: ?31 y.o. G2P0010 at [redacted]w[redacted]d here for IOL for gHTN. ? ?Labor: s/p cytotec x1 and brief period on pit. FB placed two hours prior is now out, AROM performed for moderate amount of clear fluid. Has good ctx pattern, expectant management for now ?Fetal Wellbeing:  Cat I ?Pain Control:  epidural ?GBS: neg ?Anticipated MOD:  NSVD ? ?gHTN: normal to mild range BP's, ctm ? ?[redacted]w[redacted]d, MD/MPH ?Attending Family Medicine Physician, Faculty Practice ?[facility], [facility] ?  ?08/29/2021, 11:17 AM ? ?

## 2021-08-29 NOTE — Progress Notes (Signed)
LABOR PROGRESS NOTE ? ?Tracy Cohen is a 31 y.o. G2P0010 at [redacted]w[redacted]d  admitted for IOL for gHTN. ? ?Subjective: ?Feeling lots of rectal pressure ? ?Objective: ?BP (!) 143/89   Pulse (!) 106   Temp 98.2 ?F (36.8 ?C) (Oral)   Resp 19   Ht 5\' 4"  (1.626 m)   Wt 91.5 kg   LMP 11/22/2020   SpO2 99%   BMI 34.64 kg/m?  or  ?Vitals:  ? 08/29/21 1256 08/29/21 1300 08/29/21 1330 08/29/21 1400  ?BP: (!) 158/110 (!) 151/92 (!) 149/96 (!) 143/89  ?Pulse: 91 100 (!) 109 (!) 106  ?Resp:  18  19  ?Temp:      ?TempSrc:      ?SpO2:      ?Weight:      ?Height:      ? ? ? ?Dilation: 10 ?Dilation Complete Date: 08/29/21 ?Dilation Complete Time: 1343 ?Effacement (%): 100 ?Station: Plus 1 ?Presentation: Vertex ?Exam by:: [email or web address] RN ?FHT: baseline rate 145, moderate varibility, +acel, some variable decel ?Toco: q1-3 min ? ?Labs: ?Lab Results  ?Component Value Date  ? WBC 12.2 (H) 08/29/2021  ? HGB 11.4 (L) 08/29/2021  ? HCT 35.3 (L) 08/29/2021  ? MCV 85.3 08/29/2021  ? PLT 238 08/29/2021  ? ? ?Patient Active Problem List  ? Diagnosis Date Noted  ? Gestational hypertension 07/18/2021  ? Urethral gland, cyst 06/20/2021  ? Alpha thalassemia silent carrier 03/13/2021  ? Supervision of low-risk pregnancy 02/07/2021  ? Atopic dermatitis 10/13/2020  ? Gastroesophageal reflux disease 07/07/2018  ? 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 GAD (generalized anxiety disorder) 12/13/2013  ? Migraine 04/23/2013  ? ? ?Assessment / Plan: ?30 y.o. G2P0010 at [redacted]w[redacted]d here for IOL for gHTN. ? ?Labor: fully dilated and feels lots of pressure, good push with trial, has started pushing ?Fetal Wellbeing:  Cat II but overall reassuring with moderate variability and accels ?Pain Control:  epidural ?GBS: neg ?Anticipated MOD:  NSVD ? ?gHTN: BP's creeping up, has had isolated severe range but not sustained, watch closely ? ?[redacted]w[redacted]d, MD/MPH ?Attending Family Medicine Physician, Faculty Practice ?[facility] for [facility], [facility] ?  ?08/29/2021, 2:17 PM ? ?

## 2021-08-29 NOTE — Progress Notes (Addendum)
Patient ID: Tracy Cohen, female   DOB: 04/01/1991, 31 y.o.   MRN: [id number] ?Doing well, breathing through contrractions ? ?Vitals:  ? 08/28/21 2021 08/28/21 2122 08/29/21 0221  ?BP: (!) 150/91  123/73  ?Pulse: (!) 122  92  ?Resp: 18  17  ?Temp: 98.5 ?F (36.9 ?C)  98.2 ?F (36.8 ?C)  ?TempSrc: Oral  Oral  ?Weight:  91.5 kg   ?Height:  5\' 4"  (1.626 m)   ? ?Results for orders placed or performed during the hospital encounter of 08/28/21 (from the past 24 hour(s))  ?CBC     Status: Abnormal  ? Collection Time: 08/28/21  8:23 PM  ?Result Value Ref Range  ? WBC 14.0 (H) 4.0 - 10.5 K/uL  ? RBC 4.18 3.87 - 5.11 MIL/uL  ? Hemoglobin 11.4 (L) 12.0 - 15.0 g/dL  ? HCT 35.3 (L) 36.0 - 46.0 %  ? MCV 84.4 80.0 - 100.0 fL  ? MCH 27.3 26.0 - 34.0 pg  ? MCHC 32.3 30.0 - 36.0 g/dL  ? RDW 14.0 11.5 - 15.5 %  ? Platelets 247 150 - 400 K/uL  ? nRBC 0.0 0.0 - 0.2 %  ?Comprehensive metabolic panel     Status: Abnormal  ? Collection Time: 08/28/21  8:23 PM  ?Result Value Ref Range  ? Sodium 136 135 - 145 mmol/L  ? Potassium 3.8 3.5 - 5.1 mmol/L  ? Chloride 106 98 - 111 mmol/L  ? CO2 21 (L) 22 - 32 mmol/L  ? Glucose, Bld 95 70 - 99 mg/dL  ? BUN 6 6 - 20 mg/dL  ? Creatinine, Ser 0.58 0.44 - 1.00 mg/dL  ? Calcium 9.2 8.9 - 10.3 mg/dL  ? Total Protein 6.9 6.5 - 8.1 g/dL  ? Albumin 2.9 (L) 3.5 - 5.0 g/dL  ? AST 20 15 - 41 U/L  ? ALT 14 0 - 44 U/L  ? Alkaline Phosphatase 132 (H) 38 - 126 U/L  ? Total Bilirubin 0.3 0.3 - 1.2 mg/dL  ? GFR, Estimated >60 >60 mL/min  ? Anion gap 9 5 - 15  ?Type and screen [facility]     Status: None  ? Collection Time: 08/28/21  8:30 PM  ?Result Value Ref Range  ? ABO/RH(D) O POS   ? Antibody Screen NEG   ? Sample Expiration    ?  08/31/2021,2359 ?Performed at [facility], [address], [place], [place] ?  ?Protein / creatinine ratio, urine     Status: None  ? Collection Time: 08/28/21  9:26 PM  ?Result Value Ref Range  ? Creatinine, Urine 37.28 mg/dL  ? Total Protein, Urine <6  mg/dL  ? Protein Creatinine Ratio        0.00 - 0.15 mg/mg[Cre]  ?CBC     Status: Abnormal  ? Collection Time: 08/29/21  5:13 AM  ?Result Value Ref Range  ? WBC 12.2 (H) 4.0 - 10.5 K/uL  ? RBC 4.14 3.87 - 5.11 MIL/uL  ? Hemoglobin 11.4 (L) 12.0 - 15.0 g/dL  ? HCT 35.3 (L) 36.0 - 46.0 %  ? MCV 85.3 80.0 - 100.0 fL  ? MCH 27.5 26.0 - 34.0 pg  ? MCHC 32.3 30.0 - 36.0 g/dL  ? RDW 14.2 11.5 - 15.5 %  ? Platelets 238 150 - 400 K/uL  ? nRBC 0.0 0.0 - 0.2 %  ? ? ?FHR 130s, average variability ?UCs every 1.33min ? ?Cervix deferred ? ?Offered epidural or nitrous, considering ?

## 2021-08-29 NOTE — Discharge Summary (Signed)
Postpartum Discharge Summary ? ?   ?Patient Name: Tracy Cohen ?DOB: 03/17/1991 ?MRN: [id number] ? ?Date of admission: 08/28/2021 ?Delivery date:08/29/2021  ?Delivering provider: ECKSTAT, MATTHEW M  ?Date of discharge: 08/30/2021 ? ?Admitting diagnosis: Gestational hypertension [O13.9] ?Intrauterine pregnancy: [redacted]w[redacted]d     ?Secondary diagnosis:  Principal Problem: ?  Gestational hypertension ?Active Problems: ?  Supervision of low-risk pregnancy ?  Alpha thalassemia silent carrier ?  Postpartum endometritis ? ?Additional problems: Postpartum Endometritis    ?Discharge diagnosis: Term Pregnancy Delivered and Gestational Hypertension                                              ?Post partum procedures: none ?Augmentation: AROM, Pitocin, Cytotec, and IP Foley ?Complications: None ? ?Hospital course: Induction of Labor With Vaginal Delivery   ?31 y.o. yo G2P0010 at [redacted]w[redacted]d was admitted to the hospital 08/28/2021 for induction of labor.  Indication for induction: Gestational hypertension.  Patient had an uncomplicated labor course as follows: arrived at 0.5 cm dilation evening prior to day of delivery, induced initially with misoprostol and was 1.5 cm at which time foley was recommended but declined. She was started on pitocin briefly but this was shut off after less than an hour and she contracted well on her own. In the morning she was contracting well but still only 2.5 cm, she had an epidural placed at that time followed by foley balloon. Balloon came out several hours later, she was 5 cm and AROM for moderate amount of clear fluid was performed. She progressed to fully dilated over the next several hours and then pushed for approximately an hour and fifteen minutes to deliver a healthy baby girl "Hermes". ?Membrane Rupture Time/Date: 11:13 AM ,08/29/2021   ?Delivery Method:Vaginal, Spontaneous  ?Episiotomy: None  ?Lacerations:  Periurethral  ?Details of delivery can be found in separate delivery note.  Patient had a routine  postpartum course. Patient is discharged home 08/30/21. ? ?Newborn Data: ?Birth date:08/29/2021  ?Birth time:3:01 PM  ?Gender:Female  ?Living status:Living  ?Apgars:8 ,9  ?Weight:3260 g  ? ?Magnesium Sulfate received: No ?BMZ received: No ?Rhophylac:N/A ?MMR:N/A ?T-DaP:Given prenatally ?Flu: Yes ?Transfusion:No ? ?Physical exam  ?Vitals:  ? 08/29/21 1811 08/29/21 2215 08/30/21 0206 08/30/21 0618  ?BP: (!) 140/91 (!) 143/78 118/72 125/81  ?Pulse: (!) 107 (!) 137 (!) 106 93  ?Resp: 18 18 18 18  ?Temp:  98.6 ?F (37 ?C) 98 ?F (36.7 ?C) 97.9 ?F (36.6 ?C)  ?TempSrc:  Oral Oral Oral  ?SpO2:  99% 100% 99%  ?Weight:      ?Height:      ? ?General: alert, cooperative, and no distress ?Lochia: appropriate ?Uterine Fundus: firm ?Incision: N/A ?DVT Evaluation: No evidence of DVT seen on physical exam. ?Labs: ?Lab Results  ?Component Value Date  ? WBC 23.9 (H) 08/30/2021  ? HGB 9.3 (L) 08/30/2021  ? HCT 28.7 (L) 08/30/2021  ? MCV 84.7 08/30/2021  ? PLT 202 08/30/2021  ? ? ?  Latest Ref Rng & Units 08/28/2021  ?  8:23 PM  ?CMP  ?Glucose 70 - 99 mg/dL 95    ?BUN 6 - 20 mg/dL 6    ?Creatinine 0.44 - 1.00 mg/dL 0.58    ?Sodium 135 - 145 mmol/L 136    ?Potassium 3.5 - 5.1 mmol/L 3.8    ?Chloride 98 - 111 mmol/L 106    ?CO2   22 - 32 mmol/L 21    ?Calcium 8.9 - 10.3 mg/dL 9.2    ?Total Protein 6.5 - 8.1 g/dL 6.9    ?Total Bilirubin 0.3 - 1.2 mg/dL 0.3    ?Alkaline Phos 38 - 126 U/L 132    ?AST 15 - 41 U/L 20    ?ALT 0 - 44 U/L 14    ? ?Edinburgh Score: ?   ? View : No data to display.  ?  ?  ?  ? ? ? ?After visit meds:  ?Allergies as of 08/30/2021   ?No Known Allergies ?  ? ?  ?Medication List  ?  ? ?TAKE these medications   ? ?acetaminophen 325 MG tablet ?Commonly known as: Tylenol ?Take 2 tablets (650 mg total) by mouth every 4 (four) hours as needed (for pain scale < 4). ?  ?Comfort Fit Maternity Supp Med Misc ?1 Device by Does not apply route as needed. ?  ?famotidine 40 MG tablet ?Commonly known as: Pepcid ?Take 1 tablet (40 mg total)  by mouth daily. ?  ?furosemide 20 MG tablet ?Commonly known as: LASIX ?Take 1 tablet (20 mg total) by mouth daily for 4 days. ?  ?ibuprofen 600 MG tablet ?Commonly known as: ADVIL ?Take 1 tablet (600 mg total) by mouth every 6 (six) hours. ?  ?NIFEdipine 30 MG 24 hr tablet ?Commonly known as: ADALAT CC ?Take 1 tablet (30 mg total) by mouth daily. ?  ?polyethylene glycol powder 17 GM/SCOOP powder ?Commonly known as: GLYCOLAX/MIRALAX ?Take 17 g by mouth daily as needed. ?  ?Prenatal Vitamin 27-0.8 MG Tabs ?Take 1 tablet by mouth daily. ?  ? ?  ? ? ? ?Discharge home in stable condition ?Infant Feeding: Breast ?Infant Disposition:home with mother ?Discharge instruction: per After Visit Summary and Postpartum booklet. ?Activity: Advance as tolerated. Pelvic rest for 6 weeks.  ?Diet: routine diet ?Future Appointments: ?Future Appointments  ?Date Time Provider Department Center  ?09/28/2021  9:15 AM Eckstat, Matthew M, MD [facility]  ?11/09/2021  8:30 AM Jaffe, Adam R, DO [facility] None  ? ?Follow up Visit: ? Follow-up Information   ? ? [facility]. Schedule an appointment as soon as possible for a visit in 1 week(s).   ?Specialty: Obstetrics and Gynecology ?Contact information: ?[address] ?[place] [address] ?[phone] ? ?  ?  ? ?  ?  ? ?  ? ? ? ?Please schedule this patient for a In person postpartum visit in 4 weeks with the following provider:  Mom+Baby Provider . ?Additional Postpartum F/U:BP check 1 week  ?High risk pregnancy complicated by: HTN ?Delivery mode:  Vaginal, Spontaneous  ?Anticipated Birth Control:  Nexplanon ? ? ?08/30/2021 ? , CNM ? ? ? ?

## 2021-08-29 NOTE — Anesthesia Procedure Notes (Signed)
Epidural ?Patient location during procedure: OB ?Start time: 08/29/2021 7:58 AM ?End time: 08/29/2021 8:08 AM ? ?Staffing ?Anesthesiologist: Mellody Dance, MD ?Performed: anesthesiologist  ? ?Preanesthetic Checklist ?Completed: patient identified, IV checked, site marked, risks and benefits discussed, monitors and equipment checked, pre-op evaluation and timeout performed ? ?Epidural ?Patient position: sitting ?Prep: DuraPrep ?Patient monitoring: heart rate, cardiac monitor, continuous pulse ox and blood pressure ?Approach: midline ?Location: L2-L3 ?Injection technique: LOR saline ? ?Needle:  ?Needle type: Tuohy  ?Needle gauge: 17 G ?Needle length: 9 cm ?Needle insertion depth: 7 cm ?Catheter type: closed end flexible ?Catheter size: 20 Guage ?Catheter at skin depth: 12 cm ?Test dose: negative and Other ? ?Assessment ?Events: blood not aspirated, injection not painful, no injection resistance and negative IV test ? ?Additional Notes ?Informed consent obtained prior to proceeding including risk of failure, 1% risk of PDPH, risk of minor discomfort and bruising.  Discussed rare but serious complications including epidural abscess, permanent nerve injury, epidural hematoma.  Discussed alternatives to epidural analgesia and patient desires to proceed.  Timeout performed pre-procedure verifying patient name, procedure, and platelet count.  Patient tolerated procedure well. ? ? ? ? ?

## 2021-08-29 NOTE — Anesthesia Preprocedure Evaluation (Signed)
Anesthesia Evaluation  ?Patient identified by MRN, date of birth, ID band ?Patient awake ? ? ? ?Reviewed: ?Allergy & Precautions, NPO status , Patient's Chart, lab work & pertinent test results ? ?Airway ?Mallampati: II ? ?TM Distance: >3 FB ?Neck ROM: Full ? ? ? Dental ?no notable dental hx. ? ?  ?Pulmonary ?neg pulmonary ROS, former smoker,  ?  ?Pulmonary exam normal ?breath sounds clear to auscultation ? ? ? ? ? ? Cardiovascular ?hypertension, Normal cardiovascular exam ?Rhythm:Regular Rate:Normal ? ? ?  ?Neuro/Psych ? Headaches, PSYCHIATRIC DISORDERS Anxiety Depression   ? GI/Hepatic ?Neg liver ROS, GERD  ,  ?Endo/Other  ?negative endocrine ROS ? Renal/GU ?negative Renal ROS  ?negative genitourinary ?  ?Musculoskeletal ?negative musculoskeletal ROS ?(+)  ? Abdominal ?  ?Peds ?negative pediatric ROS ?(+)  Hematology ? ?(+) Blood dyscrasia, anemia ,   ?Anesthesia Other Findings ? ? Reproductive/Obstetrics ?(+) Pregnancy ? ?  ? ? ? ? ? ? ? ? ? ? ? ? ? ?  ?  ? ? ? ? ? ? ? ? ?Anesthesia Physical ?Anesthesia Plan ? ?ASA: 2 ? ?Anesthesia Plan: Epidural  ? ?Post-op Pain Management:   ? ?Induction:  ? ?PONV Risk Score and Plan: 2 and Treatment may vary due to age or medical condition ? ?Airway Management Planned: Natural Airway ? ?Additional Equipment:  ? ?Intra-op Plan:  ? ?Post-operative Plan:  ? ?Informed Consent: I have reviewed the patients History and Physical, chart, labs and discussed the procedure including the risks, benefits and alternatives for the proposed anesthesia with the patient or authorized representative who has indicated his/her understanding and acceptance.  ? ? ? ? ? ?Plan Discussed with: Anesthesiologist ? ?Anesthesia Plan Comments:   ? ? ? ? ? ? ?Anesthesia Quick Evaluation ? ?

## 2021-08-29 NOTE — Lactation Note (Signed)
This note was copied from a baby's chart. ?Lactation Consultation Note ? ?Patient Name: Tracy Cohen ?Today's Date: 08/29/2021 ?Reason for consult: Initial assessment;Primapara;1st time breastfeeding;Term ?Age:32 hours ? ?Visited with mom of 3 hours old FT female, she's a P1 and reported (+) breast changes during the pregnancy. Assisted with hand expression and latch, Ms. Grun able to get colostrum, LC took baby STS in cross cradle hold to the left breast and she required some repositioning, but able to sustain the latch after a couple of minutes "trying". Baby still nursing when exiting the room at the 7 minutes mark. Reviewed normal newborn behavior, cluster feeding, feeding cues, size of baby's stomach and expectations. ? ?Maternal Data ?Has patient been taught Hand Expression?: Yes ?Does the patient have breastfeeding experience prior to this delivery?: No ? ?Feeding ?Mother's Current Feeding Choice: Breast Milk ? ?LATCH Score ?Latch: Repeated attempts needed to sustain latch, nipple held in mouth throughout feeding, stimulation needed to elicit sucking reflex. ? ?Audible Swallowing: A few with stimulation ? ?Type of Nipple: Everted at rest and after stimulation ? ?Comfort (Breast/Nipple): Soft / non-tender ? ?Hold (Positioning): Assistance needed to correctly position infant at breast and maintain latch. ? ?LATCH Score: 7 ? ?Interventions ?Interventions: Breast feeding basics reviewed;Breast massage;Hand express;Education;LC Services brochure;Assisted with latch;Skin to skin;Breast compression;Adjust position ? ?Plan of care ?Encouraged mom to feed baby STS 8-12 times/24 hours or sooner if feeding cues are present ?Hand expression and spoon feeding were also encouraged ? ?GOB (maternal) present and supportive; she told LC this is the first baby in the family after 27 years; congratulated them on the arrival of her beautiful baby Tracy. All questions and concerns answered, family to contact [facility] services  PRN. ? ?Discharge ?Pump: Personal (Spectra) ? ?Consult Status ?Consult Status: Follow-up ?Date: 08/29/21 ?Follow-up type: In-patient ? ? ?Mickel Schreur S Saraiyah Hemminger ?08/29/2021, 6:12 PM ? ? ? ?

## 2021-08-30 LAB — CBC
HCT: 28.7 % — ABNORMAL LOW (ref 36.0–46.0)
Hemoglobin: 9.3 g/dL — ABNORMAL LOW (ref 12.0–15.0)
MCH: 27.4 pg (ref 26.0–34.0)
MCHC: 32.4 g/dL (ref 30.0–36.0)
MCV: 84.7 fL (ref 80.0–100.0)
Platelets: 202 10*3/uL (ref 150–400)
RBC: 3.39 MIL/uL — ABNORMAL LOW (ref 3.87–5.11)
RDW: 14.3 % (ref 11.5–15.5)
WBC: 23.9 10*3/uL — ABNORMAL HIGH (ref 4.0–10.5)
nRBC: 0 % (ref 0.0–0.2)

## 2021-08-30 MED ORDER — FUROSEMIDE 20 MG PO TABS: 20.0000 mg | ORAL_TABLET | Freq: Every day | ORAL | 0 refills | Status: DC

## 2021-08-30 MED ORDER — NIFEDIPINE ER 30 MG PO TB24: 30.0000 mg | ORAL_TABLET | Freq: Every day | ORAL | 0 refills | Status: DC

## 2021-08-30 MED ORDER — IBUPROFEN 600 MG PO TABS: 600.0000 mg | ORAL_TABLET | Freq: Four times a day (QID) | ORAL | 0 refills | Status: DC

## 2021-08-30 MED ORDER — ACETAMINOPHEN 325 MG PO TABS: 650.0000 mg | ORAL_TABLET | ORAL | 0 refills | Status: DC | PRN

## 2021-08-30 NOTE — Progress Notes (Signed)
POSTPARTUM PROGRESS NOTE ? ?Post Partum Day 1 ? ?Subjective: ? ?Tracy Cohen is a 31 y.o. G2P1011 s/p SVD at [redacted]w[redacted]d.  No acute events overnight.  Pt denies problems with ambulating, voiding or po intake.  She denies nausea or vomiting.  Pain is well controlled.  She has had flatus. She has not had bowel movement.  Lochia Minimal.  ? ?Objective: ?Blood pressure 125/81, pulse 93, temperature 97.9 ?F (36.6 ?C), temperature source Oral, resp. rate 18, height 5\' 4"  (1.626 m), weight 91.5 kg, last menstrual period 11/22/2020, SpO2 99 %, unknown if currently breastfeeding. ? ?Physical Exam:  ?General: alert, cooperative and no distress ?Chest: no respiratory distress ?Heart:regular rate, distal pulses intact ?Abdomen: soft, nontender,  ?Uterine Fundus: firm, appropriately tender ?DVT Evaluation: No calf swelling or tenderness ?Extremities: trace pitting edema bilaterally edema ?Skin: warm, dry;  ? ?Recent Labs  ?  08/29/21 ?1622 08/30/21 ?09/01/21  ?HGB 10.0* 9.3*  ?HCT 30.3* 28.7*  ? ? ?Assessment/Plan: ?Tracy Cohen is a 31 y.o. [phone] s/p SVD at [redacted]w[redacted]d  ? ?PPD#1 - Doing well ?Contraception: Nexplanon  ?Feeding: Breast  ?Dispo: Plan for discharge on PPD#2. ?#Iron deficiency anemia of pregnancy ?--Patient asymptomatic ?--Start oral iron every other day  ? ? LOS: 2 days  ? ?[redacted]w[redacted]d, DO, PGY-1 ?08/30/2021, 7:23 AM   ?

## 2021-08-30 NOTE — Lactation Note (Signed)
This note was copied from a baby's chart. ?Lactation Consultation Note ? ?Patient Name: Girl Dashana Guizar ?Today's Date: 08/30/2021 ?Reason for consult: Follow-up assessment;Mother's request;Primapara;Term ?Age:32 hours ? ?Since last night, infant has had minimal to no success with breastfeeding. I assisted Mom with getting comfortable on couch with proper pillow support. Infant latched with relative ease (sometimes the teacup hold was needed). Multiple, frequent swallows were heard to the naked ear for the remainder of time that I was in the room. Mom was comfortable with latch & also affirmed uterine cramping.  ? ?Specifics of an asymmetric latch were shown via [employer]. Mom was also informed of [employer]. ? ?Mom was pleased with consult.  ? ? ?Lurline Hare [place] ?08/30/2021, 1:25 PM ? ? ? ?

## 2021-08-30 NOTE — Social Work (Signed)
CSW received consult for hx of Anxiety and Depression.  CSW met with MOB to offer support and complete assessment.   ? ?MOB notes that she has struggled with anxiety/ depression that presents as panic attacks. She has been able to manage it with xanax in the past, but quit taking it when she was pregnant and will likely stay off of it while she breastfeeds. She states she has been to therapy in the past and has access to a psychologist through her doctor if she needs it. She notes she has been able to manage her mental health fairly well. MOB confirms she has everything she needs at home and that her sister will be staying with her for a few days. Her mother is also nearby. ? ?CSW provided education regarding the baby blues period vs. perinatal mood disorders, discussed treatment and gave resources for mental health follow up if concerns arise.  CSW recommends self-evaluation during the postpartum time period using the New Mom Checklist from Postpartum Progress and encouraged MOB to contact a medical professional if symptoms are noted at any time.   ? ?CSW provided review of Sudden Infant Death Syndrome (SIDS) precautions.   ?CSW identifies no further need for intervention and no barriers to discharge at this time. ? ?

## 2021-08-30 NOTE — Anesthesia Postprocedure Evaluation (Signed)
Anesthesia Post Note ? ?Patient: Tracy Cohen ? ?Procedure(s) Performed: AN AD HOC LABOR EPIDURAL ? ?  ? ?Patient location during evaluation: Mother Baby ?Anesthesia Type: Epidural ?Level of consciousness: awake, oriented and awake and alert ?Pain management: pain level controlled ?Vital Signs Assessment: post-procedure vital signs reviewed and stable ?Respiratory status: spontaneous breathing, respiratory function stable and nonlabored ventilation ?Cardiovascular status: stable ?Postop Assessment: no headache, adequate PO intake, able to ambulate, patient able to bend at knees and no apparent nausea or vomiting ?Anesthetic complications: no ? ? ?No notable events documented. ? ?Last Vitals:  ?Vitals:  ? 08/30/21 0206 08/30/21 0618  ?BP: 118/72 125/81  ?Pulse: (!) 106 93  ?Resp: 18 18  ?Temp: 36.7 ?C 36.6 ?C  ?SpO2: 100% 99%  ?  ?Last Pain:  ?Vitals:  ? 08/30/21 0810  ?TempSrc:   ?PainSc: 0-No pain  ? ?Pain Goal:   ? ?  ?  ?  ?  ?  ?  ?Epidural/Spinal Function Cutaneous sensation: Normal sensation (08/30/21 0810), Patient able to flex knees: Yes (08/30/21 0810), Patient able to lift hips off bed: Yes (08/30/21 0810), Back pain beyond tenderness at insertion site: No (08/30/21 0810), Progressively worsening motor and/or sensory loss: No (08/30/21 0810), Bowel and/or bladder incontinence post epidural: No (08/30/21 0810) ? ?Cortasia Screws ? ? ? ? ?

## 2021-08-31 ENCOUNTER — Ambulatory Visit: Payer: Self-pay

## 2021-08-31 NOTE — Lactation Note (Addendum)
This note was copied from a baby's chart. ?Lactation Consultation Note ? ?Patient Name: Tracy Cohen ?Today's Date: 08/31/2021 ?Reason for consult: Follow-up assessment;Primapara;Term ?Age:32 hours ? ?Mom with c/o sore nipples. Mom's breasts are heavier today than yesterday. Infant continues to latch with ease with swallows heard to the naked ear. Mom was taught how to do breast compressions to increase swallowing frequency & was taught how to palpate the breast to detect softening as infant feeds (Mom was able to note softening of the breast that infant was feeding from).  ? ?Infant lost 5 oz since the morning of 4/27, but infant had 3 voids & 7 stools during the time period between being weighed. ? ?Mom tearful during consult; encouragement and reassurance provided. Mom says she will have the support of her mom & sister at home.  ? ?Mom knows how to reach [place] for any post-discharge questions.  ? ?Interventions ?Interventions: Adjust position;Assisted with latch;Education;Breast compression ? ?Discharge ?Pump: Personal (Mom has a Spectra pump at home) ? ? ?[name] ?08/31/2021, 9:13 AM ? ? ? ?

## 2021-09-03 ENCOUNTER — Inpatient Hospital Stay (HOSPITAL_COMMUNITY): Admit: 2021-09-03 | Payer: Self-pay

## 2021-09-03 ENCOUNTER — Ambulatory Visit (INDEPENDENT_AMBULATORY_CARE_PROVIDER_SITE_OTHER): Payer: BC Managed Care – PPO

## 2021-09-03 VITALS — BP 126/82 | HR 100 | Wt 189.4 lb

## 2021-09-03 DIAGNOSIS — Z0130 Encounter for examination of blood pressure without abnormal findings: Secondary | ICD-10-CM

## 2021-09-03 NOTE — Progress Notes (Signed)
Pt here today for BP check s/p vaginal delivery on 08/29/21 with dx of gHTN.   Pt denies visual changes but currently has a headache that is mild on the left side.  BP 126/82.  Pt states that she does not usually take medication for the headaches as she has a history of migraines and the pain is not near that.  Pt observed with bilateral pitting edema in LE.  Pt reports last dose of Lasix is today.  Pt advised to continue to monitor for sx's of elevated BP and to call the office with concerns.  Pt verbalized understanding with no further questions.  ? ?Isobella Ascher,RN  ?09/03/21 ?

## 2021-09-04 ENCOUNTER — Telehealth: Payer: Self-pay

## 2021-09-04 DIAGNOSIS — O139 Gestational [pregnancy-induced] hypertension without significant proteinuria, unspecified trimester: Secondary | ICD-10-CM

## 2021-09-04 MED ORDER — LISINOPRIL 5 MG PO TABS: 5.0000 mg | ORAL_TABLET | Freq: Every day | ORAL | 0 refills | Status: DC

## 2021-09-04 NOTE — Telephone Encounter (Signed)
Elevated BP alert received from [employer]s of 153/93 with no symptoms. Per chart review pt delivered on 08/29/21. Called pt and VM left. Pt returned phone call and reports she rechecked BP and it is now 128/86. Reports continued headache. Has been takin ibuprofen for headache with minimal improvement. Reviewed with Alysia Penna, MD who recommends changing BP med to Lisinopril 5 mg daily. Reviewed with patient who will start BP med tomorrow morning in place of Nifedipine. Reviewed ibuprofen and Tylenol should be taken regularly to decrease perineal pain and irritation. Pt reports no BM since 08/28/21. Has taken Miralax; states she has not read the instructions and has just used a little powder each time. Encouraged pt to take Miralax every 2 hours until she has a BM, will then decrease to taking as needed. Pt to call tomorrow afternoon if she does not have a BM. Will return 09/13/21 for BP recheck.  ?

## 2021-09-06 ENCOUNTER — Encounter: Payer: BC Managed Care – PPO | Admitting: Family Medicine

## 2021-09-13 ENCOUNTER — Ambulatory Visit (INDEPENDENT_AMBULATORY_CARE_PROVIDER_SITE_OTHER): Payer: BC Managed Care – PPO | Admitting: Family Medicine

## 2021-09-13 ENCOUNTER — Encounter: Payer: Self-pay | Admitting: Radiology

## 2021-09-13 ENCOUNTER — Telehealth: Payer: Self-pay | Admitting: Family Medicine

## 2021-09-13 VITALS — BP 120/76 | HR 83 | Wt 179.8 lb

## 2021-09-13 DIAGNOSIS — N719 Inflammatory disease of uterus, unspecified: Secondary | ICD-10-CM | POA: Diagnosis not present

## 2021-09-13 DIAGNOSIS — F53 Postpartum depression: Secondary | ICD-10-CM

## 2021-09-13 DIAGNOSIS — R102 Pelvic and perineal pain: Secondary | ICD-10-CM

## 2021-09-13 DIAGNOSIS — Z0130 Encounter for examination of blood pressure without abnormal findings: Secondary | ICD-10-CM

## 2021-09-13 MED ORDER — HYDROXYZINE HCL 10 MG PO TABS: 10.0000 mg | ORAL_TABLET | Freq: Three times a day (TID) | ORAL | 2 refills | Status: DC | PRN

## 2021-09-13 MED ORDER — AMOXICILLIN-POT CLAVULANATE 875-125 MG PO TABS: 1.0000 | ORAL_TABLET | Freq: Two times a day (BID) | ORAL | 0 refills | Status: DC

## 2021-09-13 MED ORDER — OXYCODONE HCL 5 MG PO TABS: 5.0000 mg | ORAL_TABLET | ORAL | 0 refills | Status: DC | PRN

## 2021-09-13 MED ORDER — ACETAMINOPHEN 325 MG PO TABS: 650.0000 mg | ORAL_TABLET | ORAL | 0 refills | Status: DC | PRN

## 2021-09-13 MED ORDER — ESCITALOPRAM OXALATE 10 MG PO TABS: 10.0000 mg | ORAL_TABLET | Freq: Every day | ORAL | 12 refills | Status: DC

## 2021-09-13 MED ORDER — IBUPROFEN 600 MG PO TABS: 600.0000 mg | ORAL_TABLET | Freq: Four times a day (QID) | ORAL | 0 refills | Status: DC

## 2021-09-13 NOTE — Progress Notes (Signed)
Blood Pressure Check Visit ? ?Tracy Cohen is here for blood pressure check following induced vaginal delivery on 08/29/21. BP today is 120/76. Patient denies any dizziness, blurred vision, headache, shortness of breath, peripheral edema. Patient takes Nifedipine 30 mg daily. Patient states she took her Nifedipine this morning. I reviewed signs and symptoms of pre-eclampsia with patient.  ? ?Patient states she is very sore vaginally and has pain when sitting. I recommended patient try taking Tylenol or Ibuprofen as well as trying cold packs. I also recommended sitz baths to patient. Patient is tearful during appointment and states she feels emotional and overwhelmed. Patient states she has a good support system and declines [facility] when offered. I instructed patient to notify us if she changes her mind and would like to see [facility]. ? ?I reviewed patient's postpartum appointment date and time with her. Patient verbalized understanding and denies any other questions.  ? ?Seth Bake, RN ?09/13/2021   ?

## 2021-09-13 NOTE — Progress Notes (Addendum)
Mood check ENCOUNTER NOTE  Subjective:   Tracy Cohen is a 31 y.o. G63P1011 female here for a mood check due to concerns by RN at BP check. She was tearful and crying the whole visit.    Patient currently breastfeeding? No Have they been on prior SSRI/SNRI? No  EPDS = 9    Tracy Cohen reports she has limited support. Her mother and sister come 2-3 day per week to help. She is tearful and crying throughout exam. She reports vaginal pain. She is taking ibuprofen and tylenol but this does not help. She reports it is hard to sit and her "vagina is open."  She reports she is ok caring for the baby and denies thoughts of self harm or harming Hermes.   She reports she is overwhelmed and had racing thoughts and worries at night. She interacts with the infant during the exam and seems bonded.    Gynecologic History Patient's last menstrual period was 11/22/2020. Contraception: none  There are no preventive care reminders to display for this patient.   The following portions of the patient's history were reviewed and updated as appropriate: allergies, current medications, past family history, past medical history, past social history, past surgical history and problem list.  Review of Systems Behavioral/Psych: positive for decreased appetite, depression, fatigue, irritability, and loss of interest in favorite activities   Objective:  LMP 11/22/2020  Gen: well appearing, tearful and poor eye contact HEENT: no scleral icterus CV: RR Lung: Normal WOB Ext: warm well perfused  Abd: Uterine fundus was TTP. GU: normal appearing external genitalia. Patient was recoiling from any touch vaginally.  I provided support in an attempt to address the patient's vaginal concern. Labia were intact, perineum was intact-- only about to see externally. Patient withdrew consent for exam when I tried to spread the labia to inspect her vagina.  I stopped immediately per her request. +odor  Physical  Exam Psychiatric:        Attention and Perception: Attention normal.        Mood and Affect: Mood is anxious and depressed. Affect is flat and tearful.        Speech: Speech normal.        Behavior: Behavior is slowed.        Thought Content: Thought content is not delusional. Thought content does not include homicidal or suicidal ideation. Thought content does not include homicidal or suicidal plan.        Cognition and Memory: Cognition normal.        Judgment: Judgment normal.     Comments: Intermittent eye contact      Assessment and Plan:  1. Postpartum depression Lengthy discussion as this patient presents acutely depressed/anxious. She has been resistant to involving counseling and discussion medications in the pregnancy.  Asked if patient wanted to discuss medication and therapy now. She was open to this discussion.  - escitalopram (LEXAPRO) 10 MG tablet; Take 1 tablet (10 mg total) by mouth daily. Take 1/2 tablet for 1 week and then increase to a full tablet if no severe symptoms  Dispense: 30 tablet; Refill: 12 - hydrOXYzine (ATARAX) 10 MG tablet; Take 1 tablet (10 mg total) by mouth 3 (three) times daily as needed for anxiety.  Dispense: 30 tablet; Refill: 2 - Amb ref to [facility]  2. Vaginal pain Unable to perform full assessment due to patient withdrawing consent for exam  -She is two weeks out from delivery - Reviewed delivery not and  patient had "Lacerations: bilateral periuretheral, left side repaired with 4-0 vicryl on SH with buried knots at both ends." -I discussed empiric treatment for endometritis with augmentin as I cannot perform a full exam today. She was treated immediately postpartum for suspected endometritis for 24 hours.  - Discussed that medication for depression might help experience of pain - Recommended continued use of NSAID - Of note patient called back at 5:30 PM to after hours line  for complaints of pain ~20 minute discussion with  RN myself and Gershon Crane about her pain. She was saying "you are doing nothing to treat my pain." I discussed and reiterated the use of antibiotics to treat pain and my inability to perform a full exam today. I review alterative plans with the suggestion for a small course of percocet and reassessment on antibiotics in 1 week. Patient was resistant saying "I'll just deal with it" and I again discussed that we want to help her deal with this pain and must do so with appropriate follow up. Patient was notably crying on the telephone.  I asked specifically again about harm to self or baby and she denied. She is just "in so much pain." I again offered a short course of narcotic pain medications and close follow up once on antibiotics. She eventually accepted this plan. Dr. Tinnie Gens sent in the medications due to issues with my e-signing capabilities.     Please refer to After Visit Summary for other counseling recommendations.   Return in about 2 weeks (around 09/27/2021) for Mood check after starting medications.  Future Appointments  Date Time Provider Department Center  09/28/2021  9:15 AM Venora Maples, MD [facility] [place]  11/09/2021  8:30 AM Drema Dallas, DO [facility] None     Federico Flake, MD, MPH, ABFM Attending Physician Faculty Practice- Center for [facility]

## 2021-09-13 NOTE — Addendum Note (Signed)
Addended by: Reva Bores on: 09/13/2021 05:46 PM ? ? Modules accepted: Orders ? ?

## 2021-09-13 NOTE — Telephone Encounter (Signed)
Called patient to schedule appointment with Tracy Cohen, there was no answer to the phone call so a voicemail was left with the call back number for the office.  ?

## 2021-09-13 NOTE — Progress Notes (Signed)
Called patient afterhours. Dr Ernestina Patches spoke with patient.  ?

## 2021-09-17 ENCOUNTER — Telehealth: Payer: Self-pay

## 2021-09-17 ENCOUNTER — Encounter: Payer: Self-pay | Admitting: Family Medicine

## 2021-09-17 NOTE — Telephone Encounter (Addendum)
Federico Flake, MD  P [facility] ?Judeth Cornfield can you check in on [facility]-- also unsure where we left off with getting her a repeat exam this week-- I can see her Thurs just overbook her wherever :)  ? ? ?Call placed to pt. Spoke with pt. Pt states is feeling better and only picked up Rx antibiotics on Saturday. Pt also states having pain on outside of labia as she described at last appt with Dr Alvester Morin that is still not resolved. Pt has not used any OTC products for relief.Denies itching or irritation, just pain. "Thinks its from laceration repair from delivery on left side" pt is 2 weeks postpartum. ?Pt advised will be happy to see her this week for follow up. Pt declines to be seen. Pt advised to continue to take antibiotics and to keep PP appt on 5/26 with Dr Crissie Reese. Pt verbalized understanding. Pt advised that she can call or send mychart message if needs something before then.  ?Pt has home visit by [facility] on 5/16. Pt given number of home RN to contact with questions. Pt thankful for information.  ?Pt asked about mental health update, pt states is "fine and making it", States mother and sister that helps her 2-3 times a week have been at work all week. Pt declines further appt for [facility].  ? ?Will give follow up to Dr Alvester Morin.  ?Dyane Broberg,RN  ?

## 2021-09-21 ENCOUNTER — Other Ambulatory Visit: Payer: Self-pay | Admitting: Advanced Practice Midwife

## 2021-09-26 ENCOUNTER — Encounter: Payer: Self-pay | Admitting: Family Medicine

## 2021-09-28 ENCOUNTER — Ambulatory Visit (INDEPENDENT_AMBULATORY_CARE_PROVIDER_SITE_OTHER): Payer: BC Managed Care – PPO | Admitting: Family Medicine

## 2021-09-28 ENCOUNTER — Encounter: Payer: Self-pay | Admitting: Family Medicine

## 2021-09-28 DIAGNOSIS — O133 Gestational [pregnancy-induced] hypertension without significant proteinuria, third trimester: Secondary | ICD-10-CM | POA: Diagnosis not present

## 2021-09-28 DIAGNOSIS — F53 Postpartum depression: Secondary | ICD-10-CM | POA: Diagnosis not present

## 2021-09-28 DIAGNOSIS — Z30017 Encounter for initial prescription of implantable subdermal contraceptive: Secondary | ICD-10-CM | POA: Insufficient documentation

## 2021-09-28 MED ORDER — ETONOGESTREL 68 MG ~~LOC~~ IMPL
68.0000 mg | DRUG_IMPLANT | Freq: Once | SUBCUTANEOUS | Status: AC
  Administered 2021-09-28: 68 mg via SUBCUTANEOUS

## 2021-09-28 NOTE — Progress Notes (Signed)
Post Partum Visit Note  Tracy Cohen is a 31 y.o. G20P1011 female who presents for a postpartum visit. She is 4 weeks postpartum following a normal spontaneous vaginal delivery.  I have fully reviewed the prenatal and intrapartum course. The delivery was at [redacted]w[redacted]d gestational weeks.  Anesthesia: epidural. Postpartum course has been complicated by mild PP depression. Baby is doing well. Baby is feeding by bottle - Gerber Soy . Bleeding no bleeding. Bowel function is normal. Bladder function is normal. Patient is not sexually active. Contraception method is Nexplanon. Postpartum depression screening: negative.   The pregnancy intention screening data noted above was reviewed. Potential methods of contraception were discussed. The patient elected to proceed with No data recorded.   Edinburgh Postnatal Depression Scale - 09/28/21 0939       Edinburgh Postnatal Depression Scale:  In the Past 7 Days   I have been able to laugh and see the funny side of things. 1    I have looked forward with enjoyment to things. 1    I have blamed myself unnecessarily when things went wrong. 0    I have been anxious or worried for no good reason. 2    I have felt scared or panicky for no good reason. 0    Things have been getting on top of me. 1    I have been so unhappy that I have had difficulty sleeping. 0    I have felt sad or miserable. 0    I have been so unhappy that I have been crying. 1    The thought of harming myself has occurred to me. 0    Edinburgh Postnatal Depression Scale Total 6             There are no preventive care reminders to display for this patient.  The following portions of the patient's history were reviewed and updated as appropriate: allergies, current medications, past family history, past medical history, past social history, past surgical history, and problem list.  Review of Systems Pertinent items noted in HPI and remainder of comprehensive ROS otherwise  negative.  Objective:  BP 109/75   Pulse 85   Ht 5\' 5"  (1.651 m)   Wt 190 lb 3.2 oz (86.3 kg)   LMP  (LMP Unknown)   Breastfeeding No   BMI 31.65 kg/m    General:  alert, cooperative, and appears stated age   Breasts:  not indicated  Lungs: Comfortable on room air  GU exam:  not indicated       Assessment:    There are no diagnoses linked to this encounter.  Normal postpartum exam.   Plan:   Essential components of care per ACOG recommendations:  1.  Mood and well being: Patient with negative depression screening today. Reviewed local resources for support. Referred to [facility], not taking lexapro. - Patient tobacco use? No.   - hx of drug use? No.    2. Infant care and feeding:  -Patient currently breastmilk feeding? No.  -Social determinants of health (SDOH) reviewed in EPIC. No concerns  3. Sexuality, contraception and birth spacing - Patient does not want a pregnancy in the next year.  Desired family size is 1 children.  - Reviewed reproductive life planning. Reviewed contraceptive methods based on pt preferences and effectiveness.  Patient desired Hormonal Implant today.   - Discussed birth spacing of 18 months  4. Sleep and fatigue -Encouraged family/partner/community support of 4 hrs of uninterrupted sleep to help  with mood and fatigue  5. Physical Recovery  - Discussed patients delivery and complications. She describes her labor as good. - Patient had a Vaginal, no problems at delivery. Patient had a  perineal  laceration. Perineal healing reviewed. Patient expressed understanding - Patient has urinary incontinence? No. - Patient is safe to resume physical and sexual activity  6.  Health Maintenance - HM due items addressed No - up to date - Last pap smear NILM, neg HPV 10/18/2019. Pap smear not done at today's visit.  -Breast Cancer screening indicated? No.   7. Chronic Disease/Pregnancy Condition follow up: Hypertension Gestational HTN - BP today is  normal. Return in 1 week for BP check.  - PCP follow up  Venora Maples, MD Center for [facility], [facility]

## 2021-09-28 NOTE — Progress Notes (Signed)
     GYNECOLOGY OFFICE PROCEDURE NOTE  Tracy Cohen is a 31 y.o. G2P1011 here for Nexplanon insertion.  No unprotected intercourse since delivery.  Nexplanon insertion Procedure Patient identified, informed consent performed, consent signed.   Patient does understand that irregular bleeding is a very common side effect of this medication. She was advised to have backup contraception for one week after placement. Pregnancy test in clinic today was negative.  Appropriate time out taken.  Patient's left arm was prepped and draped in the usual sterile fashion. The ruler used to measure and mark insertion area.  Patient was prepped with alcohol swab and then injected with 3 ml of 1% lidocaine.  She was prepped with betadine, Nexplanon removed from packaging,  Device confirmed in needle, then inserted full length of needle and withdrawn per handbook instructions. Nexplanon was able to palpated in the patient's arm; patient palpated the insert herself. There was minimal blood loss.  Patient insertion site covered with guaze and a pressure bandage to reduce any bruising.  The patient tolerated the procedure well and was given post procedure instructions.  Venora Maples, MD/[facility] Family [facility], [facility] for [employer], [facility]

## 2021-10-02 NOTE — BH Specialist Note (Unsigned)
Integrated Behavioral Health via Telemedicine Visit  10/04/2021 [name] [id number]  Number of Integrated Behavioral Health Clinician visits: 1- Initial Visit  Session Start time: 1316   Session End time: 1342  Total time in minutes: 26   Referring Provider: Jaynie Collins, MD Patient/Family [facility] Provider location: Center for [facility] at [facility]  All persons participating in visit: [name] and [facility] Salvatore Shear   Types of Service: Individual psychotherapy and Video visit  I connected with Tracy Cohen and/or Tracy Cohen's  n/a  via  Telephone or Video Enabled Telemedicine Application  (Video is Caregility application) and verified that I am speaking with the correct person using two identifiers. Discussed confidentiality: Yes   I discussed the limitations of telemedicine and the availability of in person appointments.  Discussed there is a possibility of technology failure and discussed alternative modes of communication if that failure occurs.  I discussed that engaging in this telemedicine visit, they consent to the provision of behavioral healthcare and the services will be billed under their insurance.  Patient and/or legal guardian expressed understanding and consented to Telemedicine visit: Yes   Presenting Concerns: Patient and/or family reports the following symptoms/concerns: Lack of quality sleep due to baby's sleep schedule and financial stress being on unpaid maternity leave; pt's goal is to find childcare to return to work again on August 6th; family has been supportive.  Duration of problem: Postpartum; Severity of problem: moderate  Patient and/or Family's Strengths/Protective Factors: Social connections, Concrete supports in place (healthy food, safe environments, etc.), Sense of purpose, and Physical Health (exercise, healthy diet, medication compliance, etc.)  Goals Addressed: Patient  will:  Reduce symptoms of: anxiety, depression, and stress   Increase knowledge and/or ability of: healthy habits and stress reduction   Demonstrate ability to: Increase healthy adjustment to current life circumstances and Increase adequate support systems for patient/family  Progress towards Goals: Ongoing  Interventions: Interventions utilized:  Psychoeducation and/or Health Education, Link to [employer], and Supportive Reflection Standardized Assessments completed: GAD-7 and PHQ 9  Patient and/or Family Response: Patient agrees with treatment plan.  Assessment: Patient currently experiencing Adjustment disorder with mixed anxious and depressed mood and Psychosocial stress.   Patient may benefit from psychoeducation and brief therapeutic interventions regarding coping with symptoms of depression, anxiety, life stress .  Plan: Follow up with behavioral health clinician on : Two weeks Behavioral recommendations:  -Continue prioritizing healthy self care by sleeping when baby sleeps and eating to hunger -Continue to allow family to offer practical support -Consider registering for new mom support group online at either [email or web address] or www.[email or web address].net -Consider applying for EBT food benefits while on maternity leave -Contact [place] [employer] at  www.[employer].tn for help finding childcare  Referral(s): Integrated Art gallery manager (In Clinic) and [employer]:  New mom support, etc.  I discussed the assessment and treatment plan with the patient and/or parent/guardian. They were provided an opportunity to ask questions and all were answered. They agreed with the plan and demonstrated an understanding of the instructions.   They were advised to call back or seek an in-person evaluation if the symptoms worsen or if the condition fails to improve as anticipated.  Valetta Close Selah Klang, LCSW     10/04/2021    1:20 PM 08/28/2021     4:34 PM 08/20/2021   11:52 AM 08/06/2021    9:13 AM 08/02/2021   10:09 AM  Depression screen  PHQ 2/9  Decreased Interest 1 0 0 0 0  Down, Depressed, Hopeless 1 0 0 0 0  PHQ - 2 Score 2 0 0 0 0  Altered sleeping 0 0 0 0 0  Tired, decreased energy 3 0 0 0 0  Change in appetite 3 0 0 0 0  Feeling bad or failure about yourself  0 0 0 0 0  Trouble concentrating 0 0 0 0 0  Moving slowly or fidgety/restless 0 0 0 0 0  Suicidal thoughts 0 0 0 0 0  PHQ-9 Score 8 0 0 0 0  Difficult doing work/chores  Not difficult at all Not difficult at all Not difficult at all       10/04/2021    1:22 PM 08/28/2021    4:34 PM 08/20/2021   11:52 AM 08/06/2021    9:13 AM  GAD 7 : Generalized Anxiety Score  Nervous, Anxious, on Edge 1 0 0 0  Control/stop worrying 1 0 0 0  Worry too much - different things 1 0 0 0  Trouble relaxing 0 0 0 0  Restless 0 0 0 0  Easily annoyed or irritable 1 0 0 0  Afraid - awful might happen 0 0 0 0  Total GAD 7 Score 4 0 0 0  Anxiety Difficulty  Not difficult at all Not difficult at all Not difficult at all

## 2021-10-04 ENCOUNTER — Ambulatory Visit (INDEPENDENT_AMBULATORY_CARE_PROVIDER_SITE_OTHER): Payer: BC Managed Care – PPO | Admitting: Clinical

## 2021-10-04 DIAGNOSIS — Z658 Other specified problems related to psychosocial circumstances: Secondary | ICD-10-CM

## 2021-10-04 DIAGNOSIS — F4323 Adjustment disorder with mixed anxiety and depressed mood: Secondary | ICD-10-CM

## 2021-10-04 NOTE — Patient Instructions (Signed)
Center for [facility] at [facility] for Women [address] [place], [place] [address] [phone] (main office) [phone] [facility] office)  New Parent Support Groups [email or web address].net [email or web address]  Estate manager/land agent  (Childcare options, Early childcare development, etc.) [email or web address].cz  LIEAP (Low Income Risk manager) LowBlog.nl  [employer] (Low Income Automotive engineer) [email or web address].dk

## 2021-10-05 ENCOUNTER — Ambulatory Visit (INDEPENDENT_AMBULATORY_CARE_PROVIDER_SITE_OTHER): Payer: BC Managed Care – PPO

## 2021-10-05 DIAGNOSIS — Z0130 Encounter for examination of blood pressure without abnormal findings: Secondary | ICD-10-CM

## 2021-10-05 NOTE — Progress Notes (Signed)
Did not keep appointment.  Fleet Contras RN 10/05/21

## 2021-10-05 NOTE — BH Specialist Note (Signed)
Pt did not arrive to video visit and did not answer the phone; Left HIPPA-compliant message to call back Mirra Basilio from [facility] at [facility] at  [phone] ([place] Shall's office).  ?; left MyChart message for patient.  ? ?

## 2021-10-09 ENCOUNTER — Encounter: Payer: Self-pay | Admitting: *Deleted

## 2021-10-18 ENCOUNTER — Ambulatory Visit: Payer: BC Managed Care – PPO | Admitting: Clinical

## 2021-10-18 DIAGNOSIS — Z91199 Patient's noncompliance with other medical treatment and regimen due to unspecified reason: Secondary | ICD-10-CM

## 2021-11-08 NOTE — Progress Notes (Signed)
NEUROLOGY FOLLOW UP OFFICE NOTE  Tracy Cohen [id number]  Assessment/Plan:   Migraine without aura, without status migrainosus, not intractable   1.  as she has been doing well, will hold off on restarting Aimovig 2.  Refilled Nurtec as needed 3.  Follow up in 5 months.   Subjective:  Tracy Cohen is a 31 year old female pregnant at [redacted] weeks gestation who follows up for migraines.[name]   UPDATE: Patient delivered her baby on 4/26.  No complications.  She is not breastfeeding. She had headaches for the first 2 weeks (not migraine but more tension-type) but then they resolved.  Tylenol was ineffective. Frequency of abortive medication: 4 days a month Current NSAIDS: none Current analgesics:  Tylenol Current triptans:  none Current ergotamine:  none Current anti-emetic:  none Current muscle relaxants:  none Current anti-anxiolytic:  none Current sleep aide:  none Current Antihypertensive medications:  none Current Antidepressant/antipsychotic none Current Anticonvulsant medications:  none Current anti-CGRP:  prenatal Current Vitamins/Herbal/Supplements:  none Current Antihistamines/Decongestants:  none Other therapy:  none Hormone/birth control:  none   Caffeine:  No Alcohol:  no Smoker:  no Diet:  Water, protein, vegetables Exercise:  Yes.  Has a trainer. Depression:  yes; Anxiety:  yes Other pain:  no Sleep hygiene:  ok     HISTORY:  Onset:  Occasionally from age 43 through teenager.  Became more frequent in her mid 58s. Location:  Start bifrontal/temporal Quality:  Pounding and squeezing Intensity:  10/10.  She denies new headache, thunderclap headache  Aura:  no Premonitory Phase: no Postdrome: no Associated symptoms:  Dizziness, lightheadedness, nausea, vomiting, photophobia, phonophobia, osmophobia, vision loss (blurred vision to black), numbness in legs.  She denies associated unilateral weakness. Duration:  Usually 48 to 72 hours.  Anywhere from 1 1/2 days  up to 3 months Frequency:  2 to 3 times a month (10 days a month) Frequency of abortive medication: 10 days a month Triggers:  Anything with red dye, sometimes caffeine, aged cheeses Relieving factors:  Laying down in dark and quiet room Activity:  Aggravates.  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   Past NSAIDS:  ibuprofen, naproxen Past analgesics:  Excedrin, Tylenol Past abortive triptans:  Maxalt, Zomig, sumatriptan tablet Past abortive ergotamine:  none Past muscle relaxants:  none Past anti-emetic:  Zofran, Promethazine Past antihypertensive medications:  none Past antidepressant/antipsychotic medications:  Nortriptyline, Seroquel Past anticonvulsant medications:  topiramate Past anti-CGRP:  Aimovig 70mg ; Nurtec - both d/c'd due to pregnancy Past vitamins/Herbal/Supplements:  none Past antihistamines/decongestants:  none Other past therapies:  none     Family history of headache:  Mom (migraines later in life)  PAST MEDICAL HISTORY: Past Medical History:  Diagnosis Date   Allergy    Anxiety    Depression    Migraine     MEDICATIONS: Current Outpatient Medications on File Prior to Visit  Medication Sig Dispense Refill   acetaminophen (TYLENOL) 325 MG tablet Take 2 tablets (650 mg total) by mouth every 4 (four) hours as needed (for pain scale < 4). 30 tablet 0   ibuprofen (ADVIL) 600 MG tablet Take 1 tablet (600 mg total) by mouth every 6 (six) hours. 30 tablet 0   NIFEdipine (ADALAT CC) 30 MG 24 hr tablet Take 1 tablet (30 mg total) by mouth daily. 30 tablet 0  Prenatal Vit-Fe Fumarate-FA (PRENATAL VITAMIN) 27-0.8 MG TABS Take 1 tablet by mouth daily. 30 tablet 12   No current facility-administered medications on file prior to visit.    ALLERGIES: No Known Allergies  FAMILY  HISTORY: Family History  Problem Relation Age of Onset   High blood pressure Mother    Mental illness Father        schizophrenia   Esophageal cancer Father    Stroke Maternal Grandmother    Mental illness Maternal Grandmother        bipolar   Kidney cancer Maternal Grandmother    Cancer Maternal Grandfather    Prostate cancer Maternal Grandfather    Colon cancer Maternal Grandfather    Mental illness Paternal Grandmother    Cancer Paternal Grandmother    Kidney cancer Paternal Grandmother    Mental illness Paternal Grandfather       Objective:  Blood pressure 112/70, pulse 75, height 5\' 5"  (1.651 m), weight 201 lb (91.2 kg), SpO2 100 %, not currently breastfeeding. General: No acute distress.  Patient appears well-groomed.   Head:  Normocephalic/atraumatic Eyes:  Fundi examined but not visualized Neck: supple, no paraspinal tenderness, full range of motion Heart:  Regular rate and rhythm Neurological Exam: alert and oriented to person, place, and time.  Speech fluent and not dysarthric, language intact.  CN II-XII intact. Bulk and tone normal, muscle strength 5/5 throughout.  Sensation to light touch intact.  Deep tendon reflexes 2+ throughout.  Finger to nose testing intact.  Gait normal, Romberg negative.   , DO  CC: Tracy Millet, MD

## 2021-11-09 ENCOUNTER — Ambulatory Visit (INDEPENDENT_AMBULATORY_CARE_PROVIDER_SITE_OTHER): Payer: BC Managed Care – PPO | Admitting: Neurology

## 2021-11-09 ENCOUNTER — Encounter: Payer: Self-pay | Admitting: Neurology

## 2021-11-09 VITALS — BP 112/70 | HR 75 | Ht 65.0 in | Wt 201.0 lb

## 2021-11-09 DIAGNOSIS — G43009 Migraine without aura, not intractable, without status migrainosus: Secondary | ICD-10-CM | POA: Diagnosis not present

## 2021-11-09 MED ORDER — NURTEC 75 MG PO TBDP: 1.0000 | ORAL_TABLET | Freq: Every day | ORAL | 5 refills | Status: DC | PRN

## 2021-11-14 ENCOUNTER — Other Ambulatory Visit: Payer: Self-pay | Admitting: Family Medicine

## 2021-11-14 DIAGNOSIS — N719 Inflammatory disease of uterus, unspecified: Secondary | ICD-10-CM

## 2021-11-22 ENCOUNTER — Encounter: Payer: Self-pay | Admitting: Family Medicine

## 2021-11-26 NOTE — BH Specialist Note (Signed)
Integrated Behavioral Health via Telemedicine Visit  12/04/2021 Tracy Cohen [id number]  Number of Integrated Behavioral Health Clinician visits: 2- Second Visit  Session Start time: [phone]   Session End time: 0941  Total time in minutes: 25   Referring Provider: Jaynie Collins, MD Patient/Family location: [facility] Provider location: Center for [facility] at [facility]  All persons participating in visit: [name] and Tracy Cohen   Types of Service: Individual psychotherapy and Video visit  I connected with Tracy Cohen and/or Tracy Cohen's  n/a  via  Telephone or Video Enabled Telemedicine Application  (Video is Caregility application) and verified that I am speaking with the correct person using two identifiers. Discussed confidentiality: Yes   I discussed the limitations of telemedicine and the availability of in person appointments.  Discussed there is a possibility of technology failure and discussed alternative modes of communication if that failure occurs.  I discussed that engaging in this telemedicine visit, they consent to the provision of behavioral healthcare and the services will be billed under their insurance.  Patient and/or legal guardian expressed understanding and consented to Telemedicine visit: Yes   Presenting Concerns: Patient and/or family reports the following symptoms/concerns: Feeling anxious after finding out baby has slight heart murmur, step-dad is in hospital/ICU; preparing to return to work later this week.  Duration of problem: Postpartum; Severity of problem: moderate  Patient and/or Family's Strengths/Protective Factors: Social connections, Concrete supports in place (healthy food, safe environments, etc.), Sense of purpose, and Physical Health (exercise, healthy diet, medication compliance, etc.)  Goals Addressed: Patient will:  Reduce symptoms of: anxiety, depression, and stress    Increase knowledge and/or ability of: stress reduction   Demonstrate ability to: Increase healthy adjustment to current life circumstances  Progress towards Goals: Ongoing  Interventions: Interventions utilized:  Solution-Focused Strategies and Supportive Reflection Standardized Assessments completed: Not Needed  Patient and/or Family Response: Patient agrees with treatment plan.   Assessment: Patient currently experiencing Adjustment disorder with mixed anxious and depressed mood and Psychosocial stress.   Patient may benefit from continued therapeutic interventions.  Plan: Follow up with behavioral health clinician on : Two weeks Behavioral recommendations:  -Continue plan to request work-from-home accomodation at work -Consider doing a "trial run" the day prior to starting back at work (ex. Pack bag, baby ready, drive past workplace, etc.)  Referral(s): Integrated [employer] (In Clinic)  I discussed the assessment and treatment plan with the patient and/or parent/guardian. They were provided an opportunity to ask questions and all were answered. They agreed with the plan and demonstrated an understanding of the instructions.   They were advised to call back or seek an in-person evaluation if the symptoms worsen or if the condition fails to improve as anticipated.  Tracy Close Kivon Aprea, LCSW

## 2021-12-04 ENCOUNTER — Ambulatory Visit (INDEPENDENT_AMBULATORY_CARE_PROVIDER_SITE_OTHER): Payer: BC Managed Care – PPO | Admitting: Clinical

## 2021-12-04 DIAGNOSIS — F4323 Adjustment disorder with mixed anxiety and depressed mood: Secondary | ICD-10-CM | POA: Diagnosis not present

## 2021-12-04 DIAGNOSIS — Z658 Other specified problems related to psychosocial circumstances: Secondary | ICD-10-CM

## 2021-12-04 NOTE — Patient Instructions (Signed)
[facility] at  [facility] [address] [place], [place] [address] [phone] (main office) [phone] (Monroe Toure's office)   
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
## 2021-12-19 ENCOUNTER — Ambulatory Visit (INDEPENDENT_AMBULATORY_CARE_PROVIDER_SITE_OTHER): Payer: Self-pay | Admitting: Clinical

## 2021-12-19 DIAGNOSIS — Z658 Other specified problems related to psychosocial circumstances: Secondary | ICD-10-CM

## 2021-12-19 DIAGNOSIS — F4323 Adjustment disorder with mixed anxiety and depressed mood: Secondary | ICD-10-CM

## 2021-12-19 NOTE — Patient Instructions (Signed)
Center for [facility] Healthcare at [facility] [address] [place], [place] [address] [phone] (main office) [phone] (Kyleigha Markert's office)  [place] Child Counsellor  (Childcare options, Early childcare development, etc.) [email or web address]  [email or web address]

## 2022-02-28 ENCOUNTER — Other Ambulatory Visit: Payer: Self-pay | Admitting: Family Medicine

## 2022-02-28 DIAGNOSIS — N393 Stress incontinence (female) (male): Secondary | ICD-10-CM | POA: Insufficient documentation

## 2022-02-28 NOTE — Progress Notes (Signed)
Patient disclosed at [facility] that she is having daily urine leakage. Def with coughing and sneezing and but also constant.. Wearing a pad daily and filling  Referred to PT at [facility]

## 2022-03-11 ENCOUNTER — Encounter: Payer: Self-pay | Admitting: Family Medicine

## 2022-03-12 NOTE — Addendum Note (Signed)
Addended by: Georgia Lopes on: 03/12/2022 08:35 AM   Modules accepted: Orders

## 2022-04-03 NOTE — Therapy (Signed)
OUTPATIENT PHYSICAL THERAPY FEMALE PELVIC EVALUATION   Patient Name: Tracy Cohen MRN: [id number] DOB:[birth year], 31 y.o., female Today's Date: 04/04/2022  END OF SESSION:  PT End of Session - 04/04/22 1349     Visit Number 1    Date for PT Re-Evaluation 05/30/22    Authorization Type BCBS    PT Start Time 1300    PT Stop Time 1349    PT Time Calculation (min) 49 min    Activity Tolerance Patient tolerated treatment well    Behavior During Therapy [facility] for tasks assessed/performed             Past Medical History:  Diagnosis Date   Allergy    Anxiety    Depression    Migraine    Past Surgical History:  Procedure Laterality Date   ADENOIDECTOMY     TONSILLECTOMY     Patient Active Problem List   Diagnosis Date Noted   Stress incontinence of urine 02/28/2022   Nexplanon insertion 09/28/2021   Postpartum depression 09/13/2021   Postpartum endometritis 08/29/2021   Gestational hypertension 07/18/2021   Atopic dermatitis 10/13/2020   Gastroesophageal reflux disease 07/07/2018   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   GAD (generalized anxiety disorder) 12/13/2013   Migraine 04/23/2013    PCP: Maury Dus, MD  REFERRING PROVIDER: Federico Flake, MD   REFERRING DIAG: N39.3 (ICD-10-CM) - Stress incontinence of urine   THERAPY DIAG:  Muscle weakness (generalized)  Other lack of coordination  Rationale for Evaluation and Treatment: Rehabilitation  ONSET DATE: 01/2022  SUBJECTIVE:                                                                                                                                                                                           SUBJECTIVE STATEMENT: Noticed urinary leakage when underwear was soiled.  Fluid intake: Yes: water, cranberry juice, apple juice, ginger ale    PAIN:  Are you having pain? No  PRECAUTIONS: None  WEIGHT BEARING RESTRICTIONS: No  FALLS:  Has patient fallen in last 6  months? No  LIVING ENVIRONMENT: Lives with: lives with their family  OCCUPATION: collections, sitting  PLOF: Independent  PATIENT GOALS: control of urination  PERTINENT HISTORY:  None Sexual abuse: No  BOWEL MOVEMENT: Pain with bowel movement: No URINATION: Pain with urination: No Fully empty bladder: Yes:   Stream: Strong, in the past had a cyst on her urethra Urgency: Yes:   Frequency: 3 times in 2 hours Leakage: Coughing, Sneezing, and does not always feel the leakage Pads: Yes: extra long liner  INTERCOURSE: Pain with intercourse:  none  PREGNANCY:  Vaginal deliveries 1, 08/29/2021 Tearing Yes: labia tear and left side was stitched Currently pregnant No  PROLAPSE: None   OBJECTIVE:   COGNITION: Overall cognitive status: Within functional limits for tasks assessed     SENSATION: Light touch: Appears intact Proprioception: Appears intact   POSTURE: rounded shoulders, forward head, and increased valgus of knees  PELVIC ALIGNMENT: Pelvis in correct alignment  LUMBARAROM/PROM:  A/PROM A/PROM  eval  Flexion full  Extension full  Right lateral flexion Decreased by 25% with endrange pain  Left lateral flexion full  Right rotation Decreased by 25%  Left rotation Decreased by 25%   (Blank rows = not tested)  LOWER EXTREMITY ROM: bilateral hip ROM is full   LOWER EXTREMITY MMT:  MMT Right eval Left eval  Hip extension 3+/5, difficult to contract gluteals 4/5  Hip abduction 3+/5 3+/5  Hip adduction 4/5 4/5   PALPATION:   General  abdominal contraction will lift the lower rib cage and not engage the lower abdomen; tenderness located in bilateral quadratus, decreased movement of L3-L5, T11-L3 rotated left                External Perineal Exam area where she tore on the left upper labia had good mobility                             Internal Pelvic Floor tightness along the urogenital diaphragm, ischiocavernosus, and sides of introitus  Patient  confirms identification and approves PT to assess internal pelvic floor and treatment Yes  PELVIC MMT:   MMT eval  Vaginal Initially was 2/5 then after manual and tactile cues increased to 3/5  Diastasis Recti none  (Blank rows = not tested)        TONE: good  PROLAPSE: none  TODAY'S TREATMENT:                                                                                                                              DATE: 04/04/2022  EVAL See below   PATIENT EDUCATION:  Education details: Access Code: [email or web address] Person educated: Patient Education method: Explanation, Demonstration, Tactile cues, Verbal cues, and Handouts Education comprehension: verbalized understanding, returned demonstration, verbal cues required, tactile cues required, and needs further education  HOME EXERCISE PROGRAM: Access Code: [email or web address] URL: [email or web address] Date: 04/04/2022 Prepared by: Eulis Foster  Exercises - Seated Pelvic Floor Contraction  - 3 x daily - 7 x weekly - 1 sets - 10 reps - 5 sec hold  ASSESSMENT:  CLINICAL IMPRESSION: Patient is a 31 y.o. female who was seen today for physical therapy evaluation and treatment for stress incontinence. Patient started to leak urine suddenly 12/2021. Patient had her daughter vaginally on 08/29/2021 and tore in the labia. Patient would lift her lower rib cage to contract her abdominals and not engage her lower abdominals well. Pelvic floor strength was initially 2/5 and after manual and tactile  cues she was able to contraction with a circular contraction and lift. She has weakness in bilateral hip abductors, adductors and extensors. Her right hip strength increased to 4/5 after manual work to the lumbar area. She has decreased movement of L3-L5 and T11-L3 rotated left. She has trigger points in bilateral quadratus. Patient will leak urine with coughing and sneezing and sometimes is not aware of her leakage. She wears a long pad during the  day. Patient will benefit from skilled therapy to improve pelvic floor strength and awareness to reduce urinary leakage.   OBJECTIVE IMPAIRMENTS: decreased endurance and decreased strength.   ACTIVITY LIMITATIONS: continence  PARTICIPATION LIMITATIONS: community activity  PERSONAL FACTORS: Fitness are also affecting patient's functional outcome.   REHAB POTENTIAL: Excellent  CLINICAL DECISION MAKING: Stable/uncomplicated  EVALUATION COMPLEXITY: Low   GOALS: Goals reviewed with patient? Yes  SHORT TERM GOALS: Target date: 05/02/2022  1.  Patient independent with pelvic floor exercises to reduce leakage.  Baseline:  Goal status: INITIAL   2.  Patient able to contract her abdominals without lifting her lower rib cage.  Baseline:  Goal status: INITIAL   LONG TERM GOALS: Target date: 05/30/2021  Patient independent with core and pelvic floor exercises to reduce her leakage.  Baseline:  Goal status: INITIAL  2.  Patient has awareness of her pelvic floor and feel the contraction.  Baseline:  Goal status: INITIAL  3.  Patient is able to sneeze or laugh without urinary leakage due to increased in pelvic floor strength >/= 4/5.  Baseline:  Goal status: INITIAL  4.  Patient is not wearing pads due to no urinary leakage and improved pelvic floor coordination.  Baseline:  Goal status: INITIAL   PLAN:  PT FREQUENCY: 1x/week  PT DURATION: 8 weeks  PLANNED INTERVENTIONS: Therapeutic exercises, Therapeutic activity, Neuromuscular re-education, Balance training, Self Care, Joint mobilization, Dry Needling, Spinal mobilization, Biofeedback, and Manual therapy  PLAN FOR NEXT SESSION: Assess back mobility, core engagement with pelvic floor contraction, hip strength   Eulis Foster, PT 04/04/22 2:32 PM

## 2022-04-04 ENCOUNTER — Encounter: Payer: BC Managed Care – PPO | Attending: Family Medicine | Admitting: Physical Therapy

## 2022-04-04 ENCOUNTER — Other Ambulatory Visit: Payer: Self-pay

## 2022-04-04 ENCOUNTER — Encounter: Payer: Self-pay | Admitting: Physical Therapy

## 2022-04-04 DIAGNOSIS — M6281 Muscle weakness (generalized): Secondary | ICD-10-CM | POA: Diagnosis not present

## 2022-04-04 DIAGNOSIS — R278 Other lack of coordination: Secondary | ICD-10-CM | POA: Diagnosis not present

## 2022-04-04 DIAGNOSIS — N393 Stress incontinence (female) (male): Secondary | ICD-10-CM | POA: Insufficient documentation

## 2022-04-09 ENCOUNTER — Encounter: Payer: Self-pay | Admitting: Physical Therapy

## 2022-04-09 ENCOUNTER — Encounter: Payer: BC Managed Care – PPO | Attending: Family Medicine | Admitting: Physical Therapy

## 2022-04-09 DIAGNOSIS — M6281 Muscle weakness (generalized): Secondary | ICD-10-CM | POA: Insufficient documentation

## 2022-04-09 DIAGNOSIS — R278 Other lack of coordination: Secondary | ICD-10-CM | POA: Insufficient documentation

## 2022-04-09 NOTE — Therapy (Addendum)
OUTPATIENT PHYSICAL THERAPY TREATMENT NOTE   Patient Name: Tracy Cohen MRN: [id number] DOB:10-Mar-1991, 31 y.o., female Today's Date: 04/09/2022  PCP: Alcus Dad, MD  REFERRING PROVIDER: Caren Macadam, MD    END OF SESSION:   PT End of Session - 04/09/22 1304     Visit Number 2    Date for PT Re-Evaluation 05/30/22    Authorization Type BCBS    PT Start Time 1300    PT Stop Time 1345    PT Time Calculation (min) 45 min    Activity Tolerance Patient tolerated treatment well    Behavior During Therapy [facility] for tasks assessed/performed             Past Medical History:  Diagnosis Date   Allergy    Anxiety    Depression    Migraine    Past Surgical History:  Procedure Laterality Date   ADENOIDECTOMY     TONSILLECTOMY     Patient Active Problem List   Diagnosis Date Noted   Stress incontinence of urine 02/28/2022   Nexplanon insertion 09/28/2021   Postpartum depression 09/13/2021   Postpartum endometritis 08/29/2021   Gestational hypertension 07/18/2021   Atopic dermatitis 10/13/2020   Gastroesophageal reflux disease 07/07/2018   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   GAD (generalized anxiety disorder) 12/13/2013   Migraine 04/23/2013   REFERRING DIAG: N39.3 (ICD-10-CM) - Stress incontinence of urine    THERAPY DIAG:  Muscle weakness (generalized)   Other lack of coordination   Rationale for Evaluation and Treatment: Rehabilitation   ONSET DATE: 01/2022   SUBJECTIVE:                                                                                                                                                                                            SUBJECTIVE STATEMENT: My back felt better after treatment then started to hurt again. The urinary leakage is better since the last visit.    PAIN:  Are you having pain? No   PRECAUTIONS: None   WEIGHT BEARING RESTRICTIONS: No   FALLS:  Has patient fallen in last 6 months?  No   LIVING ENVIRONMENT: Lives with: lives with their family   OCCUPATION: collections, sitting   PLOF: Independent   PATIENT GOALS: control of urination   PERTINENT HISTORY:  None Sexual abuse: No   BOWEL MOVEMENT: Pain with bowel movement: No URINATION: Pain with urination: No Fully empty bladder: Yes:   Stream: Strong, in the past had a cyst on her urethra Urgency: Yes:   Frequency: 3 times in 2 hours Leakage: Coughing, Sneezing, and does not always  feel the leakage Pads: Yes: extra long liner   INTERCOURSE: Pain with intercourse:  none   PREGNANCY: Vaginal deliveries 1, 08/29/2021 Tearing Yes: labia tear and left side was stitched Currently pregnant No   PROLAPSE: None     OBJECTIVE:    COGNITION: Overall cognitive status: Within functional limits for tasks assessed                          SENSATION: Light touch: Appears intact Proprioception: Appears intact     POSTURE: rounded shoulders, forward head, and increased valgus of knees   PELVIC ALIGNMENT: Pelvis in correct alignment   LUMBARAROM/PROM:   A/PROM A/PROM  eval  Flexion full  Extension full  Right lateral flexion Decreased by 25% with endrange pain  Left lateral flexion full  Right rotation Decreased by 25%  Left rotation Decreased by 25%   (Blank rows = not tested)   LOWER EXTREMITY ROM: bilateral hip ROM is full     LOWER EXTREMITY MMT:   MMT Right eval Left eval  Hip extension 3+/5, difficult to contract gluteals 4/5  Hip abduction 3+/5 3+/5  Hip adduction 4/5 4/5    PALPATION:   General  abdominal contraction will lift the lower rib cage and not engage the lower abdomen; tenderness located in bilateral quadratus, decreased movement of L3-L5, T11-L3 rotated left                 External Perineal Exam area where she tore on the left upper labia had good mobility                             Internal Pelvic Floor tightness along the urogenital diaphragm, ischiocavernosus,  and sides of introitus   Patient confirms identification and approves PT to assess internal pelvic floor and treatment Yes   PELVIC MMT:   MMT eval  Vaginal Initially was 2/5 then after manual and tactile cues increased to 3/5  Diastasis Recti none  (Blank rows = not tested)         TONE: good   PROLAPSE: none   TODAY'S TREATMENT:    04/08/2022 Manual: Soft tissue mobilization: Along the diaphragm, lower abdomen, along the lateral intercoastals from rib 7-12 Spinal mobilization: PA and rotational mobilization to T5-T12 grade 3 Lower rib mobilization to reduce the upward position and come downward with abdominal contraction Neuromuscular re-education: Core retraining: Core facilitation: Diaphragmatic breathing with bringing the ribs downward Abdominal contraction including the upper and lower rib cage and ball squeeze using tactile cues Exercises: Stretches/mobility: Prone lift elbow off mat to increase rotation of thoracic Sitting trunk rotation bil. 15 sec 2x each side Seated piriformis stretch holding for 30 sec  bil.  Strengthening: Prone W, T, and Y scapula retraction 10x each      PATIENT EDUCATION:  04/08/2022 Education details: Access Code: [email or web address] Person educated: Patient Education method: Explanation, Demonstration, Tactile cues, Verbal cues, and Handouts Education comprehension: verbalized understanding, returned demonstration, verbal cues required, tactile cues required, and needs further education   HOME EXERCISE PROGRAM: 04/08/2022  Access Code: [email or web address] URL: [email or web address] Date: 04/09/2022 Prepared by: Earlie Counts  Exercises - Seated Pelvic Floor Contraction  - 3 x daily - 7 x weekly - 1 sets - 10 reps - 5 sec hold - Prone on Elbows Thoracic Rotation  - 1 x daily - 3 x weekly - 1 sets - 5 reps - Prone  Scapular Retraction Y  - 1 x daily - 2 x weekly - 1 sets - 10 reps - Prone Scapular Retraction Arms at Side  - 1 x daily - 2 x  weekly - 1 sets - 10 reps - Prone W Scapular Retraction  - 1 x daily - 2 x weekly - 1 sets - 10 reps - Seated Trunk Rotation Stretch  - 1 x daily - 3 x weekly - 1 sets - 2 reps - Seated Piriformis Stretch with Trunk Bend  - 1 x daily - 3 x weekly - 1 sets - 2 reps - 30 sec hold - Hooklying Transversus Abdominis Palpation  - 1 x daily - 7 x weekly - 1 sets - 10 reps ASSESSMENT:   CLINICAL IMPRESSION: Patient is a 31 y.o. female who was seen today for physical therapy  treatment for stress incontinence. Patient feels her urinary leakage is better. She had tightness in her lower rib cage making it difficult to come downward with abdominal contraction. She had difficulty with expanding her lower rib cage with diaphragmatic breathing.  Patient felt taller after therapy and she is able to breath better due to improve mobility. Patient will benefit from skilled therapy to improve pelvic floor strength and awareness to reduce urinary leakage.    OBJECTIVE IMPAIRMENTS: decreased endurance and decreased strength.    ACTIVITY LIMITATIONS: continence   PARTICIPATION LIMITATIONS: community activity   PERSONAL FACTORS: Fitness are also affecting patient's functional outcome.    REHAB POTENTIAL: Excellent   CLINICAL DECISION MAKING: Stable/uncomplicated   EVALUATION COMPLEXITY: Low     GOALS: Goals reviewed with patient? Yes   SHORT TERM GOALS: Target date: 05/02/2022   1.  Patient independent with pelvic floor exercises to reduce leakage.  Baseline:  Goal status: Met 04/08/2022     2.  Patient able to contract her abdominals without lifting her lower rib cage.  Baseline:  Goal status: INITIAL     LONG TERM GOALS: Target date: 05/30/2021   Patient independent with core and pelvic floor exercises to reduce her leakage.  Baseline:  Goal status: INITIAL   2.  Patient has awareness of her pelvic floor and feel the contraction.  Baseline:  Goal status: INITIAL   3.  Patient is able to  sneeze or laugh without urinary leakage due to increased in pelvic floor strength >/= 4/5.  Baseline:  Goal status: INITIAL   4.  Patient is not wearing pads due to no urinary leakage and improved pelvic floor coordination.  Baseline:  Goal status: INITIAL     PLAN:   PT FREQUENCY: 1x/week   PT DURATION: 8 weeks   PLANNED INTERVENTIONS: Therapeutic exercises, Therapeutic activity, Neuromuscular re-education, Balance training, Self Care, Joint mobilization, Dry Needling, Spinal mobilization, Biofeedback, and Manual therapy   PLAN FOR NEXT SESSION: core engagement with pelvic floor contraction, hip strength, quick flicks and elevator  Earlie Counts, PT 04/09/22 1:58 PM   PHYSICAL THERAPY DISCHARGE SUMMARY  Visits from Start of Care: 2  Current functional level related to goals / functional outcomes: See above. Called patient on 05/29/22 and patient wanted to be discharged.    Remaining deficits: See above.    Education / Equipment: HEP   Patient agrees to discharge. Patient goals were not met. Patient is being discharged due to the patient's request. Thank you for the referral. Earlie Counts, PT 05/29/22 1:06 PM

## 2022-04-10 NOTE — Progress Notes (Deleted)
NEUROLOGY FOLLOW UP OFFICE NOTE  Tracy Cohen [id number]  Assessment/Plan:   Migraine without aura, without status migrainosus, not intractable   1.  *** 2.  Refilled Nurtec as needed 3.  Follow up in 5 months.   Subjective:  Tracy Cohen is a 31 year old female pregnant at [redacted] weeks gestation who follows up for migraines.[name]   UPDATE: *** Frequency of abortive medication: 4 days a month Current NSAIDS: none Current analgesics:  Tylenol Current triptans:  none Current ergotamine:  none Current anti-emetic:  none Current muscle relaxants:  none Current anti-anxiolytic:  none Current sleep aide:  none Current Antihypertensive medications:  none Current Antidepressant/antipsychotic none Current Anticonvulsant medications:  none Current anti-CGRP:  prenatal Current Vitamins/Herbal/Supplements:  none Current Antihistamines/Decongestants:  none Other therapy:  none Hormone/birth control:  none   Caffeine:  No Alcohol:  no Smoker:  no Diet:  Water, protein, vegetables Exercise:  Yes.  Has a trainer. Depression:  yes; Anxiety:  yes Other pain:  no Sleep hygiene:  ok     HISTORY:  Onset:  Occasionally from age 17 through teenager.  Became more frequent in her mid 23s. Location:  Start bifrontal/temporal Quality:  Pounding and squeezing Intensity:  10/10.  She denies new headache, thunderclap headache  Aura:  no Premonitory Phase: no Postdrome: no Associated symptoms:  Dizziness, lightheadedness, nausea, vomiting, photophobia, phonophobia, osmophobia, vision loss (blurred vision to black), numbness in legs.  She denies associated unilateral weakness. Duration:  Usually 48 to 72 hours.  Anywhere from 1 1/2 days up to 3 months Frequency:  2 to 3 times a month (10 days a month) Frequency of abortive medication: 10 days a month Triggers:  Anything with red dye, sometimes caffeine, aged cheeses Relieving factors:  Laying down in dark and quiet room Activity:  Aggravates.   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   Past NSAIDS:  ibuprofen, naproxen Past analgesics:  Excedrin, Tylenol Past abortive triptans:  Maxalt, Zomig, sumatriptan tablet Past abortive ergotamine:  none Past muscle relaxants:  none Past anti-emetic:  Zofran, Promethazine Past antihypertensive medications:  none Past antidepressant/antipsychotic medications:  Nortriptyline, Seroquel Past anticonvulsant medications:  topiramate Past anti-CGRP:  Aimovig 70mg ; Nurtec - both d/c'd due to pregnancy Past vitamins/Herbal/Supplements:  none Past antihistamines/decongestants:  none Other past therapies:  none     Family history of headache:  Mom (migraines later in life)  PAST MEDICAL HISTORY: Past Medical History:  Diagnosis Date   Allergy    Anxiety    Depression    Migraine     MEDICATIONS: Current Outpatient Medications on File Prior to Visit  Medication Sig Dispense Refill   Prenatal Vit-Fe Fumarate-FA (PRENATAL VITAMIN) 27-0.8 MG TABS Take 1 tablet by mouth daily. (Patient not taking: Reported on 04/04/2022) 30 tablet 12   Rimegepant Sulfate (NURTEC) 75 MG TBDP Take 1 tablet by mouth daily as needed (Maximum 1 tablet in 24 hours). 8 tablet 5   No current facility-administered medications on file prior to visit.    ALLERGIES: No Known Allergies  FAMILY HISTORY: Family History  Problem Relation Age of Onset   High blood pressure Mother    Mental illness Father        schizophrenia   Esophageal cancer Father    Stroke Maternal Grandmother    Mental illness Maternal Grandmother  bipolar   Kidney cancer Maternal Grandmother    Cancer Maternal Grandfather    Prostate cancer Maternal Grandfather    Colon cancer Maternal Grandfather    Mental illness Paternal Grandmother    Cancer Paternal  Grandmother    Kidney cancer Paternal Grandmother    Mental illness Paternal Grandfather       Objective:  *** General: No acute distress.  Patient appears well-groomed.   Head:  Normocephalic/atraumatic Eyes:  Fundi examined but not visualized Neck: supple, no paraspinal tenderness, full range of motion Heart:  Regular rate and rhythm Neurological Exam: ***   Shon Millet, DO  CC: Maury Dus, MD

## 2022-04-15 ENCOUNTER — Ambulatory Visit: Payer: BC Managed Care – PPO | Admitting: Neurology

## 2022-04-18 ENCOUNTER — Encounter: Payer: BC Managed Care – PPO | Admitting: Physical Therapy

## 2022-04-25 ENCOUNTER — Encounter: Payer: BC Managed Care – PPO | Admitting: Physical Therapy

## 2022-04-25 NOTE — Progress Notes (Unsigned)
NEUROLOGY FOLLOW UP OFFICE NOTE  Tracy Cohen [id number]  Assessment/Plan:   Migraine without aura, without status migrainosus, not intractable   1.  Start Ajovy every 28 days.  I suspect Aimovig is not preferred this coming year 2.  Refilled Nurtec as needed.  Zofran for nausea 3.  Follow up in 5 months.   Subjective:  Tracy Cohen is a 31 year old female pregnant at [redacted] weeks gestation who follows up for migraines.[name]   UPDATE: No longer breastfeeding.   Intensity:  Moderate. 2 severe since last visit. Duration:  Moderate lasts 2 hours.  Severe lasts 2-3 days.  Nurtec didn't help Frequency:  2-3 a month. Current NSAIDS: none Current analgesics:  Tylenol Current triptans:  none Current ergotamine:  none Current anti-emetic:  none Current muscle relaxants:  none Current anti-anxiolytic:  none Current sleep aide:  none Current Antihypertensive medications:  none Current Antidepressant/antipsychotic none Current Anticonvulsant medications:  none Current anti-CGRP:  prenatal Current Vitamins/Herbal/Supplements:  none Current Antihistamines/Decongestants:  none Other therapy:  none Hormone/birth control:  none   Caffeine:  No Alcohol:  no Smoker:  no Diet:  Water, protein, vegetables Exercise:  Yes.  Has a trainer. Depression:  yes; Anxiety:  yes Other pain:  no Sleep hygiene:  ok     HISTORY:  Onset:  Occasionally from age 61 through teenager.  Became more frequent in her mid 64s. Location:  Start bifrontal/temporal Quality:  Pounding and squeezing Intensity:  10/10.  She denies new headache, thunderclap headache  Aura:  no Premonitory Phase: no Postdrome: no Associated symptoms:  Dizziness, lightheadedness, nausea, vomiting, photophobia, phonophobia, osmophobia, vision loss (blurred vision to black), numbness in legs.  She denies associated unilateral weakness. Duration:  Usually 48 to 72 hours.  Anywhere from 1 1/2 days up to 3 months Frequency:  2 to 3 times  a month (10 days a month) Frequency of abortive medication: 10 days a month Triggers:  Anything with red dye, sometimes caffeine, aged cheeses Relieving factors:  Laying down in dark and quiet room Activity:  Aggravates.  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   Past NSAIDS:  ibuprofen, naproxen Past analgesics:  Excedrin, Tylenol Past abortive triptans:  Maxalt, Zomig, sumatriptan tablet Past abortive ergotamine:  none Past muscle relaxants:  none Past anti-emetic:  Zofran, Promethazine Past antihypertensive medications:  none Past antidepressant/antipsychotic medications:  Nortriptyline, Seroquel Past anticonvulsant medications:  topiramate Past anti-CGRP:  Aimovig 70mg ; Nurtec  Past vitamins/Herbal/Supplements:  none Past antihistamines/decongestants:  none Other past therapies:  none     Family history of headache:  Mom (migraines later in life)  PAST MEDICAL HISTORY: Past Medical History:  Diagnosis Date   Allergy    Anxiety    Depression    Migraine     MEDICATIONS: Current Outpatient Medications on File Prior to Visit  Medication Sig Dispense Refill   Prenatal Vit-Fe Fumarate-FA (PRENATAL VITAMIN) 27-0.8 MG TABS Take 1 tablet by mouth daily. (Patient not taking: Reported on 04/04/2022) 30 tablet 12   Rimegepant Sulfate (NURTEC) 75 MG TBDP Take 1 tablet by mouth daily as needed (Maximum 1 tablet in 24 hours). 8 tablet 5   No current facility-administered medications on file prior to visit.    ALLERGIES: No Known Allergies  FAMILY HISTORY: Family History  Problem Relation Age of Onset  High blood pressure Mother    Mental illness Father        schizophrenia   Esophageal cancer Father    Stroke Maternal Grandmother    Mental illness Maternal Grandmother        bipolar   Kidney  cancer Maternal Grandmother    Cancer Maternal Grandfather    Prostate cancer Maternal Grandfather    Colon cancer Maternal Grandfather    Mental illness Paternal Grandmother    Cancer Paternal Grandmother    Kidney cancer Paternal Grandmother    Mental illness Paternal Grandfather       Objective:  Blood pressure 115/68, pulse 77, height 5\' 5"  (1.651 m), weight 199 lb 12.8 oz (90.6 kg), SpO2 98 %, not currently breastfeeding. General: No acute distress.  Patient appears well-groomed.      , DO  CC: Shon Millet, MD

## 2022-04-26 ENCOUNTER — Ambulatory Visit: Payer: BC Managed Care – PPO | Admitting: Neurology

## 2022-04-26 VITALS — BP 115/68 | HR 77 | Ht 65.0 in | Wt 199.8 lb

## 2022-04-26 DIAGNOSIS — G43009 Migraine without aura, not intractable, without status migrainosus: Secondary | ICD-10-CM

## 2022-04-26 MED ORDER — SUMATRIPTAN 20 MG/ACT NA SOLN: 20.0000 mg | NASAL | 5 refills | Status: DC | PRN

## 2022-04-26 MED ORDER — ONDANSETRON 4 MG PO TBDP: 4.0000 mg | ORAL_TABLET | Freq: Three times a day (TID) | ORAL | 5 refills | Status: DC | PRN

## 2022-04-26 MED ORDER — AJOVY 225 MG/1.5ML ~~LOC~~ SOAJ: 225.0000 mg | SUBCUTANEOUS | 11 refills | Status: DC

## 2022-04-26 MED ORDER — NURTEC 75 MG PO TBDP: 1.0000 | ORAL_TABLET | Freq: Every day | ORAL | 11 refills | Status: DC | PRN

## 2022-04-26 NOTE — Patient Instructions (Addendum)
Start Ajovy injection every 28 days Take Nurtec once as needed for migraine attacks.  If it is severe and not aborted in 2 hours, take sumatriptan nasal spray as directed Zofran (ondansetron) for nausea Limit use of pain relievers to no more than 2 days out of week to prevent risk of rebound or medication-overuse headache. Keep headache diary Follow up 4-5 months.

## 2022-04-30 ENCOUNTER — Encounter: Payer: Self-pay | Admitting: Neurology

## 2022-05-01 NOTE — BH Specialist Note (Unsigned)
Integrated Behavioral Health via Telemedicine Visit  05/02/2022 Tracy Cohen [id number]  Number of Integrated Behavioral Health Clinician visits: 4- Fourth Visit  Session Start time: [phone]   Session End time: 1002  Total time in minutes: 13 *Video was cut off on pt side at 13 minutes; made three attempts to call back; resent video invite  Referring Provider: Jaynie Collins, MD Patient/Family location: Home [facility] Provider location: [facility]  All persons participating in visit: [name] and [facility] Tracy Cohen   Types of Service: Individual psychotherapy and Video visit  I connected with Tracy Cohen and/or Tracy Cohen's  n/a  via  Telephone or Video Enabled Telemedicine Application  (Video is Caregility application) and verified that I am speaking with the correct person using two identifiers. Discussed confidentiality: Yes   I discussed the limitations of telemedicine and the availability of in person appointments.  Discussed there is a possibility of technology failure and discussed alternative modes of communication if that failure occurs.  I discussed that engaging in this telemedicine visit, they consent to the provision of behavioral healthcare and the services will be billed under their insurance.  Patient and/or legal guardian expressed understanding and consented to Telemedicine visit: Yes   Presenting Concerns: Patient and/or family reports the following symptoms/concerns: Job stress (not getting appropriate raises as other coworkers are) and ongoing childcare problems (still on childcare voucher waitlist);  seeking better pay opportunities with no offers to date; feeling "tapped out" and discouraged.  Duration of problem: Increasing stress over time as problems persist; Severity of problem: moderate  Patient and/or Family's Strengths/Protective Factors: Concrete supports in place (healthy food, safe  environments, etc.) and Sense of purpose  Goals Addressed: Patient will:  Reduce symptoms of: anxiety, depression, and stress   Increase knowledge and/or ability of: stress reduction   Demonstrate ability to: Increase healthy adjustment to current life circumstances and Increase motivation to adhere to plan of care  Progress towards Goals: Ongoing  Interventions: Interventions utilized:  Solution-Focused Strategies and Supportive Reflection Standardized Assessments completed:  not given  Patient and/or Family Response: Patient agrees with treatment plan.   Assessment: Patient currently experiencing Adjustment disorder with mixed anxious and depressed mood and Psychosocial stress.   Patient may benefit from psychoeducation and brief therapeutic interventions regarding coping with symptoms of anxiety, depression, life stress .  Plan: Follow up with behavioral health clinician on : Call Tracy Cohen at [phone] to schedule follow up Behavioral recommendations:  -Continue required calls to stay on childcare voucher list -Continue applying for better-pay positions -Consider ways to gain new skills after discussing with employer options for promotions and requirements for raises Referral(s): Integrated [employer] (In Clinic)  I discussed the assessment and treatment plan with the patient and/or parent/guardian. They were provided an opportunity to ask questions and all were answered. They agreed with the plan and demonstrated an understanding of the instructions.   They were advised to call back or seek an in-person evaluation if the symptoms worsen or if the condition fails to improve as anticipated.  Valetta Close Tracy Bencosme, LCSW

## 2022-05-02 ENCOUNTER — Ambulatory Visit: Payer: BC Managed Care – PPO | Admitting: Clinical

## 2022-05-02 DIAGNOSIS — Z658 Other specified problems related to psychosocial circumstances: Secondary | ICD-10-CM

## 2022-05-02 DIAGNOSIS — F4323 Adjustment disorder with mixed anxiety and depressed mood: Secondary | ICD-10-CM

## 2022-05-02 NOTE — Patient Instructions (Signed)
[facility] [address] [place], [place] [address] [phone] (main office) [phone] (Kloi Brodman's office)   

## 2022-05-03 ENCOUNTER — Telehealth: Payer: Self-pay | Admitting: Clinical

## 2022-05-03 NOTE — Telephone Encounter (Signed)
Attempt call to reschedule after video cut off during yesterday's visit; Left HIPPA-compliant message to call back Asher Muir from [facility] [employer] [facility] at  [phone] (Jadaya Sommerfield's office) or front desk at [phone].

## 2022-05-07 ENCOUNTER — Encounter: Payer: BC Managed Care – PPO | Admitting: Physical Therapy

## 2022-05-14 ENCOUNTER — Encounter: Payer: BC Managed Care – PPO | Admitting: Physical Therapy

## 2022-05-21 ENCOUNTER — Encounter: Payer: BC Managed Care – PPO | Admitting: Physical Therapy

## 2022-05-30 ENCOUNTER — Encounter: Payer: BC Managed Care – PPO | Admitting: Physical Therapy

## 2022-06-06 ENCOUNTER — Telehealth: Payer: Self-pay

## 2022-06-06 NOTE — Telephone Encounter (Signed)
PA renewal request received via CMM for Nurtec 75MG  dispersible tablets  PA has been submitted to Collins and is pending determination   Key: [id number]

## 2022-06-07 NOTE — Telephone Encounter (Signed)
PA has been APPROVED from 06/06/2022-06/06/2023  Approval letter has been attached in patient documents.  

## 2022-06-26 ENCOUNTER — Ambulatory Visit: Payer: BC Managed Care – PPO | Admitting: Family Medicine

## 2022-06-26 ENCOUNTER — Encounter: Payer: Self-pay | Admitting: Family Medicine

## 2022-06-26 VITALS — BP 102/66 | HR 74 | Temp 98.1°F | Resp 16 | Ht 65.0 in | Wt 192.0 lb

## 2022-06-26 DIAGNOSIS — F32A Depression, unspecified: Secondary | ICD-10-CM

## 2022-06-26 DIAGNOSIS — G43009 Migraine without aura, not intractable, without status migrainosus: Secondary | ICD-10-CM

## 2022-06-26 DIAGNOSIS — Z7689 Persons encountering health services in other specified circumstances: Secondary | ICD-10-CM

## 2022-06-26 MED ORDER — SERTRALINE HCL 25 MG PO TABS: 25.0000 mg | ORAL_TABLET | Freq: Every day | ORAL | 1 refills | Status: DC

## 2022-06-27 ENCOUNTER — Encounter: Payer: Self-pay | Admitting: Family Medicine

## 2022-06-27 NOTE — Progress Notes (Signed)
New Patient Office Visit  Subjective    Patient ID: Tracy Cohen, female    DOB: 12-Dec-1990  Age: 32 y.o. MRN: [id number]  CC:  Chief Complaint  Patient presents with   Establish Care    HPI Tracy Cohen presents to establish care and for review of chronic med issues.    Outpatient Encounter Medications as of 06/26/2022  Medication Sig   Fremanezumab-vfrm (AJOVY) 225 MG/1.5ML SOAJ Inject 225 mg into the skin every 28 (twenty-eight) days.   ondansetron (ZOFRAN-ODT) 4 MG disintegrating tablet Take 1 tablet (4 mg total) by mouth every 8 (eight) hours as needed for nausea or vomiting.   Rimegepant Sulfate (NURTEC) 75 MG TBDP Take 1 tablet by mouth daily as needed (Maximum 1 tablet in 24 hours).   sertraline (ZOLOFT) 25 MG tablet Take 1 tablet (25 mg total) by mouth daily.   SUMAtriptan (IMITREX) 20 MG/ACT nasal spray Place 1 spray (20 mg total) into the nose as needed for migraine or headache. May repeat in 2 hours if headache persists or recurs.  Maximum 2 sprays in 24 hours.   No facility-administered encounter medications on file as of 06/26/2022.    Past Medical History:  Diagnosis Date   Allergy    Anxiety    Depression    Migraine     Past Surgical History:  Procedure Laterality Date   ADENOIDECTOMY     TONSILLECTOMY      Family History  Problem Relation Age of Onset   High blood pressure Mother    Mental illness Father        schizophrenia   Esophageal cancer Father    Stroke Maternal Grandmother    Mental illness Maternal Grandmother        bipolar   Kidney cancer Maternal Grandmother    Cancer Maternal Grandfather    Prostate cancer Maternal Grandfather    Colon cancer Maternal Grandfather    Mental illness Paternal Grandmother    Cancer Paternal Grandmother    Kidney cancer Paternal Grandmother    Mental illness Paternal Grandfather     Social History   Socioeconomic History   Marital status: Single    Spouse name: Not on file   Number of  children: 0   Years of education: 13   Highest education level: Associate degree: occupational, Hotel manager, or vocational program  Occupational History    Employer: [employer]  Tobacco Use   Smoking status: Former    Types: Cigarettes   Smokeless tobacco: Never  Vaping Use   Vaping Use: Never used  Substance and Sexual Activity   Alcohol use: Not Currently    Comment: 6/week (on weekend)   Drug use: Not Currently   Sexual activity: Yes    Birth control/protection: None  Other Topics Concern   Not on file  Social History Narrative   Patient lives at home alone.    Patient works for [employer].   Patient has some college education.   Right handed.      Patient now works for [employer] and has received an associated an degree.   Caffeine 16 oz daily   One story home      Social Determinants of Health   Financial Resource Strain: Not on file  Food Insecurity: No Food Insecurity (08/02/2021)   Hunger Vital Sign    Worried About Running Out of Food in the Last Year: Never true    Ran Out of Food in the Last Year: Never  true  Transportation Needs: No Transportation Needs (08/02/2021)   PRAPARE - Hydrologist (Medical): No    Lack of Transportation (Non-Medical): No  Physical Activity: Not on file  Stress: Not on file  Social Connections: Not on file  Intimate Partner Violence: Not on file    Review of Systems  Psychiatric/Behavioral:  Positive for depression. The patient is not nervous/anxious.   All other systems reviewed and are negative.       Objective    BP 102/66   Pulse 74   Temp 98.1 F (36.7 C) (Oral)   Resp 16   Ht 5' 5"$  (1.651 m)   Wt 192 lb (87.1 kg)   SpO2 98%   BMI 31.95 kg/m   Physical Exam Vitals and nursing note reviewed.  Constitutional:      General: She is not in acute distress. HENT:     Head: Normocephalic and atraumatic.  Cardiovascular:     Rate and Rhythm: Normal rate and regular rhythm.  Pulmonary:      Effort: Pulmonary effort is normal.     Breath sounds: Normal breath sounds.  Neurological:     General: No focal deficit present.     Mental Status: She is alert and oriented to person, place, and time.  Psychiatric:        Mood and Affect: Mood is depressed.        Behavior: Behavior normal.         Assessment & Plan:   1. Migraine without aura and without status migrainosus, not intractable Management per consultant.  2. Depression, unspecified depression type Will start zoloft 25 mg daily and monitor  3. Encounter to establish care     Return in about 4 weeks (around 07/24/2022) for follow up.   Becky Sax, MD

## 2022-07-22 ENCOUNTER — Other Ambulatory Visit: Payer: Self-pay | Admitting: Family Medicine

## 2022-07-29 ENCOUNTER — Encounter: Payer: Self-pay | Admitting: Family Medicine

## 2022-07-29 ENCOUNTER — Ambulatory Visit (INDEPENDENT_AMBULATORY_CARE_PROVIDER_SITE_OTHER): Payer: BC Managed Care – PPO | Admitting: Family Medicine

## 2022-07-29 VITALS — BP 103/66 | HR 63 | Temp 98.1°F | Resp 16 | Wt 189.6 lb

## 2022-07-29 DIAGNOSIS — G43009 Migraine without aura, not intractable, without status migrainosus: Secondary | ICD-10-CM | POA: Diagnosis not present

## 2022-07-29 DIAGNOSIS — F32A Depression, unspecified: Secondary | ICD-10-CM | POA: Diagnosis not present

## 2022-07-29 MED ORDER — SERTRALINE HCL 50 MG PO TABS: 50.0000 mg | ORAL_TABLET | Freq: Every day | ORAL | 0 refills | Status: DC

## 2022-07-29 NOTE — Progress Notes (Unsigned)
Patient is here for follow-up from medication. Patient said that she needs to go up on dose some.

## 2022-07-30 ENCOUNTER — Encounter: Payer: Self-pay | Admitting: Family Medicine

## 2022-07-30 ENCOUNTER — Telehealth: Payer: Self-pay | Admitting: Licensed Clinical Social Worker

## 2022-07-30 NOTE — Telephone Encounter (Signed)
LCSWA called patient today to introduce herself and to assess patients' mental health needs.  Patient was referred by PCP for life stressors. Patient stated that she wanted to receive more ongoing therapy. LCSWA connected her with some resources below and via MyChart.   Counseling Resources   [email or web address]  [facility] [address] [place], [place] [address] [phone] or [phone] Walk-in urgent care 24/7 for anyone  For [facility] ONLY New patient assessments and therapy walk-ins: Monday and Wednesday 8am-11am First and second Friday 1pm-5pm New patient psychiatry and medication management walk-ins: Mondays, Wednesdays, Thursdays, Fridays 8am-11am No psychiatry walk-ins on Tuesday   *Accepts all insurance and uninsured for Urgent Care needs *Accepts Medicaid and uninsured for outpatient treatment   [facility] (Therapy and psychiatry) [facility] (near [place]) [address], [place], [place] [address] [phone] Fax: [phone] ([place])   [place] at [place],  [place]  [address] [phone] Call for appointment  [facility] of the [place] (Therapy only)  [facility] [address], [place], [place] [address] Monday - Friday: 8:30 a.m.-12 p.m. / 1 p.m.-2:30 p.m.  [facility] [address] [place], [place] [address] Monday-Friday: 8:30 a.m.-12 p.m. / 2-3:30 p.m. (INSURANCE REQUIRED -MEDICAID ACCEPTED) They do offer a sliding fee scale $20-$30/session   [facility] Counseling [place], [place] [address] Phone: [place] [address] [place] [address]  Phone: [phone] (Does not accept Medicaid) (only one provider accepts Medicare)  [facility] [address] (at [place], [place] [address] (Accepts Medicaid and Medicare)  [facility]) [address] [place]  [place], [place] [address]  Phone: [phone]  [address] [place], [place] [address] Phone: [phone] [facility] Medicaid) [facility] (Therapy only)  [address], [place], [place] [address] Phone: [phone]   [facility] (Therapy only)  [place], [place] [address] Phone: [phone] [facility] & Medicare)   [place] [address], [place], [place] [address] Phone: [phone] ([place]) [facility] [phone] [address] [place], [place] [address] Phone: [phone] [facility]) [facility] (Psychiatry only)  [phone] [address], [place], [place] [address] (Accepts Medicaid and Medicare) [place] (Psychiatry and therapy)  [place], [place], [place] [address] [phone] [facility] Medicare) [place] (psychiatry and therapy) [address], [place], [place] [address] [phone]  [facility] at [address], [place], [place], [place] [address] [phone] Accept [address], [place], [place], [place], [place],  and the following types of Medicaid; Alliance, [place], Partners, [place], [place], [employer], Healthy [place], [place] Complete, and [place], as well as offering a Manufacturing systems engineer and private payment options. Provides In-Office Appointments, Virtual Appointments, and Phone Consultations Offers medication management for ages 72 years old and up, including,  Medication Management for Suboxone and [place] [phone] [address], [place], [place] [address] ([place] Medicaid and Medicare)         19.  [facility] (therapy only)  [address] [place], [place] [address]  [phone] (Accepts medicare) 20. [facility]  (Suboxone and methodone) [phone] [address] [place], [place] [address] To Be Eligible for Opioid Treatment at [facility] you must be at least 34 years of age you have already tried other interventions that were not successful such as opioid detox, inpatient rehab for opioids, or outpatient counseling specifically for opioid dependency your [facility] drug test must be completely free of benzodiazepines (klonopin, xanax, valium, ativan, or other benz) you have reliable transportation to the [facility] clinic in [place] you recognize that counseling is a critical component of [facility]' Opioid Program and you agree to attend all required counseling sessions you are committed to total drug abstinence and will conscientiously strive to remain free of alcohol, marijuana, and other illicit substances while in treatment you desire a peaceful treatment atmosphere in which personal responsibility and respect toward staff and clients is the norm   21. [facility] [place], [place], [place] [address] Offers SAIOP (Substance Abuse Intensive Outpatient Program) [phone] 22. [facility] [address] [place], [place] [address] [phone] (Accepts medicare)  For those who are tech savvy, go on psychology today, type in your local city (i.e. [place]. [place]) and specify your insurance at the top of the screen after you search. (Medicaid if needed). You can also specify whether you are interested in therapy and psychiatry.  [email or web address]

## 2022-07-30 NOTE — Progress Notes (Signed)
Established Patient Office Visit  Subjective    Patient ID: Tracy Cohen, female    DOB: August 26, 1990  Age: 32 y.o. MRN: [id number]  CC:  Chief Complaint  Patient presents with   Follow-up    HPI Tracy Cohen presents for follow up of depression. Patient reports improvement but would now like to increase the med.  Patient denies acute complaints.    Outpatient Encounter Medications as of 07/29/2022  Medication Sig   Fremanezumab-vfrm (AJOVY) 225 MG/1.5ML SOAJ Inject 225 mg into the skin every 28 (twenty-eight) days.   levonorgestrel-ethinyl estradiol (SEASONALE) 0.15-0.03 MG tablet    ondansetron (ZOFRAN-ODT) 4 MG disintegrating tablet Take 1 tablet (4 mg total) by mouth every 8 (eight) hours as needed for nausea or vomiting.   Rimegepant Sulfate (NURTEC) 75 MG TBDP Take 1 tablet by mouth daily as needed (Maximum 1 tablet in 24 hours).   sertraline (ZOLOFT) 50 MG tablet Take 1 tablet (50 mg total) by mouth daily.   SUMAtriptan (IMITREX) 20 MG/ACT nasal spray Place 1 spray (20 mg total) into the nose as needed for migraine or headache. May repeat in 2 hours if headache persists or recurs.  Maximum 2 sprays in 24 hours.   [DISCONTINUED] sertraline (ZOLOFT) 25 MG tablet Take 1 tablet (25 mg total) by mouth daily.   No facility-administered encounter medications on file as of 07/29/2022.    Past Medical History:  Diagnosis Date   Allergy    Anxiety    Depression    Migraine     Past Surgical History:  Procedure Laterality Date   ADENOIDECTOMY     TONSILLECTOMY      Family History  Problem Relation Age of Onset   High blood pressure Mother    Mental illness Father        schizophrenia   Esophageal cancer Father    Stroke Maternal Grandmother    Mental illness Maternal Grandmother        bipolar   Kidney cancer Maternal Grandmother    Cancer Maternal Grandfather    Prostate cancer Maternal Grandfather    Colon cancer Maternal Grandfather    Mental illness Paternal  Grandmother    Cancer Paternal Grandmother    Kidney cancer Paternal Grandmother    Mental illness Paternal Grandfather     Social History   Socioeconomic History   Marital status: Single    Spouse name: Not on file   Number of children: 0   Years of education: 13   Highest education level: Associate degree: occupational, Hotel manager, or vocational program  Occupational History    Employer: [employer]  Tobacco Use   Smoking status: Former    Types: Cigarettes   Smokeless tobacco: Never  Vaping Use   Vaping Use: Never used  Substance and Sexual Activity   Alcohol use: Not Currently    Comment: 6/week (on weekend)   Drug use: Not Currently   Sexual activity: Yes    Birth control/protection: None  Other Topics Concern   Not on file  Social History Narrative   Patient lives at home alone.    Patient works for [employer].   Patient has some college education.   Right handed.      Patient now works for [employer] and has received an associated an degree.   Caffeine 16 oz daily   One story home      Social Determinants of Health   Financial Resource Strain: Not on file  Food Insecurity:  No Food Insecurity (08/02/2021)   Hunger Vital Sign    Worried About Running Out of Food in the Last Year: Never true    Ran Out of Food in the Last Year: Never true  Transportation Needs: No Transportation Needs (08/02/2021)   PRAPARE - Hydrologist (Medical): No    Lack of Transportation (Non-Medical): No  Physical Activity: Not on file  Stress: Not on file  Social Connections: Not on file  Intimate Partner Violence: Not on file    Review of Systems  Psychiatric/Behavioral:  Positive for depression. The patient is not nervous/anxious.   All other systems reviewed and are negative.       Objective    BP 103/66   Pulse 63   Temp 98.1 F (36.7 C) (Oral)   Resp 16   Wt 189 lb 9.6 oz (86 kg)   SpO2 98%   BMI 31.55 kg/m   Physical Exam Vitals  and nursing note reviewed.  Constitutional:      General: She is not in acute distress. HENT:     Head: Normocephalic and atraumatic.  Cardiovascular:     Rate and Rhythm: Normal rate and regular rhythm.  Pulmonary:     Effort: Pulmonary effort is normal.     Breath sounds: Normal breath sounds.  Neurological:     General: No focal deficit present.     Mental Status: She is alert and oriented to person, place, and time.  Psychiatric:        Mood and Affect: Mood normal.        Behavior: Behavior normal.         Assessment & Plan:   1. Depression, unspecified depression type Improved. Will increase zoloft from 25 to 50 mg daily and monitor  2. Migraine without aura and without status migrainosus, not intractable Continue present management  Return in about 4 weeks (around 08/26/2022) for follow up.   Becky Sax, MD

## 2022-08-24 ENCOUNTER — Other Ambulatory Visit: Payer: Self-pay | Admitting: Family Medicine

## 2022-08-26 NOTE — Telephone Encounter (Signed)
Unable to refill per protocol, Rx expired. Discontinued 07/29/22.  Requested Prescriptions  Pending Prescriptions Disp Refills   sertraline (ZOLOFT) 25 MG tablet [Pharmacy Med Name: SERTRALINE HCL 25 MG TABLET] 30 tablet 1    Sig: TAKE 1 TABLET (25 MG TOTAL) BY MOUTH DAILY.     Psychiatry:  Antidepressants - SSRI - sertraline Failed - 08/24/2022  2:19 AM      Failed - AST in normal range and within 360 days    AST  Date Value Ref Range Status  08/28/2021 20 15 - 41 U/L Final         Failed - ALT in normal range and within 360 days    ALT  Date Value Ref Range Status  08/28/2021 14 0 - 44 U/L Final         Passed - Completed PHQ-2 or PHQ-9 in the last 360 days      Passed - Valid encounter within last 6 months    Recent Outpatient Visits           4 weeks ago Depression, unspecified depression type   [facility], Lauris Poag, MD   2 months ago Migraine without aura and without status migrainosus, not intractable   [facility], MD   5 years ago Generalized anxiety disorder   [facility], Levell July, MD       Future Appointments             In 2 weeks Georganna Skeans, MD [facility]

## 2022-09-09 ENCOUNTER — Ambulatory Visit: Payer: BC Managed Care – PPO | Admitting: Family Medicine

## 2022-10-02 ENCOUNTER — Encounter: Payer: Self-pay | Admitting: Family Medicine

## 2022-10-02 ENCOUNTER — Ambulatory Visit: Payer: BC Managed Care – PPO | Admitting: Family Medicine

## 2022-10-02 VITALS — BP 107/68 | HR 71 | Temp 98.0°F | Resp 16 | Wt 181.0 lb

## 2022-10-02 DIAGNOSIS — G8929 Other chronic pain: Secondary | ICD-10-CM

## 2022-10-02 DIAGNOSIS — F32A Depression, unspecified: Secondary | ICD-10-CM

## 2022-10-02 DIAGNOSIS — M545 Low back pain, unspecified: Secondary | ICD-10-CM | POA: Diagnosis not present

## 2022-10-02 MED ORDER — GABAPENTIN 300 MG PO CAPS: 300.0000 mg | ORAL_CAPSULE | Freq: Three times a day (TID) | ORAL | 1 refills | Status: DC

## 2022-10-02 NOTE — Progress Notes (Unsigned)
New Patient Office Visit  Subjective    Patient ID: ANASTAISA GEFFRARD, female    DOB: 27-Jun-1990  Age: 32 y.o. MRN: [id number]  CC:  Chief Complaint  Patient presents with   Back Pain   Hip Pain    HPI SHANELLE MAKOWSKI presents to establish care ***  Outpatient Encounter Medications as of 10/02/2022  Medication Sig   Fremanezumab-vfrm (AJOVY) 225 MG/1.5ML SOAJ Inject 225 mg into the skin every 28 (twenty-eight) days.   gabapentin (NEURONTIN) 300 MG capsule Take 1 capsule (300 mg total) by mouth 3 (three) times daily.   sertraline (ZOLOFT) 50 MG tablet Take 1 tablet (50 mg total) by mouth daily.   levonorgestrel-ethinyl estradiol (SEASONALE) 0.15-0.03 MG tablet  (Patient not taking: Reported on 10/02/2022)   ondansetron (ZOFRAN-ODT) 4 MG disintegrating tablet Take 1 tablet (4 mg total) by mouth every 8 (eight) hours as needed for nausea or vomiting. (Patient not taking: Reported on 10/02/2022)   Rimegepant Sulfate (NURTEC) 75 MG TBDP Take 1 tablet by mouth daily as needed (Maximum 1 tablet in 24 hours). (Patient not taking: Reported on 10/02/2022)   SUMAtriptan (IMITREX) 20 MG/ACT nasal spray Place 1 spray (20 mg total) into the nose as needed for migraine or headache. May repeat in 2 hours if headache persists or recurs.  Maximum 2 sprays in 24 hours. (Patient not taking: Reported on 10/02/2022)   No facility-administered encounter medications on file as of 10/02/2022.    Past Medical History:  Diagnosis Date   Allergy    Anxiety    Depression    Migraine     Past Surgical History:  Procedure Laterality Date   ADENOIDECTOMY     TONSILLECTOMY      Family History  Problem Relation Age of Onset   High blood pressure Mother    Mental illness Father        schizophrenia   Esophageal cancer Father    Stroke Maternal Grandmother    Mental illness Maternal Grandmother        bipolar   Kidney cancer Maternal Grandmother    Cancer Maternal Grandfather    Prostate cancer Maternal  Grandfather    Colon cancer Maternal Grandfather    Mental illness Paternal Grandmother    Cancer Paternal Grandmother    Kidney cancer Paternal Grandmother    Mental illness Paternal Grandfather     Social History   Socioeconomic History   Marital status: Single    Spouse name: Not on file   Number of children: 0   Years of education: 13   Highest education level: Associate degree: occupational, Scientist, product/process development, or vocational program  Occupational History    Employer: [employer]  Tobacco Use   Smoking status: Former    Types: Cigarettes   Smokeless tobacco: Never  Vaping Use   Vaping Use: Never used  Substance and Sexual Activity   Alcohol use: Not Currently    Comment: 6/week (on weekend)   Drug use: Not Currently   Sexual activity: Yes    Birth control/protection: None  Other Topics Concern   Not on file  Social History Narrative   Patient lives at home alone.    Patient works for [employer]   Patient has some college education.   Right handed.      Patient now works for [employer] and has received an associated an degree.   Caffeine 16 oz daily   One story home      Social Determinants of  Health   Financial Resource Strain: Not on file  Food Insecurity: No Food Insecurity (08/02/2021)   Hunger Vital Sign    Worried About Running Out of Food in the Last Year: Never true    Ran Out of Food in the Last Year: Never true  Transportation Needs: No Transportation Needs (08/02/2021)   PRAPARE - Administrator, Civil Service (Medical): No    Lack of Transportation (Non-Medical): No  Physical Activity: Not on file  Stress: Not on file  Social Connections: Not on file  Intimate Partner Violence: Not on file    ROS      Objective    BP 107/68   Pulse 71   Temp 98 F (36.7 C) (Oral)   Resp 16   Wt 181 lb (82.1 kg)   SpO2 97%   BMI 30.12 kg/m   Physical Exam  {Labs (Optional):23779}    Assessment & Plan:   Problem List Items Addressed This  Visit   None Visit Diagnoses     Chronic right-sided low back pain, unspecified whether sciatica present    -  Primary   Relevant Medications   gabapentin (NEURONTIN) 300 MG capsule   Other Relevant Orders   Ambulatory referral to Orthopedics   Depression, unspecified depression type           No follow-ups on file.   Tommie Raymond, MD

## 2022-10-03 ENCOUNTER — Encounter: Payer: Self-pay | Admitting: Family Medicine

## 2022-10-03 ENCOUNTER — Encounter: Payer: Self-pay | Admitting: Physical Medicine and Rehabilitation

## 2022-10-03 ENCOUNTER — Ambulatory Visit (INDEPENDENT_AMBULATORY_CARE_PROVIDER_SITE_OTHER): Payer: BC Managed Care – PPO | Admitting: Physical Medicine and Rehabilitation

## 2022-10-03 ENCOUNTER — Other Ambulatory Visit (INDEPENDENT_AMBULATORY_CARE_PROVIDER_SITE_OTHER): Payer: BC Managed Care – PPO

## 2022-10-03 DIAGNOSIS — M5416 Radiculopathy, lumbar region: Secondary | ICD-10-CM | POA: Diagnosis not present

## 2022-10-03 DIAGNOSIS — M5441 Lumbago with sciatica, right side: Secondary | ICD-10-CM | POA: Diagnosis not present

## 2022-10-03 DIAGNOSIS — M7918 Myalgia, other site: Secondary | ICD-10-CM | POA: Diagnosis not present

## 2022-10-03 DIAGNOSIS — G8929 Other chronic pain: Secondary | ICD-10-CM | POA: Diagnosis not present

## 2022-10-03 MED ORDER — MELOXICAM 15 MG PO TABS: 15.0000 mg | ORAL_TABLET | Freq: Every day | ORAL | 0 refills | Status: DC

## 2022-10-03 MED ORDER — METHOCARBAMOL 500 MG PO TABS: 500.0000 mg | ORAL_TABLET | Freq: Three times a day (TID) | ORAL | 0 refills | Status: DC

## 2022-10-03 NOTE — Progress Notes (Signed)
Tracy Cohen - 32 y.o. female MRN [id number]  Date of birth: 1991-02-14  Office Visit Note: Visit Date: 10/03/2022 PCP: Georganna Skeans, MD Referred by: Georganna Skeans, MD  Subjective: Chief Complaint  Patient presents with   Lower Back - Pain   HPI: Tracy Cohen is a 32 y.o. female who comes in today per the request of Dr. Georganna Skeans for evaluation of right sided lower back pain radiating to hip and down lateral leg to knee. Patient is tearful during our visit today. Pain ongoing for over a year, started when she was pregnant with daughter. Pain worsens with movement and activity. She describes pain as sharp and stabbing sensation, currently rates as 8 out of 10. She also reports pain to entire back that does radiate to chest intermittently. She reports diffuse back pain ongoing about 1 month. Some relief of pain with home exercise regimen, rest and use of medications. History of pelvic physical therapy at [facility] for chronic urinary incontinence, minimal relief of symptoms with these treatments. No prior imaging of lumbar spine. No history of lumbar surgery/injections.     Oswestry Disability Index Score 64% 30 to 40 (80%): very severe disability: Back pain impinges on all aspects of the patient's life. Positive intervention is required.  Review of Systems  Musculoskeletal:  Positive for back pain and myalgias.  Neurological:  Negative for tingling, sensory change, focal weakness and weakness.  All other systems reviewed and are negative.  Otherwise per HPI.  Assessment & Plan: Visit Diagnoses:    ICD-10-CM   1. Chronic right-sided low back pain with right-sided sciatica  M54.41 Ambulatory referral to Physical Therapy   G89.29 MR LUMBAR SPINE WO CONTRAST    2. Lumbar radiculopathy  M54.16 XR Lumbar Spine 2-3 Views    Ambulatory referral to Physical Therapy    MR LUMBAR SPINE WO CONTRAST    3. Myofascial pain syndrome  M79.18 Ambulatory referral to Physical Therapy     MR LUMBAR SPINE WO CONTRAST       Plan: Findings:  Chronic, worsening and severe right sided lower back pain radiating to hip and down lateral leg to knee. Patient continues to have severe pain despite good conservative therapy such as home exercise regimen, rest and use of medications. I obtained lumbar x-rays in the office today that look good for her age, normal anatomical alignment, no spondylolisthesis noted.  Patient's clinical presentation consistent with L5 nerve pattern.  I also feel there could be a myofascial component contributing to her pain as she does experience diffuse tenderness to bilateral thoracic and lumbar paraspinal regions upon palpation today.  We discussed treatment plan in detail today.  I placed order for formal physical therapy as I do feel she would benefit from core strengthening, manual treatments and possible dry needling.  Also placed order for lumbar MRI imaging.  Depending on results of lumbar MRI imaging we would consider performing lumbar epidural steroid injection.  I also discussed medication management and prescribed short course of Mobic and Robaxin.  I would like to have patient follow-up with [place] in approximately 6 weeks for reevaluation.  No red flag symptoms noted upon exam today.    Meds & Orders:  Meds ordered this encounter  Medications   meloxicam (MOBIC) 15 MG tablet    Sig: Take 1 tablet (15 mg total) by mouth daily.    Dispense:  30 tablet    Refill:  0   methocarbamol (ROBAXIN) 500 MG tablet  Sig: Take 1 tablet (500 mg total) by mouth 3 (three) times daily.    Dispense:  90 tablet    Refill:  0    Orders Placed This Encounter  Procedures   XR Lumbar Spine 2-3 Views   MR LUMBAR SPINE WO CONTRAST   Ambulatory referral to Physical Therapy    Follow-up: Return for 6 week follow up for re-evaluation.   Procedures: No procedures performed      Clinical History: No specialty comments available.   She reports that she has quit smoking.  Her smoking use included cigarettes. She has never used smokeless tobacco. No results for input(s): "HGBA1C", "LABURIC" in the last 8760 hours.  Objective:  VS:  HT:    WT:   BMI:     BP:   HR: bpm  TEMP: ( )  RESP:  Physical Exam Vitals and nursing note reviewed.  HENT:     Head: Normocephalic and atraumatic.     Right Ear: External ear normal.     Left Ear: External ear normal.     Nose: Nose normal.     Mouth/Throat:     Mouth: Mucous membranes are moist.  Eyes:     Extraocular Movements: Extraocular movements intact.  Cardiovascular:     Rate and Rhythm: Normal rate.     Pulses: Normal pulses.  Pulmonary:     Effort: Pulmonary effort is normal.  Abdominal:     General: Abdomen is flat. There is no distension.  Musculoskeletal:        General: Tenderness present.     Cervical back: Normal range of motion.     Comments: Patient rises from seated position to standing without difficulty. Good lumbar range of motion. No pain noted with facet loading. 5/5 strength noted with bilateral hip flexion, knee flexion/extension, ankle dorsiflexion/plantarflexion and EHL. No clonus noted bilaterally. No pain upon palpation of greater trochanters. No pain with internal/external rotation of bilateral hips. Sensation intact bilaterally. Dysesthesias noted to right L5 dermatome. Diffuse tenderness noted upon palpation of both thoracic and lumbar paraspinal regions. Negative slump test bilaterally. Ambulates without aid, gait steady.     Skin:    General: Skin is warm and dry.     Capillary Refill: Capillary refill takes less than 2 seconds.  Neurological:     General: No focal deficit present.     Mental Status: She is alert and oriented to person, place, and time.  Psychiatric:        Mood and Affect: Mood normal.        Behavior: Behavior normal.     Ortho Exam  Imaging: XR Lumbar Spine 2-3 Views  Result Date: 10/03/2022 AP and lateral radiographs exhibits normal anatomical  alignment, no spondylolisthesis, well preserved disc spacing. No acute fractures or dislocations.    Past Medical/Family/Surgical/Social History: Medications & Allergies reviewed per EMR, new medications updated. Patient Active Problem List   Diagnosis Date Noted   Stress incontinence of urine 02/28/2022   Nexplanon insertion 09/28/2021   Postpartum depression 09/13/2021   Postpartum endometritis 08/29/2021   Gestational hypertension 07/18/2021   Atopic dermatitis 10/13/2020   Gastroesophageal reflux disease 07/07/2018   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   Cervical lymphadenopathy 03/31/2017   GAD (generalized anxiety disorder) 12/13/2013   Migraine 04/23/2013   Past Medical History:  Diagnosis Date   Allergy    Anxiety    Depression    Migraine    Family History  Problem Relation Age of  Onset   High blood pressure Mother    Mental illness Father        schizophrenia   Esophageal cancer Father    Stroke Maternal Grandmother    Mental illness Maternal Grandmother        bipolar   Kidney cancer Maternal Grandmother    Cancer Maternal Grandfather    Prostate cancer Maternal Grandfather    Colon cancer Maternal Grandfather    Mental illness Paternal Grandmother    Cancer Paternal Grandmother    Kidney cancer Paternal Grandmother    Mental illness Paternal Grandfather    Past Surgical History:  Procedure Laterality Date   ADENOIDECTOMY     TONSILLECTOMY     Social History   Occupational History    Employer: [employer]  Tobacco Use   Smoking status: Former    Types: Cigarettes   Smokeless tobacco: Never  Vaping Use   Vaping Use: Never used  Substance and Sexual Activity   Alcohol use: Not Currently    Comment: 6/week (on weekend)   Drug use: Not Currently   Sexual activity: Yes    Birth control/protection: None

## 2022-10-03 NOTE — Progress Notes (Signed)
Functional Pain Scale - descriptive words and definitions  Distracting (5)    Aware of pain/able to complete some ADL's but limited by pain/sleep is affected and active distractions are only slightly useful. Moderate range order  Average Pain  varies  Lower back pain on right side that radiates into the right hip

## 2022-10-08 NOTE — Therapy (Addendum)
OUTPATIENT PHYSICAL THERAPY EVALUATION /DISCHARGE   Patient Name: Tracy Cohen MRN: [id number] DOB:Oct 03, 1990, 32 y.o., female Today's Date: 10/09/2022  END OF SESSION:  PT End of Session - 10/09/22 0800     Visit Number 1    Number of Visits 20    Date for PT Re-Evaluation 12/18/22    Authorization Type [employer] 20% coinsurance    Progress Note Due on Visit 10    PT Start Time 0805    PT Stop Time 0835    PT Time Calculation (min) 30 min    Activity Tolerance Patient tolerated treatment well    Behavior During Therapy [facility] for tasks assessed/performed             Past Medical History:  Diagnosis Date   Allergy    Anxiety    Depression    Migraine    Past Surgical History:  Procedure Laterality Date   ADENOIDECTOMY     TONSILLECTOMY     Patient Active Problem List   Diagnosis Date Noted   Stress incontinence of urine 02/28/2022   Nexplanon insertion 09/28/2021   Postpartum depression 09/13/2021   Postpartum endometritis 08/29/2021   Gestational hypertension 07/18/2021   Atopic dermatitis 10/13/2020   Gastroesophageal reflux disease 07/07/2018   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   Cervical lymphadenopathy 03/31/2017   GAD (generalized anxiety disorder) 12/13/2013   Migraine 04/23/2013    PCP: Georganna Skeans MD  REFERRING PROVIDER: Juanda Chance, NP  REFERRING DIAG: M54.16 (ICD-10-CM) - Lumbar radiculopathy M79.18 (ICD-10-CM) - Myofascial pain syndrome M54.41,G89.29 (ICD-10-CM) - Chronic right-sided low back pain with right-sided sciatica  Rationale for Evaluation and Treatment: Rehabilitation  THERAPY DIAG:  Other low back pain  Pain in right hip  Muscle weakness (generalized)  Difficulty in walking, not elsewhere classified  ONSET DATE: Within last year, 10/2021  SUBJECTIVE:                                                                                                                                                                                            SUBJECTIVE STATEMENT: Referral for low back pain.   Pt indicated having complaints in lower back and hip progressively worsening during the day.  Variable symptoms during day.    Reported thoracic pain at times as well.   Reported Rt side back pain more than Lt but still present across back.   Difficulty sleeping due to symptoms.   Denied numbness/tingling.   Reported incontinence since pregnancy as well.  Saw physical therapy for period of time without noted improvement.   PERTINENT HISTORY:  Pending MRI order in file.  PAIN:  NPRS scale: current 8/10, at worst 10/1, at best 4/10 Pain location: low back, Rt hip, thoracic pain Pain description: sharp, achy Aggravating factors: bending, lifting, prolonged walking, prolonged sitting with work Relieving factors: nothing specific   PRECAUTIONS: None  WEIGHT BEARING RESTRICTIONS: No  FALLS:  Has patient fallen in last 6 months? No  LIVING ENVIRONMENT: Lives in: House/apartment  OCCUPATION: Customer service, sitting based.   PLOF: Independent, used to work out 3x/week prior to onset of symptoms/pregnancy (hasn't returned).  PATIENT GOALS: Reduce pain, be consistent with plan going forward.   OBJECTIVE:   PATIENT SURVEYS:  10/09/2022 FOTO eval: 50      predicted:  55  SCREENING FOR RED FLAGS: 10/09/2022 Bowel or bladder incontinence: No Cauda equina syndrome: No  COGNITION: 10/09/2022 Overall cognitive status: WFL normal      SENSATION: 10/09/2022 [facility]  MUSCLE LENGTH: 10/09/2022 Passive SLR Hamstrings: Right 70 deg; Left 90 deg, both with back pain produced   POSTURE:  10/09/2022  Unremarkable in standing posture.   PALPATION: 10/09/2022 No specific palpation tenderness to light touch in lumbar region.   LUMBAR ROM:   10/09/2022:  No centralization or peripheralization noted in lumbar movements.   AROM 10/09/2022  Flexion To mid shin with end range pulling in back/chest  Extension 75% WFL  with low back pain produced.  Repeated x 5 in standing, improved to 100 % with possible reduction in end range amount  Right lateral flexion  To knee joint , Pain Rt lumbar  Left lateral flexion To knee joint, Pain in rt lumbar  Right rotation   Left rotation    (Blank rows = not tested)  LOWER EXTREMITY ROM:      Right 10/09/2022 Left 10/09/2022  Hip flexion    Hip extension    Hip abduction    Hip adduction    Hip internal rotation    Hip external rotation    Knee flexion    Knee extension    Ankle dorsiflexion    Ankle plantarflexion    Ankle inversion    Ankle eversion     (Blank rows = not tested)  LOWER EXTREMITY MMT:    MMT Right 10/09/2022 Left 10/09/2022  Hip flexion 5/5 5/5  Hip extension    Hip abduction    Hip adduction    Hip internal rotation    Hip external rotation    Knee flexion 5/5 5/5  Knee extension 5/5 5/5  Ankle dorsiflexion 5/5 5/5          Lumbar flexion   3/5    (Blank rows = not tested)  LUMBAR SPECIAL TESTS:  10/09/2022 (-) slump bilateral, (-) crossed SLR for radicular symptoms bilaterally  FUNCTIONAL TESTS:  10/09/2022 18 inch transfer s UE assist on 1st try s symptoms  GAIT: 10/09/2022 Independent   TODAY'S TREATMENT:                                                                              DATE: 10/09/2022  Therex:    HEP instruction/performance c cues for techniques, handout provided.  Trial set performed of each for comprehension and symptom assessment.  See below for exercise list  PATIENT  EDUCATION:  10/09/2022 Education details: HEP, POC Person educated: Patient Education method: Programmer, multimedia, Demonstration, Verbal cues, and Handouts Education comprehension: verbalized understanding, returned demonstration, and verbal cues required  HOME EXERCISE PROGRAM: Access Code: [email or web address] URL: [email or web address] Date: 10/09/2022 Prepared by: Chyrel Masson  Exercises - Supine Lower Trunk Rotation  - 3-5 x daily - 7  x weekly - 1 sets - 3-5 reps - 15 hold - Standing Lumbar Extension with Counter  - 3-5 x daily - 7 x weekly - 1 sets - 5-10 reps - Seated Scapular Retraction  - 3-5 x daily - 7 x weekly - 1 sets - 10 reps - 3-5 hold - Seated Multifidi Isometric  - 2-3 x daily - 7 x weekly - 1 sets - 10 reps - 5-10 hold - Seated Pelvic Floor Contraction  - 2-3 x daily - 7 x weekly - 1 sets - 10 reps - 5-10 hold  ASSESSMENT:  CLINICAL IMPRESSION: Patient is a 32 y.o. who comes to clinic with complaints of low back pain, Rt hip pain  with mobility, strength and movement coordination deficits that impair their ability to perform usual daily and recreational functional activities without increase difficulty/symptoms at this time.  Patient to benefit from skilled PT services to address impairments and limitations to improve to previous level of function without restriction secondary to condition.   OBJECTIVE IMPAIRMENTS: decreased activity tolerance, decreased coordination, decreased endurance, decreased mobility, difficulty walking, decreased ROM, decreased strength, increased fascial restrictions, impaired perceived functional ability, increased muscle spasms, impaired flexibility, improper body mechanics, and pain.   ACTIVITY LIMITATIONS: carrying, lifting, bending, sitting, standing, squatting, sleeping, continence, and locomotion level  PARTICIPATION LIMITATIONS: cleaning, laundry, interpersonal relationship, community activity, and occupation  PERSONAL FACTORS: Time since onset of injury/illness/exacerbation are also affecting patient's functional outcome.   REHAB POTENTIAL: Good  CLINICAL DECISION MAKING: Stable/uncomplicated  EVALUATION COMPLEXITY: Low   GOALS: Goals reviewed with patient? Yes  SHORT TERM GOALS: (target date for Short term goals are 3 weeks 10/30/2022)  1. Patient will demonstrate independent use of home exercise program to maintain progress from in clinic treatments.  Goal status:  New  LONG TERM GOALS: (target dates for all long term goals are 10 weeks  12/18/2022 )   1. Patient will demonstrate/report pain at worst less than or equal to 2/10 to facilitate minimal limitation in daily activity secondary to pain symptoms.  Goal status: New   2. Patient will demonstrate independent use of home exercise program to facilitate ability to maintain/progress functional gains from skilled physical therapy services.  Goal status: New   3. Patient will demonstrate FOTO outcome > or = 55 % to indicate reduced disability due to condition.  Goal status: New   4. Patient will demonstrate lumbar extension 100 % WFL s symptoms to facilitate upright standing, walking posture at PLOF s limitation.  Goal status: New   5.  Patient will demonstrate lateral flexion lumbar to knee joint s symptoms for usual mobility in daily activity.   Goal status: New   6.  Patient will demonstrate supine bilateral SLR lumbar flexion MMT > or = 4/5 to faciltiate improved core stability.  Goal status: New   7.  Patient will demonstrate Rt passive SLR > or = 80 deg to improve mobility towards equal to Lt leg.  Goal Status: New  PLAN:  PT FREQUENCY: 1-2x/week  PT DURATION: 10 weeks  PLANNED INTERVENTIONS: Therapeutic exercises, Therapeutic activity, Neuro Muscular re-education, Balance training, Gait training, Patient/Family education, Joint mobilization,  Stair training, DME instructions, Dry Needling, Electrical stimulation, Cryotherapy, vasopneumatic device, Moist heat, Taping, Traction Ultrasound, Ionotophoresis 4mg /ml Dexamethasone, and aquatic therapy, Manual therapy.  All included unless contraindicated  PLAN FOR NEXT SESSION: Review HEP knowledge/results.   Progressive lumbar mobility gains, early deep core strengthening activity.    Chyrel Masson, PT, DPT, OCS, ATC 10/09/22  8:40 AM   PHYSICAL THERAPY DISCHARGE SUMMARY  Visits from Start of Care: 1  Current functional level  related to goals / functional outcomes: See note   Remaining deficits: See note   Education / Equipment: HEP  Patient goals were not met. Patient is being discharged due to not returning since the last visit.  Chyrel Masson, PT, DPT, OCS, ATC 12/18/22  11:20 AM

## 2022-10-09 ENCOUNTER — Ambulatory Visit (INDEPENDENT_AMBULATORY_CARE_PROVIDER_SITE_OTHER): Payer: BC Managed Care – PPO | Admitting: Rehabilitative and Restorative Service Providers"

## 2022-10-09 ENCOUNTER — Encounter: Payer: Self-pay | Admitting: Rehabilitative and Restorative Service Providers"

## 2022-10-09 ENCOUNTER — Other Ambulatory Visit: Payer: Self-pay

## 2022-10-09 ENCOUNTER — Encounter: Payer: Self-pay | Admitting: Family Medicine

## 2022-10-09 DIAGNOSIS — M5459 Other low back pain: Secondary | ICD-10-CM

## 2022-10-09 DIAGNOSIS — M25551 Pain in right hip: Secondary | ICD-10-CM | POA: Diagnosis not present

## 2022-10-09 DIAGNOSIS — M6281 Muscle weakness (generalized): Secondary | ICD-10-CM | POA: Diagnosis not present

## 2022-10-09 DIAGNOSIS — R262 Difficulty in walking, not elsewhere classified: Secondary | ICD-10-CM

## 2022-10-15 ENCOUNTER — Ambulatory Visit (INDEPENDENT_AMBULATORY_CARE_PROVIDER_SITE_OTHER): Payer: BC Managed Care – PPO | Admitting: Family Medicine

## 2022-10-15 ENCOUNTER — Encounter: Payer: Self-pay | Admitting: Family Medicine

## 2022-10-15 ENCOUNTER — Other Ambulatory Visit: Payer: Self-pay

## 2022-10-15 VITALS — BP 113/80 | HR 106 | Ht 60.0 in | Wt 181.2 lb

## 2022-10-15 DIAGNOSIS — F411 Generalized anxiety disorder: Secondary | ICD-10-CM | POA: Diagnosis not present

## 2022-10-15 DIAGNOSIS — M545 Low back pain, unspecified: Secondary | ICD-10-CM

## 2022-10-15 DIAGNOSIS — G8929 Other chronic pain: Secondary | ICD-10-CM | POA: Diagnosis not present

## 2022-10-15 MED ORDER — SERTRALINE HCL 100 MG PO TABS: 100.0000 mg | ORAL_TABLET | Freq: Every day | ORAL | 3 refills | Status: DC

## 2022-10-15 MED ORDER — GABAPENTIN 300 MG PO CAPS
300.0000 mg | ORAL_CAPSULE | Freq: Two times a day (BID) | ORAL | 2 refills | Status: DC
Start: 2022-10-15 — End: 2023-01-01

## 2022-10-15 NOTE — Assessment & Plan Note (Signed)
Severe symptoms of anxiety with very high GAD score. Tracy Cohen has always been anxious in the few years I have known her but this appears much more severe than usual and it is clearly impacting her daily life and ability to function. Given this I recommended referral to psychiatry which she is amenable to. Will also increase sertraline to 100 mg daily and start gabapentin 300 mg BID. She may be a good candidate for benzodiazapene therapy, defer to psychiatry evaluation. Also inquiring about FMLA for mental health. Discussed I am happy to complete this paperwork but despite being boarded in FM these requests have been denied in the past due to coming from an OBGYN office. Encouraged to schedule an appt and discuss with PCP.  Will see back in a month for mood check.

## 2022-10-15 NOTE — Progress Notes (Signed)
[facility] VISIT NOTE  History:   Tracy Cohen is a 32 y.o. G2P1011 here today for anxiety.  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 and she does not work if she does not get paid but still has to pay the daycare even if they close early for the day Struggling with bills She is tearfully crying throughout our visit  Flowsheet Row Office Visit from 10/15/2022 in [facility] at [facility]  PHQ-9 Total Score 9         10/15/2022    2:49 PM 10/02/2022    9:53 AM 06/26/2022    3:18 PM 10/04/2021    1:22 PM  GAD 7 : Generalized Anxiety Score  Nervous, Anxious, on Edge 3 0 3 1  Control/stop worrying 3 0 3 1  Worry too much - different things 3 0 3 1  Trouble relaxing 3 0 3 0  Restless 3 0 3 0  Easily annoyed or irritable 1 0 3 1  Afraid - awful might happen 3 0 1 0  Total GAD 7 Score 19 0 19 4  Anxiety Difficulty Very difficult Not difficult at all Somewhat difficult       Health Maintenance Due  Topic Date Due   PAP-Cervical Cytology Screening  10/18/2022    Past Medical History:  Diagnosis Date   Allergy    Anxiety    Depression    Migraine     Past Surgical History:  Procedure Laterality Date   ADENOIDECTOMY     TONSILLECTOMY      The following portions of the patient's history were reviewed and updated as appropriate: allergies, current medications, past family history, past medical history, past social history, past surgical history and problem list.   Health Maintenance:   Last pap: No results found for: "DIAGPAP", "HPV", "HPVHIGH" 10/18/2019 - NILM, neg HPV  Last mammogram:  N/a    Review of Systems:  Pertinent items noted in HPI and remainder of  comprehensive ROS otherwise negative.  Physical Exam:  BP 113/80   Pulse (!) 106   Ht 5' (1.524 m)   Wt 181 lb 3.2 oz (82.2 kg)   LMP 09/16/2022 (Exact Date)   Breastfeeding No   BMI 35.39 kg/m  CONSTITUTIONAL: Well-developed, well-nourished female in no acute distress.  HEENT:  Normocephalic, atraumatic. External right and left ear normal. No scleral icterus.  NECK: Normal range of motion, supple, no masses noted on observation SKIN: No rash noted. Not diaphoretic. No erythema. No pallor. MUSCULOSKELETAL: Normal range of motion. No edema noted. NEUROLOGIC: Alert and oriented to person, place, and time. Normal muscle tone coordination.  PSYCHIATRIC: Very anxious affect RESPIRATORY: Effort normal, no problems with respiration noted   Labs and Imaging No results found for this or any previous visit (from the past 168 hour(s)). XR Lumbar Spine 2-3 Views  Result Date: 10/03/2022 AP and lateral radiographs exhibits normal anatomical alignment, no spondylolisthesis, well preserved disc spacing. No acute fractures or dislocations.      Assessment and Plan:   Problem List Items Addressed This Visit       Other   GAD (generalized anxiety disorder) - Primary  Severe symptoms of anxiety with very high GAD score. Genelle has always been anxious in the few years I have known her but this appears much more severe than usual and it is clearly impacting her daily life and ability to function. Given this I recommended referral to psychiatry which she is amenable to. Will also increase sertraline to 100 mg daily and start gabapentin 300 mg BID. She may be a good candidate for benzodiazapene therapy, defer to psychiatry evaluation. Also inquiring about FMLA for mental health. Discussed I am happy to complete this paperwork but despite being boarded in FM these requests have been denied in the past due to coming from an OBGYN office. Encouraged to schedule an appt and discuss with PCP.  Will see back  in a month for mood check.       Relevant Medications   sertraline (ZOLOFT) 100 MG tablet   gabapentin (NEURONTIN) 300 MG capsule   Other Relevant Orders   Ambulatory referral to Psychiatry   Other Visit Diagnoses     Chronic right-sided low back pain, unspecified whether sciatica present       Relevant Medications   sertraline (ZOLOFT) 100 MG tablet   gabapentin (NEURONTIN) 300 MG capsule       Routine preventative health maintenance measures emphasized. Please refer to After Visit Summary for other counseling recommendations.   Return in about 4 weeks (around 11/12/2022) for [facility] patient, mood check.    Total face-to-face time with patient: 25 minutes.  Over 50% of encounter was spent on counseling and coordination of care.   Venora Maples, MD/MPH Attending Family Medicine Physician, [facility] for [facility], [facility]

## 2022-10-16 ENCOUNTER — Encounter: Payer: Self-pay | Admitting: Family Medicine

## 2022-10-18 ENCOUNTER — Telehealth: Payer: Self-pay | Admitting: Family Medicine

## 2022-10-18 NOTE — Telephone Encounter (Signed)
Message sent to scheduler to call patient for an appointment

## 2022-10-18 NOTE — Telephone Encounter (Signed)
Called Tracy Cohen about her FMLA papers. Informed patient I had spoken to Dr. Crissie Reese about her paperwork, and he had told her she should get them filled out by her PCP. Because he is listed as an OBGYN provider, her paperwork may be denied. She said it was fine. She was going to see if they will allow it because her appointment with her PCP is not until July and she will need them sent before then.

## 2022-10-20 ENCOUNTER — Ambulatory Visit
Admission: RE | Admit: 2022-10-20 | Discharge: 2022-10-20 | Disposition: A | Discharge status: Home / Self Care | Payer: BC Managed Care – PPO | Source: Ambulatory Visit | Attending: Physical Medicine and Rehabilitation | Admitting: Physical Medicine and Rehabilitation

## 2022-10-20 DIAGNOSIS — M5126 Other intervertebral disc displacement, lumbar region: Secondary | ICD-10-CM | POA: Diagnosis not present

## 2022-10-20 DIAGNOSIS — M47816 Spondylosis without myelopathy or radiculopathy, lumbar region: Secondary | ICD-10-CM | POA: Diagnosis not present

## 2022-10-20 DIAGNOSIS — G8929 Other chronic pain: Secondary | ICD-10-CM

## 2022-10-20 DIAGNOSIS — M5416 Radiculopathy, lumbar region: Secondary | ICD-10-CM

## 2022-10-20 DIAGNOSIS — M7918 Myalgia, other site: Secondary | ICD-10-CM

## 2022-10-22 ENCOUNTER — Encounter: Payer: Self-pay | Admitting: Family Medicine

## 2022-10-22 ENCOUNTER — Ambulatory Visit: Payer: BC Managed Care – PPO | Admitting: Family Medicine

## 2022-10-22 VITALS — BP 111/73 | HR 60 | Temp 97.7°F | Resp 16 | Wt 179.8 lb

## 2022-10-22 DIAGNOSIS — Z0289 Encounter for other administrative examinations: Secondary | ICD-10-CM

## 2022-10-22 DIAGNOSIS — F32A Depression, unspecified: Secondary | ICD-10-CM

## 2022-10-22 NOTE — Progress Notes (Unsigned)
Patient came in to have paper work filled out  -FMLA - Patient has no other concerns today

## 2022-10-23 ENCOUNTER — Encounter: Payer: Self-pay | Admitting: Family Medicine

## 2022-10-23 ENCOUNTER — Other Ambulatory Visit: Payer: Self-pay | Admitting: Physical Medicine and Rehabilitation

## 2022-10-23 DIAGNOSIS — M5416 Radiculopathy, lumbar region: Secondary | ICD-10-CM

## 2022-10-23 DIAGNOSIS — G8929 Other chronic pain: Secondary | ICD-10-CM

## 2022-10-23 NOTE — Progress Notes (Signed)
Established Patient Office Visit  Subjective    Patient ID: Tracy Cohen, female    DOB: August 20, 1990  Age: 32 y.o. MRN: [id number]  CC:  Chief Complaint  Patient presents with   FMLA    HPI Tracy Cohen presents for follow up of depression/anxiety as well as completion of FMLA forms.    Outpatient Encounter Medications as of 10/22/2022  Medication Sig   Fremanezumab-vfrm (AJOVY) 225 MG/1.5ML SOAJ Inject 225 mg into the skin every 28 (twenty-eight) days.   gabapentin (NEURONTIN) 300 MG capsule Take 1 capsule (300 mg total) by mouth 2 (two) times daily.   meloxicam (MOBIC) 15 MG tablet Take 1 tablet (15 mg total) by mouth daily.   methocarbamol (ROBAXIN) 500 MG tablet Take 1 tablet (500 mg total) by mouth 3 (three) times daily.   ondansetron (ZOFRAN-ODT) 4 MG disintegrating tablet Take 1 tablet (4 mg total) by mouth every 8 (eight) hours as needed for nausea or vomiting.   Rimegepant Sulfate (NURTEC) 75 MG TBDP Take 1 tablet by mouth daily as needed (Maximum 1 tablet in 24 hours).   sertraline (ZOLOFT) 100 MG tablet Take 1 tablet (100 mg total) by mouth at bedtime.   SUMAtriptan (IMITREX) 20 MG/ACT nasal spray Place 1 spray (20 mg total) into the nose as needed for migraine or headache. May repeat in 2 hours if headache persists or recurs.  Maximum 2 sprays in 24 hours.   No facility-administered encounter medications on file as of 10/22/2022.    Past Medical History:  Diagnosis Date   Allergy    Anxiety    Depression    Migraine     Past Surgical History:  Procedure Laterality Date   ADENOIDECTOMY     TONSILLECTOMY      Family History  Problem Relation Age of Onset   High blood pressure Mother    Mental illness Father        schizophrenia   Esophageal cancer Father    Stroke Maternal Grandmother    Mental illness Maternal Grandmother        bipolar   Kidney cancer Maternal Grandmother    Cancer Maternal Grandfather    Prostate cancer Maternal Grandfather     Colon cancer Maternal Grandfather    Mental illness Paternal Grandmother    Cancer Paternal Grandmother    Kidney cancer Paternal Grandmother    Mental illness Paternal Grandfather     Social History   Socioeconomic History   Marital status: Single    Spouse name: Not on file   Number of children: 0   Years of education: 13   Highest education level: Associate degree: occupational, Scientist, product/process development, or vocational program  Occupational History    Employer: [employer]  Tobacco Use   Smoking status: Former    Types: Cigarettes   Smokeless tobacco: Never  Vaping Use   Vaping Use: Never used  Substance and Sexual Activity   Alcohol use: Not Currently    Comment: 6/week (on weekend)   Drug use: Not Currently   Sexual activity: Yes    Birth control/protection: None  Other Topics Concern   Not on file  Social History Narrative   Patient lives at home alone.    Patient works for [employer]   Patient has some college education.   Right handed.      Patient now works for [employer] and has received an associated an degree.   Caffeine 16 oz daily   One story home  Social Determinants of Health   Financial Resource Strain: Not on file  Food Insecurity: No Food Insecurity (08/02/2021)   Hunger Vital Sign    Worried About Running Out of Food in the Last Year: Never true    Ran Out of Food in the Last Year: Never true  Transportation Needs: No Transportation Needs (08/02/2021)   PRAPARE - Administrator, Civil Service (Medical): No    Lack of Transportation (Non-Medical): No  Physical Activity: Not on file  Stress: Not on file  Social Connections: Not on file  Intimate Partner Violence: Not on file    Review of Systems  All other systems reviewed and are negative.       Objective    BP 111/73   Pulse 60   Temp 97.7 F (36.5 C) (Oral)   Resp 16   Wt 179 lb 12.8 oz (81.6 kg)   LMP 09/16/2022 (Exact Date)   SpO2 98%   BMI 35.11 kg/m   Physical  Exam Vitals and nursing note reviewed.  Constitutional:      General: She is not in acute distress. HENT:     Head: Normocephalic and atraumatic.  Cardiovascular:     Rate and Rhythm: Normal rate and regular rhythm.  Pulmonary:     Effort: Pulmonary effort is normal.     Breath sounds: Normal breath sounds.  Neurological:     General: No focal deficit present.     Mental Status: She is alert and oriented to person, place, and time.  Psychiatric:        Mood and Affect: Mood is depressed. Affect is tearful.        Behavior: Behavior normal.         Assessment & Plan:   1. Depression, unspecified depression type Continue present management. Keep scheduled appt with consultant fo further eval/mgt  2. Encounter for completion of form with patient Completion of FMLA     No follow-ups on file.   Tommie Raymond, MD

## 2022-10-23 NOTE — Progress Notes (Signed)
Spoke with patient to discuss recent lumbar MRI imaging, there are multi level degenerative changes, epidural lipomatosis noted at L5-S1. No spinal canal stenosis noted. Patient recently started regimen of formal physical therapy. States her pain is much more controlled at this time. I offered possibility of lumbar epidural steroid injection, however she would like to hold as her pain seems to be mild.

## 2022-10-24 ENCOUNTER — Other Ambulatory Visit: Payer: Self-pay | Admitting: Family Medicine

## 2022-10-24 NOTE — Telephone Encounter (Signed)
Requested Prescriptions  Refused Prescriptions Disp Refills   sertraline (ZOLOFT) 50 MG tablet [Pharmacy Med Name: SERTRALINE HCL 50 MG TABLET] 90 tablet 0    Sig: TAKE 1 TABLET BY MOUTH EVERY DAY     Psychiatry:  Antidepressants - SSRI - sertraline Failed - 10/24/2022 12:15 AM      Failed - AST in normal range and within 360 days    AST  Date Value Ref Range Status  08/28/2021 20 15 - 41 U/L Final         Failed - ALT in normal range and within 360 days    ALT  Date Value Ref Range Status  08/28/2021 14 0 - 44 U/L Final         Passed - Completed PHQ-2 or PHQ-9 in the last 360 days      Passed - Valid encounter within last 6 months    Recent Outpatient Visits           2 days ago Depression, unspecified depression type   [place] [facility], Lauris Poag, MD   3 weeks ago Chronic right-sided low back pain, unspecified whether sciatica present   [place] [facility], MD   2 months ago Depression, unspecified depression type   [place] [facility], Lauris Poag, MD   4 months ago Migraine without aura and without status migrainosus, not intractable   [place] [facility] at [facility], MD   5 years ago Generalized anxiety disorder   [facility], Levell July, MD       Future Appointments             In 2 weeks Georganna Skeans, MD [facility]   In 3 weeks Juanda Chance, NP [facility]

## 2022-10-28 ENCOUNTER — Ambulatory Visit: Payer: BC Managed Care – PPO | Admitting: Neurology

## 2022-10-30 ENCOUNTER — Other Ambulatory Visit: Payer: Self-pay | Admitting: Physical Medicine and Rehabilitation

## 2022-11-13 ENCOUNTER — Ambulatory Visit (INDEPENDENT_AMBULATORY_CARE_PROVIDER_SITE_OTHER): Payer: BC Managed Care – PPO | Admitting: Family Medicine

## 2022-11-13 VITALS — BP 101/66 | HR 70 | Temp 98.1°F | Resp 16 | Wt 183.2 lb

## 2022-11-13 DIAGNOSIS — K219 Gastro-esophageal reflux disease without esophagitis: Secondary | ICD-10-CM

## 2022-11-13 DIAGNOSIS — F32A Depression, unspecified: Secondary | ICD-10-CM | POA: Diagnosis not present

## 2022-11-13 MED ORDER — FAMOTIDINE 40 MG PO TABS: 40.0000 mg | ORAL_TABLET | Freq: Every day | ORAL | 1 refills | Status: DC

## 2022-11-14 ENCOUNTER — Other Ambulatory Visit (HOSPITAL_COMMUNITY): Admission: RE | Admit: 2022-11-14 | Payer: BC Managed Care – PPO | Source: Ambulatory Visit | Admitting: Family Medicine

## 2022-11-14 ENCOUNTER — Encounter: Payer: Self-pay | Admitting: Family Medicine

## 2022-11-14 ENCOUNTER — Ambulatory Visit: Payer: BC Managed Care – PPO | Admitting: Physical Medicine and Rehabilitation

## 2022-11-14 ENCOUNTER — Ambulatory Visit (INDEPENDENT_AMBULATORY_CARE_PROVIDER_SITE_OTHER): Payer: BC Managed Care – PPO | Admitting: Family Medicine

## 2022-11-14 ENCOUNTER — Other Ambulatory Visit: Payer: Self-pay

## 2022-11-14 VITALS — BP 128/83 | HR 87 | Ht 60.0 in | Wt 180.6 lb

## 2022-11-14 DIAGNOSIS — Z124 Encounter for screening for malignant neoplasm of cervix: Secondary | ICD-10-CM | POA: Insufficient documentation

## 2022-11-14 DIAGNOSIS — F411 Generalized anxiety disorder: Secondary | ICD-10-CM

## 2022-11-14 NOTE — Progress Notes (Signed)
Established Patient Office Visit  Subjective    Patient ID: Tracy Cohen, female    DOB: [birth year]  Age: 32 y.o. MRN: [id number]  CC: No chief complaint on file.   HPI [name] presents for follow up of chronic med issues.    Outpatient Encounter Medications as of 11/13/2022  Medication Sig   famotidine (PEPCID) 40 MG tablet Take 1 tablet (40 mg total) by mouth daily.   Fremanezumab-vfrm (AJOVY) 225 MG/1.5ML SOAJ Inject 225 mg into the skin every 28 (twenty-eight) days.   gabapentin (NEURONTIN) 300 MG capsule Take 1 capsule (300 mg total) by mouth 2 (two) times daily.   meloxicam (MOBIC) 15 MG tablet TAKE 1 TABLET (15 MG TOTAL) BY MOUTH DAILY.   methocarbamol (ROBAXIN) 500 MG tablet Take 1 tablet (500 mg total) by mouth 3 (three) times daily.   ondansetron (ZOFRAN-ODT) 4 MG disintegrating tablet Take 1 tablet (4 mg total) by mouth every 8 (eight) hours as needed for nausea or vomiting.   Rimegepant Sulfate (NURTEC) 75 MG TBDP Take 1 tablet by mouth daily as needed (Maximum 1 tablet in 24 hours).   sertraline (ZOLOFT) 100 MG tablet Take 1 tablet (100 mg total) by mouth at bedtime. (Patient not taking: Reported on 11/14/2022)   SUMAtriptan (IMITREX) 20 MG/ACT nasal spray Place 1 spray (20 mg total) into the nose as needed for migraine or headache. May repeat in 2 hours if headache persists or recurs.  Maximum 2 sprays in 24 hours.   No facility-administered encounter medications on file as of 11/13/2022.    Past Medical History:  Diagnosis Date   Allergy    Anxiety    Depression    Migraine     Past Surgical History:  Procedure Laterality Date   ADENOIDECTOMY     TONSILLECTOMY      Family History  Problem Relation Age of Onset   High blood pressure Mother    Mental illness Father        schizophrenia   Esophageal cancer Father    Stroke Maternal Grandmother    Mental illness Maternal Grandmother        bipolar   Kidney cancer Maternal Grandmother    Cancer  Maternal Grandfather    Prostate cancer Maternal Grandfather    Colon cancer Maternal Grandfather    Mental illness Paternal Grandmother    Cancer Paternal Grandmother    Kidney cancer Paternal Grandmother    Mental illness Paternal Grandfather     Social History   Socioeconomic History   Marital status: Single    Spouse name: Not on file   Number of children: 0   Years of education: 13   Highest education level: Associate degree: occupational, Scientist, product/process development, or vocational program  Occupational History    Employer: [employer]  Tobacco Use   Smoking status: Former    Types: Cigarettes   Smokeless tobacco: Never  Vaping Use   Vaping status: Never Used  Substance and Sexual Activity   Alcohol use: Not Currently    Comment: 6/week (on weekend)   Drug use: Not Currently   Sexual activity: Yes    Birth control/protection: None  Other Topics Concern   Not on file  Social History Narrative   Patient lives at home alone.    Patient works for [employer]   Patient has some college education.   Right handed.      Patient now works for [employer] and has received an associated an degree.  Caffeine 16 oz daily   One story home      Social Determinants of Health   Financial Resource Strain: Not on file  Food Insecurity: No Food Insecurity (08/02/2021)   Hunger Vital Sign    Worried About Running Out of Food in the Last Year: Never true    Ran Out of Food in the Last Year: Never true  Transportation Needs: No Transportation Needs (08/02/2021)   PRAPARE - Administrator, Civil Service (Medical): No    Lack of Transportation (Non-Medical): No  Physical Activity: Not on file  Stress: Not on file  Social Connections: Not on file  Intimate Partner Violence: Not on file    Review of Systems  All other systems reviewed and are negative.       Objective    BP 101/66   Pulse 70   Temp 98.1 F (36.7 C) (Oral)   Resp 16   Wt 183 lb 3.2 oz (83.1 kg)   LMP  09/16/2022 (Exact Date)   SpO2 98%   BMI 35.78 kg/m   Physical Exam Vitals and nursing note reviewed.  Constitutional:      General: She is not in acute distress. HENT:     Head: Normocephalic and atraumatic.  Cardiovascular:     Rate and Rhythm: Normal rate and regular rhythm.  Pulmonary:     Effort: Pulmonary effort is normal.     Breath sounds: Normal breath sounds.  Neurological:     General: No focal deficit present.     Mental Status: She is alert and oriented to person, place, and time.  Psychiatric:        Mood and Affect: Mood and affect normal.        Behavior: Behavior normal.         Assessment & Plan:   1. Depression, unspecified depression type Improved with present management. Continue   2. Gastroesophageal reflux disease, unspecified whether esophagitis present Pepcid prescribed. Discussed dietary and activity options     Return in about 3 weeks (around 12/04/2022) for follow up.   Tracy Raymond, MD

## 2022-11-14 NOTE — Assessment & Plan Note (Signed)
Ongoing debilitating symptoms interfering with daily life. Has psychiatry appt in two weeks and then is back with daughter Hermes for [facility] the next day, will follow up with her then. Also schedule two month follow up.  Due for pap, collected today.

## 2022-11-14 NOTE — Progress Notes (Signed)
[facility] OFFICE VISIT NOTE  History:   Tracy Cohen is a 32 y.o. G2P1011 here today for depression follow up.  Seen on 10/15/2022, at that time increased sertraline to 100 mg daily and stated gabapentin 300 mg BID Referred to psych  Today reports she stopped taking sertraline and gabapentin as she did not feel it was having any benefit Has appt with psychiatry on 11/28/2022 Also had FMLA paperwork denied, she is working with her PCP but likely will need to follow up with psychiatry Doing better with bills  Flowsheet Row Office Visit from 11/14/2022 in [facility] at [facility]  PHQ-9 Total Score 15         11/14/2022   10:40 AM 10/15/2022    2:49 PM 10/02/2022    9:53 AM 06/26/2022    3:18 PM  GAD 7 : Generalized Anxiety Score  Nervous, Anxious, on Edge 3 3 0 3  Control/stop worrying 3 3 0 3  Worry too much - different things 3 3 0 3  Trouble relaxing 3 3 0 3  Restless 2 3 0 3  Easily annoyed or irritable 3 1 0 3  Afraid - awful might happen 1 3 0 1  Total GAD 7 Score 18 19 0 19  Anxiety Difficulty Not difficult at all Very difficult Not difficult at all Somewhat difficult      Health Maintenance Due  Topic Date Due   PAP-Cervical Cytology Screening  10/18/2022    Past Medical History:  Diagnosis Date   Allergy    Anxiety    Depression    Migraine     Past Surgical History:  Procedure Laterality Date   ADENOIDECTOMY     TONSILLECTOMY      The following portions of the patient's history were reviewed and updated as appropriate: allergies, current medications, past family history, past medical history, past social history, past surgical history and problem list.   Health Maintenance:   Last pap: No results found for: "DIAGPAP", "HPV", "HPVHIGH" *collected today*  Last mammogram:  N/a    Review of Systems:  Pertinent items noted in HPI and remainder of comprehensive ROS otherwise negative.  Physical  Exam:  BP 128/83   Pulse 87   Ht 5' (1.524 m)   Wt 180 lb 9.6 oz (81.9 kg)   LMP 09/16/2022 (Exact Date)   Breastfeeding No   BMI 35.27 kg/m  CONSTITUTIONAL: Well-developed, well-nourished female in no acute distress.  HEENT:  Normocephalic, atraumatic. External right and left ear normal. No scleral icterus.  NECK: Normal range of motion, supple, no masses noted on observation SKIN: No rash noted. Not diaphoretic. No erythema. No pallor. MUSCULOSKELETAL: Normal range of motion. No edema noted. NEUROLOGIC: Alert and oriented to person, place, and time. Normal muscle tone coordination.  PSYCHIATRIC: anxious affect, depressed mood RESPIRATORY: Effort normal, no problems with respiration noted   Labs and Imaging No results found for this or any previous visit (from the past 168 hour(s)). MR LUMBAR SPINE WO CONTRAST  Result Date: 10/23/2022 CLINICAL DATA:  Initial evaluation for low back pain. EXAM: MRI LUMBAR SPINE WITHOUT CONTRAST TECHNIQUE: Multiplanar, multisequence MR imaging of the lumbar spine was performed. No intravenous contrast was administered. COMPARISON:  Prior radiograph from 10/03/2022. FINDINGS: Segmentation: Standard. Lowest well-formed disc space labeled the L5-S1 level. Alignment: Trace degenerative anterolisthesis of L5 on S1. Alignment otherwise normal with preservation of the normal lumbar lordosis. Vertebrae: Vertebral body height maintained without  acute or chronic fracture. Diffuse loss of normal bone marrow signal, nonspecific but can be seen with anemia, smoking, obesity, and infiltrative/myelofibrotic marrow processes. Few scattered subcentimeter benign hemangiomata noted. No worrisome osseous lesions. Mild reactive endplate change with marrow edema present about the L5-S1 interspace. Minimal reactive endplate changes noted elsewhere within the lumbar spine. Conus medullaris and cauda equina: Conus extends to the L1 level. Conus and cauda equina appear normal.  Paraspinal and other soft tissues: Unremarkable. Disc levels: L1-2:  Unremarkable. L2-3:  Unremarkable. L3-4: Normal interspace. Mild bilateral facet hypertrophy. No spinal stenosis. Foramina remain patent. L4-5: Mild reactive endplate change without significant disc bulge. Mild bilateral facet hypertrophy. No significant spinal stenosis. Foramina remain patent. L5-S1: Disc desiccation with mild diffuse disc bulge. Associated mild reactive endplate change. Mild bilateral facet hypertrophy. Epidural lipomatosis. No significant spinal stenosis. Foramina remain patent. IMPRESSION: 1. Mild degenerative disc bulging at L5-S1 without significant stenosis or neural impingement. 2. Mild bilateral facet hypertrophy at L3-4 through L5-S1. Electronically Signed   By: Rise Mu M.D.   On: 10/23/2022 09:05      Assessment and Plan:   Problem List Items Addressed This Visit       Other   GAD (generalized anxiety disorder) - Primary    Ongoing debilitating symptoms interfering with daily life. Has psychiatry appt in two weeks and then is back with daughter Hermes for [facility] the next day, will follow up with her then. Also schedule two month follow up.  Due for pap, collected today.       Other Visit Diagnoses     Screening for cervical cancer       Relevant Orders   Cytology - PAP( [place])       Routine preventative health maintenance measures emphasized. Please refer to After Visit Summary for other counseling recommendations.   Return in about 2 months (around 01/15/2023) for [facility] patient, mood check.    Total face-to-face time with patient: 20 minutes.  Over 50% of encounter was spent on counseling and coordination of care.   Venora Maples, MD/MPH Attending Family Medicine Physician, [facility] for [facility], [facility]

## 2022-11-18 LAB — CYTOLOGY - PAP
Comment: NEGATIVE
Diagnosis: NEGATIVE
High risk HPV: NEGATIVE

## 2022-11-28 ENCOUNTER — Encounter (HOSPITAL_COMMUNITY): Payer: Self-pay | Admitting: Psychiatry

## 2022-11-28 ENCOUNTER — Telehealth (HOSPITAL_COMMUNITY): Payer: Self-pay | Admitting: Psychiatry

## 2022-11-28 ENCOUNTER — Ambulatory Visit (HOSPITAL_BASED_OUTPATIENT_CLINIC_OR_DEPARTMENT_OTHER): Payer: BC Managed Care – PPO | Admitting: Psychiatry

## 2022-11-28 VITALS — Wt 183.0 lb

## 2022-11-28 DIAGNOSIS — F419 Anxiety disorder, unspecified: Secondary | ICD-10-CM

## 2022-11-28 DIAGNOSIS — F3130 Bipolar disorder, current episode depressed, mild or moderate severity, unspecified: Secondary | ICD-10-CM | POA: Diagnosis not present

## 2022-11-28 MED ORDER — HYDROXYZINE HCL 25 MG PO TABS
25.0000 mg | ORAL_TABLET | Freq: Two times a day (BID) | ORAL | 0 refills | Status: DC | PRN
Start: 2022-11-28 — End: 2023-01-01

## 2022-11-28 NOTE — Progress Notes (Signed)
Psychiatric Initial Adult Assessment    Virtual Visit via Video Note  I connected with Tracy Cohen on 11/28/22 at  9:00 AM EDT by a video enabled telemedicine application and verified that I am speaking with the correct person using two identifiers.  Location: Patient: Home Provider: Office   I discussed the limitations of evaluation and management by telemedicine and the availability of in person appointments. The patient expressed understanding and agreed to proceed.  Patient Identification: Tracy Cohen MRN:  [id number] Date of Evaluation:  11/28/2022 Referral Source: OBGYN Chief Complaint:   Chief Complaint  Patient presents with   Anxiety   Establish Care   Agitation   Visit Diagnosis:    ICD-10-CM   1. Bipolar I disorder, most recent episode depressed (HCC)  F31.30 hydrOXYzine (ATARAX) 25 MG tablet    2. Anxiety  F41.9 hydrOXYzine (ATARAX) 25 MG tablet      History of Present Illness: Patient is 32 year old African-American, single, employed female who is referred from primary care for the management of her psychiatric symptoms.  Patient reported to struggle with severe anxiety, depression, irritability, mood swing and anger.  She reported have these symptoms for a while however in past 1 year it has been intensified.  Patient has a 1-year-old baby and she is a single parent.  She reported a lot of financial distress and limited support.  She is out of work since June 11 because could not function at work.  Her paperwork is done by her OB/GYN but referred to see psychiatrist.  During the session she was tearful and very emotional.  She reported significant irritability, anger issue, mood swings.  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  In the past her job was working well but now she is going to call center.  She admitted sometimes very combative and irrational with the customers.  She described these episodes as "manic episodes".  She admitted easily  irritable, upset and get into argument with supervisor and customers.  She admitted sometimes feels that she is in a dark hole and reported worthless, run away from the situation.  She denies any active suicidal thoughts but admitted hopelessness and negative thoughts.  She only sleeping 3 to 4 hours.  She feels her mood is like a roller coaster.  She has limited support.  Patient told father of the baby is not in the picture.  She has some support from her sister and mother who lives close by.  She denies any hallucination, paranoia, homicidal thoughts.  She admitted to drink alcohol only on Fridays when her sister watch the baby and she can enjoy time with her friends.  She admitted a lot of anxiety, worries and nervousness about the future.  She is currently not in any therapy but like her symptoms to get addressed.  Her appetite is okay.  Energy level is decreased.  She denies any illegal substance use.  Her PCP tried Zoloft and recently gabapentin but she did not notice any difference.  She took the Zoloft up to 100 mg a day.  Now she is taking REXULTI 1 mg samples given by PCP.  She was told to take 2 mg after 7 days.  So far she has not noticed any significant improvement.  Patient currently out of work and is scheduled to go back to work on August 1.  She does not feel she is ready at this time.  She is having crying spells, anxiety and sometimes  she feels that she is living in dark clothes.  Reviewed recent PHQ, GAD scales and screenings  Associated Signs/Symptoms: Depression Symptoms:  depressed mood, anhedonia, insomnia, psychomotor agitation, hopelessness, anxiety, disturbed sleep, (Hypo) Manic Symptoms:  Impulsivity, Irritable Mood, Labiality of Mood, Anxiety Symptoms:  Excessive Worry, Social Anxiety, Psychotic Symptoms:   denies PTSD Symptoms: Negative  Past Psychiatric History: History of seeking help in 05-Jan-2020 when her grandmother died.  She was seeing at the office at [facility]  but do not remember the details.  She remembered being prescribed Xanax.  She was given Zoloft and gabapentin from PCP which she stopped after do not feel any improvement.  No history of suicidal attempt or inpatient treatment.  History of mood swings, impulsive behavior, anger, irritability and anxiety.  Previous Psychotropic Medications: Yes   Substance Abuse History in the last 12 months:  No.  Consequences of Substance Abuse: Negative  Past Medical History:  Past Medical History:  Diagnosis Date   Allergy    Anxiety    Depression    Migraine     Past Surgical History:  Procedure Laterality Date   ADENOIDECTOMY     TONSILLECTOMY      Family Psychiatric History: Father has bipolar disorder.  Family History:  Family History  Problem Relation Age of Onset   High blood pressure Mother    Mental illness Father        schizophrenia   Esophageal cancer Father    Stroke Maternal Grandmother    Mental illness Maternal Grandmother        bipolar   Kidney cancer Maternal Grandmother    Cancer Maternal Grandfather    Prostate cancer Maternal Grandfather    Colon cancer Maternal Grandfather    Mental illness Paternal Grandmother    Cancer Paternal Grandmother    Kidney cancer Paternal Grandmother    Mental illness Paternal Grandfather     Social History:   Social History   Socioeconomic History   Marital status: Single    Spouse name: Not on file   Number of children: 0   Years of education: 13   Highest education level: Tax adviser degree: occupational, Scientist, product/process development, or vocational program  Occupational History    Employer: [employer]  Tobacco Use   Smoking status: Former    Types: Cigarettes   Smokeless tobacco: Never  Vaping Use   Vaping status: Never Used  Substance and Sexual Activity   Alcohol use: Not Currently    Comment: 6/week (on weekend)   Drug use: Not Currently   Sexual activity: Yes    Birth control/protection: None  Other Topics Concern   Not  on file  Social History Narrative   Patient lives at home alone.    Patient works for [employer]   Patient has some college education.   Right handed.      Patient now works for [employer] and has received an associated an degree.   Caffeine 16 oz daily   One story home      Social Determinants of Health   Financial Resource Strain: Not on file  Food Insecurity: No Food Insecurity (08/02/2021)   Hunger Vital Sign    Worried About Running Out of Food in the Last Year: Never true    Ran Out of Food in the Last Year: Never true  Transportation Needs: No Transportation Needs (08/02/2021)   PRAPARE - Administrator, Civil Service (Medical): No    Lack of Transportation (Non-Medical): No  Physical Activity: Not on file  Stress: Not on file  Social Connections: Not on file    Additional Social History: Patient born and raised in [place].  She finished high school and did associate from [employer].  She did online courses.  She is working for [employer] [place] in the past 6 years.  She reported parents are separated.  Father is older and had dementia.  Patient has 2 brothers and they live out of town.  Patient's sister and mother live close by.  Patient has a 24-year-old baby but father of the baby is not in the picture.  Allergies:  No Known Allergies  Metabolic Disorder Labs: Lab Results  Component Value Date   HGBA1C 5.8 (H) 02/27/2021   No results found for: "PROLACTIN" No results found for: "CHOL", "TRIG", "HDL", "CHOLHDL", "VLDL", "LDLCALC" Lab Results  Component Value Date   TSH 0.617 06/04/2018    Therapeutic Level Labs: No results found for: "LITHIUM" No results found for: "CBMZ" No results found for: "VALPROATE"  Current Medications: Current Outpatient Medications  Medication Sig Dispense Refill   famotidine (PEPCID) 40 MG tablet Take 1 tablet (40 mg total) by mouth daily. 90 tablet 1   Fremanezumab-vfrm (AJOVY) 225  MG/1.5ML SOAJ Inject 225 mg into the skin every 28 (twenty-eight) days. 1.68 mL 11   gabapentin (NEURONTIN) 300 MG capsule Take 1 capsule (300 mg total) by mouth 2 (two) times daily. 60 capsule 2   meloxicam (MOBIC) 15 MG tablet TAKE 1 TABLET (15 MG TOTAL) BY MOUTH DAILY. 30 tablet 0   methocarbamol (ROBAXIN) 500 MG tablet Take 1 tablet (500 mg total) by mouth 3 (three) times daily. 90 tablet 0   ondansetron (ZOFRAN-ODT) 4 MG disintegrating tablet Take 1 tablet (4 mg total) by mouth every 8 (eight) hours as needed for nausea or vomiting. 20 tablet 5   Rimegepant Sulfate (NURTEC) 75 MG TBDP Take 1 tablet by mouth daily as needed (Maximum 1 tablet in 24 hours). 8 tablet 11   sertraline (ZOLOFT) 100 MG tablet Take 1 tablet (100 mg total) by mouth at bedtime. (Patient not taking: Reported on 11/14/2022) 90 tablet 3   SUMAtriptan (IMITREX) 20 MG/ACT nasal spray Place 1 spray (20 mg total) into the nose as needed for migraine or headache. May repeat in 2 hours if headache persists or recurs.  Maximum 2 sprays in 24 hours. 6 each 5   No current facility-administered medications for this visit.    Musculoskeletal: Strength & Muscle Tone: within normal limits Gait & Station: normal Patient leans: N/A  Psychiatric Specialty Exam: Review of Systems  Musculoskeletal:  Positive for back pain.  Psychiatric/Behavioral:  Positive for behavioral problems, dysphoric mood and sleep disturbance. The patient is nervous/anxious.     Weight 183 lb (83 kg), not currently breastfeeding.There is no height or weight on file to calculate BMI.  General Appearance: Casual and tearful  Eye Contact:  Fair  Speech:  Slow  Volume:  Decreased  Mood:  Anxious, Depressed, Dysphoric, and Irritable  Affect:  Constricted and Depressed  Thought Process:  Descriptions of Associations: Intact  Orientation:  Full (Time, Place, and Person)  Thought Content:  Rumination  Suicidal Thoughts:  No  Homicidal Thoughts:  No  Memory:   Immediate;   Good Recent;   Good Remote;   Good  Judgement:  Intact  Insight:  Present  Psychomotor Activity:  Decreased  Concentration:  Concentration: Fair and Attention Span: Fair  Recall:  Good  Fund of Knowledge:Good  Language: Good  Akathisia:  No  Handed:  Right  AIMS (if indicated):  not done  Assets:  Communication Skills Desire for Improvement Housing Talents/Skills Transportation  ADL's:  Intact  Cognition: WNL  Sleep:  Poor   Screenings: GAD-7    Flowsheet Row Office Visit from 11/14/2022 in [place] at [facility] Office Visit from 10/15/2022 in [place] [facility] at [facility] Office Visit from 10/02/2022 in [facility] at [facility] Office Visit from 06/26/2022 in [facility] at [facility] from [facility]/04/[facility]  Total GAD-7 Score 18 19 0 19 4      PHQ2-9    Flowsheet Row Office Visit from 11/14/2022 in [place] [facility] at [facility] Office Visit from 10/15/2022 in [place] [facility] at [facility] Office Visit from 10/02/2022 in [facility] Care at [facility] Office Visit from 06/26/2022 in [facility] Care at [facility] from 10/04/2021 in [facility] at [facility]  PHQ-2 Total Score 4 2 0 1 2  PHQ-9 Total Score 15 9 6 4 8       Flowsheet Row Admission (Discharged) from 06/20/2021 in [facility]S M[facility] Assessment Unit Admission (Discharged) from 05/11/2021 in [facility]S Maternity Assessment Unit ED to Hosp-Admission (Discharged) from 01/29/2021 in [facility]S Maternity Assessment Unit  C-SSRS RISK CATEGORY No Risk No Risk No Risk       Assessment and Plan: I reviewed collateral information, psychosocial, current medication, blood work results.  It is a 32 year old with a history of mood symptoms,  anxiety.  PCP prescribed Zoloft and gabapentin that did not work.  Now she is taking REXULTI samples 1 mg given by primary care.  It is only 7 days but patient has not seen significant improvement.  Patient not doing breast-feeding the baby.  We discussed in detail about treatment plan.  She is out of work since June 11 and not ready to go back to work.  I offered IOP to consider to help her coping skills.  Patient agreed to give a try.  Since she just started taking REXULTI I will not discontinue the medicine as did not have enough time.  However we will provide low-dose hydroxyzine 25 mg that she can take to help with anxiety and sleep.  We discussed medication side effects especially sedation.  She is taking care of 58-year-old baby and potential side effects need to be monitored.  If REXULTI did not work then we can try either Lamictal or Abilify.  If patient decided to IOP we will extend her out of work papers until she finished the program.  We will refer to Jeri Modena who is a Teaching laboratory technician for IOP.  Discussed safety concern that anytime having active suicidal thoughts or homicidal thought that she need to call 9 1 emergency room.  I did talk about patient's underlying possible diagnosis and patient agreed and accepted that she may have mood symptoms given the chronic and longitudinal symptoms of her illness.  We will follow-up once she finished the program.  Collaboration of Care: Other provider involved in patient's care AEB notes are available in epic to review.  I will also forward my note to her PCP.  Patient/Guardian was advised Release of Information must  be obtained prior to any record release in order to collaborate their care with an outside provider. Patient/Guardian was advised if they have not already done so to contact the registration department to sign all necessary forms in order for [place] to release information regarding their care.   Consent: Patient/Guardian gives verbal consent  for treatment and assignment of benefits for services provided during this visit. Patient/Guardian expressed understanding and agreed to proceed.    Follow Up Instructions:    I discussed the assessment and treatment plan with the patient. The patient was provided an opportunity to ask questions and all were answered. The patient agreed with the plan and demonstrated an understanding of the instructions.   The patient was advised to call back or seek an in-person evaluation if the symptoms worsen or if the condition fails to improve as anticipated.  I provided 66 minutes of non-face-to-face time during this encounter.   Cleotis Nipper, MD 7/25/20248:57 AM

## 2022-11-29 ENCOUNTER — Telehealth (HOSPITAL_COMMUNITY): Payer: Self-pay | Admitting: *Deleted

## 2022-11-29 NOTE — Telephone Encounter (Signed)
FMLA paperwork completed and faxed to [facility] [employer] for pt employer [employer].

## 2022-12-04 ENCOUNTER — Encounter: Payer: Self-pay | Admitting: Family Medicine

## 2022-12-04 ENCOUNTER — Ambulatory Visit: Payer: BC Managed Care – PPO | Admitting: Family Medicine

## 2022-12-04 VITALS — BP 106/71 | HR 65 | Temp 98.1°F | Resp 16

## 2022-12-04 DIAGNOSIS — F411 Generalized anxiety disorder: Secondary | ICD-10-CM | POA: Diagnosis not present

## 2022-12-04 DIAGNOSIS — F32A Depression, unspecified: Secondary | ICD-10-CM | POA: Diagnosis not present

## 2022-12-04 MED ORDER — BREXPIPRAZOLE 2 MG PO TABS: 1.0000 mg | ORAL_TABLET | Freq: Every day | ORAL | 0 refills | Status: DC

## 2022-12-04 NOTE — Progress Notes (Signed)
Established Patient Office Visit  Subjective    Patient ID: Tracy Cohen, female    DOB: 1991/04/12  Age: 32 y.o. MRN: [id number]  CC:  Chief Complaint  Patient presents with   Follow-up    HPI Tracy Cohen presents for follow up of depression. She reports that she is improved.   Outpatient Encounter Medications as of 12/04/2022  Medication Sig   famotidine (PEPCID) 40 MG tablet Take 1 tablet (40 mg total) by mouth daily.   Fremanezumab-vfrm (AJOVY) 225 MG/1.5ML SOAJ Inject 225 mg into the skin every 28 (twenty-eight) days.   hydrOXYzine (ATARAX) 25 MG tablet Take 1 tablet (25 mg total) by mouth 2 (two) times daily as needed for anxiety.   Rimegepant Sulfate (NURTEC) 75 MG TBDP Take 1 tablet by mouth daily as needed (Maximum 1 tablet in 24 hours).   sertraline (ZOLOFT) 100 MG tablet Take 1 tablet (100 mg total) by mouth at bedtime.   SUMAtriptan (IMITREX) 20 MG/ACT nasal spray Place 1 spray (20 mg total) into the nose as needed for migraine or headache. May repeat in 2 hours if headache persists or recurs.  Maximum 2 sprays in 24 hours.   [DISCONTINUED] brexpiprazole (REXULTI) 2 MG TABS tablet Take 1 mg by mouth daily. Take 2 mg after seven days as directed by PCP   brexpiprazole (REXULTI) 2 MG TABS tablet Take 0.5 tablets (1 mg total) by mouth daily. Take 2 mg after seven days as directed by PCP   gabapentin (NEURONTIN) 300 MG capsule Take 1 capsule (300 mg total) by mouth 2 (two) times daily. (Patient not taking: Reported on 11/28/2022)   meloxicam (MOBIC) 15 MG tablet TAKE 1 TABLET (15 MG TOTAL) BY MOUTH DAILY. (Patient not taking: Reported on 11/28/2022)   methocarbamol (ROBAXIN) 500 MG tablet Take 1 tablet (500 mg total) by mouth 3 (three) times daily. (Patient not taking: Reported on 11/28/2022)   ondansetron (ZOFRAN-ODT) 4 MG disintegrating tablet Take 1 tablet (4 mg total) by mouth every 8 (eight) hours as needed for nausea or vomiting. (Patient not taking: Reported on  11/28/2022)   No facility-administered encounter medications on file as of 12/04/2022.    Past Medical History:  Diagnosis Date   Allergy    Anxiety    Depression    Migraine     Past Surgical History:  Procedure Laterality Date   ADENOIDECTOMY     TONSILLECTOMY      Family History  Problem Relation Age of Onset   High blood pressure Mother    Mental illness Father        schizophrenia   Esophageal cancer Father    Stroke Maternal Grandmother    Mental illness Maternal Grandmother        bipolar   Kidney cancer Maternal Grandmother    Cancer Maternal Grandfather    Prostate cancer Maternal Grandfather    Colon cancer Maternal Grandfather    Mental illness Paternal Grandmother    Cancer Paternal Grandmother    Kidney cancer Paternal Grandmother    Mental illness Paternal Grandfather     Social History   Socioeconomic History   Marital status: Single    Spouse name: Not on file   Number of children: 0   Years of education: 13   Highest education level: Associate degree: occupational, Scientist, product/process development, or vocational program  Occupational History    Employer: [employer]  Tobacco Use   Smoking status: Former    Types: Cigarettes   Smokeless  tobacco: Never  Vaping Use   Vaping status: Never Used  Substance and Sexual Activity   Alcohol use: Not Currently    Comment: 6/week (on weekend)   Drug use: Not Currently   Sexual activity: Yes    Birth control/protection: None  Other Topics Concern   Not on file  Social History Narrative   Patient lives at home alone.    Patient works for [employer]   Patient has some college education.   Right handed.      Patient now works for [employer] and has received an associated an degree.   Caffeine 16 oz daily   One story home      Social Determinants of Health   Financial Resource Strain: Not on file  Food Insecurity: No Food Insecurity (08/02/2021)   Hunger Vital Sign    Worried About Running Out of Food in the Last  Year: Never true    Ran Out of Food in the Last Year: Never true  Transportation Needs: No Transportation Needs (08/02/2021)   PRAPARE - Administrator, Civil Service (Medical): No    Lack of Transportation (Non-Medical): No  Physical Activity: Not on file  Stress: Not on file  Social Connections: Not on file  Intimate Partner Violence: Not on file    Review of Systems  Psychiatric/Behavioral:  Positive for depression. Negative for suicidal ideas. The patient is not nervous/anxious.   All other systems reviewed and are negative.       Objective    BP 106/71   Pulse 65   Temp 98.1 F (36.7 C) (Oral)   Resp 16   SpO2 98%   Physical Exam Vitals and nursing note reviewed.  Constitutional:      General: She is not in acute distress. HENT:     Head: Normocephalic and atraumatic.  Cardiovascular:     Rate and Rhythm: Normal rate and regular rhythm.  Pulmonary:     Effort: Pulmonary effort is normal.     Breath sounds: Normal breath sounds.  Neurological:     General: No focal deficit present.     Mental Status: She is alert and oriented to person, place, and time.  Psychiatric:        Mood and Affect: Mood and affect normal.        Behavior: Behavior normal.         Assessment & Plan:   1. Depression, unspecified depression type Doing well. Will decrease zoloft to 50 mg and continue rexulti 2 mg  2. GAD (generalized anxiety disorder) As above    Return in about 3 months (around 03/06/2023) for follow up.   Tommie Raymond, MD

## 2022-12-04 NOTE — Progress Notes (Signed)
Patient is here for f/u depression. Patient said she is doing well today no concerns

## 2022-12-06 ENCOUNTER — Other Ambulatory Visit (HOSPITAL_COMMUNITY): Payer: Self-pay | Admitting: Psychiatry

## 2022-12-06 DIAGNOSIS — F3130 Bipolar disorder, current episode depressed, mild or moderate severity, unspecified: Secondary | ICD-10-CM

## 2022-12-06 DIAGNOSIS — F419 Anxiety disorder, unspecified: Secondary | ICD-10-CM

## 2022-12-25 ENCOUNTER — Telehealth: Payer: Self-pay | Admitting: Family Medicine

## 2022-12-25 NOTE — Telephone Encounter (Signed)
Copied from CRM [id number]. Topic: General - Other >> Dec 25, 2022  9:54 AM [place] B wrote: Reason for CRM: Dr Verdene Rio called in about info for pt to get disability. He states the info in records is inadequate. Please call back to discuss

## 2023-01-01 ENCOUNTER — Encounter (HOSPITAL_COMMUNITY): Payer: Self-pay | Admitting: Psychiatry

## 2023-01-01 ENCOUNTER — Telehealth (HOSPITAL_COMMUNITY): Payer: Self-pay | Admitting: Psychiatry

## 2023-01-01 ENCOUNTER — Telehealth (HOSPITAL_BASED_OUTPATIENT_CLINIC_OR_DEPARTMENT_OTHER): Payer: BC Managed Care – PPO | Admitting: Psychiatry

## 2023-01-01 VITALS — Wt 183.0 lb

## 2023-01-01 DIAGNOSIS — F3130 Bipolar disorder, current episode depressed, mild or moderate severity, unspecified: Secondary | ICD-10-CM

## 2023-01-01 DIAGNOSIS — F419 Anxiety disorder, unspecified: Secondary | ICD-10-CM | POA: Diagnosis not present

## 2023-01-01 MED ORDER — HYDROXYZINE HCL 25 MG PO TABS
25.0000 mg | ORAL_TABLET | Freq: Two times a day (BID) | ORAL | 1 refills | Status: DC | PRN
Start: 2023-01-01 — End: 2023-09-04

## 2023-01-01 NOTE — Progress Notes (Signed)
[facility] MD Virtual Progress Note   Patient Location: Home Provider Location: Home Office  I connect with patient by video and verified that I am speaking with correct person by using two identifiers. I discussed the limitations of evaluation and management by telemedicine and the availability of in person appointments. I also discussed with the patient that there may be a patient responsible charge related to this service. The patient expressed understanding and agreed to proceed.  Tracy Cohen [id number] 32 y.o.  01/01/2023 1:38 PM  History of Present Illness:  Patient is evaluated by video session.  She is a 32 year old African-American single employed female who was seen first time 6 weeks ago.  She was referred from primary care for the management of psychiatric symptoms.  She has irritability, depression, mood swing, anger and anxiety.  She struggled with financial distress and limited support.  She has a 48-year-old baby but father of the baby is not in the picture.  She has some support from her sister.  She reports for [employer] but at the time she was out of work.  She was having manic episodes where she described easily irritable, upset with the highs and lows and having argument with supervisor and customers.  Sometimes she feels that she is in a dark hole and feeling worthless and run away from situation but denies any suicidal thoughts.  She is taking REXULTI 1 mg prescribed by PCP and be recommended to increase 2 mg and added low-dose hydroxyzine.  She was also taking gabapentin and Zoloft which was recommended to discontinue.  She noticed improvement in her mood.  She is sleeping better.  She still gets sometimes frustrated due to the center as most of the time is close.  She is back to work yesterday she has no issues but also yesterday she was busy resolving the computer issue.  She has no tremor or shakes or any EPS.  We recommended IOP but she did not receive  any message from program coordinator.  She like to consider group therapy.  She had requested to fill of the paper for FMLA which we did.  She reported her appetite is okay.  There is no significant weight gain or weight loss.  She has support from her sister.  She has 2 brothers who live out of town.  Her parents are separated.  Father has dementia.  She is taking hydroxyzine that is helping her anxiety and sleep.  She denies any illegal substance use but sometimes she did to drink alcohol on the weekends with her friends.  She denies any intoxication, blackouts.  Past Psychiatric History: H/O seeking help in 2020-01-19 when grandmother died.  Given Xanax and later Zoloft and gabapentin added by PCP but ineffective. No history of suicidal attempt or inpatient treatment.  History of mood swings, impulsive behavior, anger, irritability and anxiety.    Outpatient Encounter Medications as of 01/01/2023  Medication Sig   brexpiprazole (REXULTI) 2 MG TABS tablet Take 0.5 tablets (1 mg total) by mouth daily. Take 2 mg after seven days as directed by PCP   famotidine (PEPCID) 40 MG tablet Take 1 tablet (40 mg total) by mouth daily.   Fremanezumab-vfrm (AJOVY) 225 MG/1.5ML SOAJ Inject 225 mg into the skin every 28 (twenty-eight) days.   hydrOXYzine (ATARAX) 25 MG tablet Take 1 tablet (25 mg total) by mouth 2 (two) times daily as needed for anxiety.   methocarbamol (ROBAXIN) 500 MG tablet Take 1 tablet (500  mg total) by mouth 3 (three) times daily. (Patient not taking: Reported on 11/28/2022)   Rimegepant Sulfate (NURTEC) 75 MG TBDP Take 1 tablet by mouth daily as needed (Maximum 1 tablet in 24 hours).   SUMAtriptan (IMITREX) 20 MG/ACT nasal spray Place 1 spray (20 mg total) into the nose as needed for migraine or headache. May repeat in 2 hours if headache persists or recurs.  Maximum 2 sprays in 24 hours.   [DISCONTINUED] gabapentin (NEURONTIN) 300 MG capsule Take 1 capsule (300 mg total) by mouth 2 (two) times  daily. (Patient not taking: Reported on 11/28/2022)   [DISCONTINUED] hydrOXYzine (ATARAX) 25 MG tablet Take 1 tablet (25 mg total) by mouth 2 (two) times daily as needed for anxiety.   [DISCONTINUED] meloxicam (MOBIC) 15 MG tablet TAKE 1 TABLET (15 MG TOTAL) BY MOUTH DAILY. (Patient not taking: Reported on 11/28/2022)   [DISCONTINUED] ondansetron (ZOFRAN-ODT) 4 MG disintegrating tablet Take 1 tablet (4 mg total) by mouth every 8 (eight) hours as needed for nausea or vomiting. (Patient not taking: Reported on 11/28/2022)   [DISCONTINUED] sertraline (ZOLOFT) 100 MG tablet Take 1 tablet (100 mg total) by mouth at bedtime.   No facility-administered encounter medications on file as of 01/01/2023.    Recent Results (from the past 2160 hour(s))  Cytology - PAP( [facility])     Status: None   Collection Time: 11/14/22 10:43 AM  Result Value Ref Range   High risk HPV Negative    Adequacy      Satisfactory for evaluation; transformation zone component PRESENT.   Diagnosis      - Negative for intraepithelial lesion or malignancy (NILM)   Comment Normal Reference Range HPV - Negative      Psychiatric Specialty Exam: Physical Exam  Review of Systems  Weight 183 lb (83 kg), not currently breastfeeding.There is no height or weight on file to calculate BMI.  General Appearance: Casual  Eye Contact:  Fair  Speech:  Slow  Volume:  Normal  Mood:  Anxious  Affect:  Congruent  Thought Process:  Descriptions of Associations: Intact  Orientation:  Full (Time, Place, and Person)  Thought Content:  Rumination  Suicidal Thoughts:  No  Homicidal Thoughts:  No  Memory:  Immediate;   Good Recent;   Good Remote;   Good  Judgement:  Intact  Insight:  Present  Psychomotor Activity:  Decreased  Concentration:  Concentration: Fair and Attention Span: Good  Recall:  Good  Fund of Knowledge:  Good  Language:  Good  Akathisia:  No  Handed:  Right  AIMS (if indicated):     Assets:  Communication  Skills Desire for Improvement Housing Talents/Skills Transportation  ADL's:  Intact  Cognition:  WNL  Sleep:  6-7 hrs     Assessment/Plan: Bipolar I disorder, most recent episode depressed (HCC) - Plan: hydrOXYzine (ATARAX) 25 MG tablet  Anxiety - Plan: hydrOXYzine (ATARAX) 25 MG tablet  I reviewed notes from other provider.  She is now taking REXULTI 2 mg prescribed by PCP recently.  She noticed improvement.  She also liked the hydroxyzine that is helping her anxiety and sleep.  She is not doing breast-feeding as we have discussed in detail about in the past session.  She is back to work since yesterday.  She is still having some issues with FMLA and Loletta Parish to submit the paperwork.  I again discussed about IOP and patient like to consider again.  We will contact the IOP program coordinator to contact  the patient.  For now continue REXULTI 2 mg and hydroxyzine 25 mg as needed.  Discussed medication side effects and benefits.  Discussed patient's diagnosis and prognosis.  Encourage walking, exercise.  Follow up in 6 weeks.   Follow Up Instructions:     I discussed the assessment and treatment plan with the patient. The patient was provided an opportunity to ask questions and all were answered. The patient agreed with the plan and demonstrated an understanding of the instructions.   The patient was advised to call back or seek an in-person evaluation if the symptoms worsen or if the condition fails to improve as anticipated.    Collaboration of Care: Other provider involved in patient's care AEB notes are available in epic to review.  Patient/Guardian was advised Release of Information must be obtained prior to any record release in order to collaborate their care with an outside provider. Patient/Guardian was advised if they have not already done so to contact the registration department to sign all necessary forms in order for [place] to release information regarding their care.    Consent: Patient/Guardian gives verbal consent for treatment and assignment of benefits for services provided during this visit. Patient/Guardian expressed understanding and agreed to proceed.     I provided 29 minutes of non face to face time during this encounter.  Note: This document was prepared by [employer] voice dictation technology and any errors that results from this process are unintentional.    Cleotis Nipper, MD 01/01/2023

## 2023-01-01 NOTE — Telephone Encounter (Signed)
D:  Dr. Lolly Mustache referred pt to virtual MH-IOP; case manager had previously oriented pt on 11-28-22.  Pt had stated then that she wanted to verify her benefits and would contact the cm but didn't.  A:  Pt admitted that she remembered cm calling her about group back in July.  Re-oriented pt.  Informed pt that she would need an assessment done before starting group.  "I just returned to work this week from being out June 11th-August 27th; I haven't gotten paid at all from that leave.  I need to call my HR and disability company to see how would that work if I was to be taken out again and they never paid me one dime from the other leave.  I am three months behind on my bills already."  Case manager provided pt with support.  Strongly recommended her to make contact with the disability company to see if there was anything that they needed as to why they never paid her and to inquire about her starting virtual MH-IOP.  Encouraged pt to call the case mgr back once she talks to the disability company in order to discuss options (ie. Individual counseling, [employer]) in case she can't do MH-IOP.  Inform Dr. Lolly Mustache.  R:  Pt receptive.

## 2023-01-08 ENCOUNTER — Encounter: Payer: Self-pay | Admitting: Family Medicine

## 2023-01-08 ENCOUNTER — Encounter (HOSPITAL_COMMUNITY): Payer: Self-pay

## 2023-01-13 ENCOUNTER — Telehealth: Payer: Self-pay | Admitting: *Deleted

## 2023-01-13 NOTE — Telephone Encounter (Signed)
Error

## 2023-01-15 ENCOUNTER — Other Ambulatory Visit (HOSPITAL_COMMUNITY): Payer: Self-pay | Admitting: Psychiatry

## 2023-01-15 DIAGNOSIS — F419 Anxiety disorder, unspecified: Secondary | ICD-10-CM

## 2023-01-15 DIAGNOSIS — F3130 Bipolar disorder, current episode depressed, mild or moderate severity, unspecified: Secondary | ICD-10-CM

## 2023-02-12 ENCOUNTER — Encounter (HOSPITAL_COMMUNITY): Payer: Self-pay | Admitting: Psychiatry

## 2023-02-12 ENCOUNTER — Telehealth (HOSPITAL_BASED_OUTPATIENT_CLINIC_OR_DEPARTMENT_OTHER): Payer: BC Managed Care – PPO | Admitting: Psychiatry

## 2023-02-12 DIAGNOSIS — F3130 Bipolar disorder, current episode depressed, mild or moderate severity, unspecified: Secondary | ICD-10-CM

## 2023-02-12 DIAGNOSIS — F419 Anxiety disorder, unspecified: Secondary | ICD-10-CM

## 2023-02-12 NOTE — Progress Notes (Signed)
[facility] MD Virtual Progress Note   Patient Location: Home Provider Location: Office  I connect with patient by video and verified that I am speaking with correct person by using two identifiers. I discussed the limitations of evaluation and management by telemedicine and the availability of in person appointments. I also discussed with the patient that there may be a patient responsible charge related to this service. The patient expressed understanding and agreed to proceed.  Tracy Cohen [id number] 32 y.o.  02/12/2023 2:11 PM  History of Present Illness:  Patient is evaluated by video session.  She appears frustrated and irritable because of financial strain.  When asked about the medication she replied that she had stopped because she believed these medicines were causing sleepiness during the day and she could not function.  She reported a lot of stress at work and she is not sure if she can keep the job because she is behind her work.  She works for [employer] [place].  She reported for past 6 weeks her 39-year-old daughter is sick and she does not have a time to take her to the doctor's appointment.  She is having issues with the human resources to get time off.  We have recommended group therapy, individual therapy and FMLA but patient did not able to get it from work filled/signed because she did not want to do the program.  She reported cannot afford financially and behind her bills.  She has limited support from her sister.  She has not seen the daughter's father more than a year.  She feels the medicine cannot help as most of her symptoms are financial strain and limited support.  She admitted easily tearful, crying, upset.  Patient has 2 brother who lives out of town, parents are separated, father has dementia.  We started the hydroxyzine on the last visit which had helped her but now she feels it is causing sleep during the day.  She also stopped the REXULTI which is  prescribed by PCP for the same reason that it is causing sedation during the day.  In the past she had tried Xanax, Zoloft, gabapentin, Prozac, BuSpar and claimed that none of these medicines work.  Patient admitted some time hopelessness but denies any suicidal thoughts or homicidal thoughts.  Past Psychiatric History: H/O seeking help in 05-Mar-2020 when grandmother died.  Given Xanax and later Zoloft and gabapentin added by PCP but ineffective. No history of suicidal attempt or inpatient treatment.  History of mood swings, impulsive behavior, anger, irritability and anxiety.      Outpatient Encounter Medications as of 02/12/2023  Medication Sig   brexpiprazole (REXULTI) 2 MG TABS tablet Take 0.5 tablets (1 mg total) by mouth daily. Take 2 mg after seven days as directed by PCP   famotidine (PEPCID) 40 MG tablet Take 1 tablet (40 mg total) by mouth daily.   Fremanezumab-vfrm (AJOVY) 225 MG/1.5ML SOAJ Inject 225 mg into the skin every 28 (twenty-eight) days.   hydrOXYzine (ATARAX) 25 MG tablet Take 1 tablet (25 mg total) by mouth 2 (two) times daily as needed for anxiety.   methocarbamol (ROBAXIN) 500 MG tablet Take 1 tablet (500 mg total) by mouth 3 (three) times daily. (Patient not taking: Reported on 11/28/2022)   Rimegepant Sulfate (NURTEC) 75 MG TBDP Take 1 tablet by mouth daily as needed (Maximum 1 tablet in 24 hours).   SUMAtriptan (IMITREX) 20 MG/ACT nasal spray Place 1 spray (20 mg total) into the nose as  needed for migraine or headache. May repeat in 2 hours if headache persists or recurs.  Maximum 2 sprays in 24 hours.   No facility-administered encounter medications on file as of 02/12/2023.    No results found for this or any previous visit (from the past 2160 hour(s)).   Psychiatric Specialty Exam: Physical Exam  Review of Systems  not currently breastfeeding.There is no height or weight on file to calculate BMI.  General Appearance: Casual, tearful  Eye Contact:  Fair  Speech:    fast  Volume:  Increased, pressured   Mood:  Dysphoric and Irritable  Affect:  Labile  Thought Process:  Descriptions of Associations: Intact  Orientation:  Full (Time, Place, and Person)  Thought Content:  Rumination  Suicidal Thoughts:  No  Homicidal Thoughts:  No  Memory:  Immediate;   Good Recent;   Fair Remote;   Fair  Judgement:  Fair  Insight:  Shallow  Psychomotor Activity:  Increased  Concentration:  Concentration: Good and Attention Span: Good  Recall:  Fair  Fund of Knowledge:  Good  Language:  Good  Akathisia:  No  Handed:  Right  AIMS (if indicated):     Assets:  Communication Skills Desire for Improvement Housing Social Support Transportation  ADL's:  Intact  Cognition:  WNL  Sleep:  fair     Assessment/Plan: Bipolar I disorder, most recent episode depressed (HCC)  Anxiety  Discussed psychosocial stressors.  Patient does not want to be on any medication.  I offered her Lamictal per patient recall it was given in the past and at this time she does not want any new medication.  She reported that she had dealing with the stressors for a while and she can handle without any medication.  I offered therapy but patient refused.  Her biggest challenge is financial strain.  Patient agreed to give [place] a call back when she is ready to make appointment.  We discussed if any time having suicidal thoughts or homicidal thought then she need to call 911 or go to local emergency room.  No new medication given as per patient request.  We will not schedule any more appointments as per patient request.   Follow Up Instructions:     I discussed the assessment and treatment plan with the patient. The patient was provided an opportunity to ask questions and all were answered. The patient agreed with the plan and demonstrated an understanding of the instructions.   The patient was advised to call back or seek an in-person evaluation if the symptoms worsen or if the condition fails  to improve as anticipated.    Collaboration of Care: Other provider involved in patient's care AEB I will send my note to her PCP for collaboration  Patient/Guardian was advised Release of Information must be obtained prior to any record release in order to collaborate their care with an outside provider. Patient/Guardian was advised if they have not already done so to contact the registration department to sign all necessary forms in order for [place] to release information regarding their care.   Consent: Patient/Guardian gives verbal consent for treatment and assignment of benefits for services provided during this visit. Patient/Guardian expressed understanding and agreed to proceed.     I provided 15 minutes of non face to face time during this encounter.  Note: This document was prepared by [employer] voice dictation technology and any errors that results from this process are unintentional.    Cleotis Nipper, MD 02/12/2023

## 2023-02-12 NOTE — Telephone Encounter (Signed)
I reached out to the patient to clarify her message, I did not see what she was talking about in the note. I identified myself and asked her to please clarify what she meant. Patient began yelling and saying that Dr. Lolly Cohen said he could not help her so it doesn't matter. Patient started getting louder and told me to have a nice day, At that point, I thanked her and said I was sorry and I hoped she had a nice day as well.

## 2023-02-13 ENCOUNTER — Encounter (HOSPITAL_COMMUNITY): Payer: Self-pay | Admitting: Psychiatry

## 2023-02-13 ENCOUNTER — Telehealth (HOSPITAL_COMMUNITY): Payer: Self-pay | Admitting: Psychiatry

## 2023-02-13 NOTE — Telephone Encounter (Signed)
Per Dr. Lolly Mustache, patient has been dismissed. Patient was sent dismissal letter via certified mail on 02/13/2023.

## 2023-03-06 ENCOUNTER — Ambulatory Visit: Payer: BC Managed Care – PPO | Admitting: Family Medicine

## 2023-06-09 ENCOUNTER — Telehealth: Payer: Self-pay | Admitting: Pharmacy Technician

## 2023-06-09 ENCOUNTER — Other Ambulatory Visit (HOSPITAL_COMMUNITY): Payer: Self-pay

## 2023-06-09 NOTE — Telephone Encounter (Signed)
Pharmacy Patient Advocate Encounter   Received notification from CoverMyMeds that prior authorization for NURTEC 75MG  is required/requested.   Insurance verification completed.   The patient is insured through [facility] .   Per test claim: PA required; PA submitted to above mentioned insurance via CoverMyMeds Key/confirmation #/EOC [id number] Status is pending

## 2023-06-09 NOTE — Telephone Encounter (Signed)
Pharmacy Patient Advocate Encounter  Received notification from [facility] that Prior Authorization for NURTEC 75MG  has been APPROVED from 2.3.25 to 2.3.26. Ran test claim, Copay is $0. This test claim was processed through [facility]- copay amounts may vary at other pharmacies due to pharmacy/plan contracts, or as the patient moves through the different stages of their insurance plan.   PA #/Case ID/Reference #: [id number]

## 2023-06-21 ENCOUNTER — Ambulatory Visit
Admission: EM | Admit: 2023-06-21 | Discharge: 2023-06-21 | Disposition: A | Discharge status: Home / Self Care | Payer: BC Managed Care – PPO | Attending: Internal Medicine | Admitting: Internal Medicine

## 2023-06-21 DIAGNOSIS — F17210 Nicotine dependence, cigarettes, uncomplicated: Secondary | ICD-10-CM | POA: Diagnosis not present

## 2023-06-21 DIAGNOSIS — B9789 Other viral agents as the cause of diseases classified elsewhere: Secondary | ICD-10-CM | POA: Diagnosis not present

## 2023-06-21 DIAGNOSIS — J069 Acute upper respiratory infection, unspecified: Secondary | ICD-10-CM | POA: Insufficient documentation

## 2023-06-21 LAB — POC COVID19/FLU A&B COMBO
Covid Antigen, POC: NEGATIVE
Influenza A Antigen, POC: NEGATIVE
Influenza B Antigen, POC: NEGATIVE

## 2023-06-21 NOTE — ED Triage Notes (Signed)
Pt c/o cough, congestion, and scratchy throat since Wednesday. She also developed an intermittent headaches yesterday.

## 2023-06-21 NOTE — ED Provider Notes (Signed)
[facility]    CSN: [id number] Arrival date & time: 06/21/23  1350      History   Chief Complaint Chief Complaint  Patient presents with   Cough   Nasal Congestion    HPI Tracy Cohen is a 33 y.o. female.   Patient presents to urgent care for evaluation of cough, nasal congestion, intermittent scratchy throat, and headaches that started 4 days ago on Wednesday, June 18, 2023.  Cough is mostly dry and nonproductive.  Voice feels and sounds hoarse.  Reports recent sick contacts at funeral with similar symptoms.  Current everyday cigarette smoker, denies drug use.  Denies history of chronic respiratory problems.  Denies nausea, vomiting, diarrhea, abdominal pain, shortness of breath, chest pain, and fever/chills.  Taking over-the-counter Mucinex with some relief.   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 Authorizing Provider  brexpiprazole (REXULTI) 2 MG TABS tablet Take 0.5 tablets (1 mg total) by mouth daily. Take 2 mg after seven days as directed by  PCP Patient not taking: Reported on 02/12/2023 12/04/22   Georganna Skeans, MD  famotidine (PEPCID) 40 MG tablet Take 1 tablet (40 mg total) by mouth daily. 11/13/22   Georganna Skeans, MD  Fremanezumab-vfrm (AJOVY) 225 MG/1.5ML SOAJ Inject 225 mg into the skin every 28 (twenty-eight) days. 04/26/22   Drema Dallas, DO  hydrOXYzine (ATARAX) 25 MG tablet Take 1 tablet (25 mg total) by mouth 2 (two) times daily as needed for anxiety. Patient not taking: Reported on 02/12/2023 01/01/23   Arfeen, Phillips Grout, MD  methocarbamol (ROBAXIN) 500 MG tablet Take 1 tablet (500 mg total) by mouth 3 (three) times daily. Patient not taking: Reported on 11/28/2022 10/03/22   Juanda Chance, NP  Rimegepant Sulfate (NURTEC) 75 MG TBDP Take 1 tablet by mouth daily as needed (Maximum 1 tablet in 24 hours). Patient not taking: Reported on 06/21/2023 04/26/22   Drema Dallas, DO  SUMAtriptan (IMITREX) 20 MG/ACT nasal spray Place 1 spray (20 mg total) into the nose as needed for migraine or headache. May repeat in 2 hours if headache persists or recurs.  Maximum 2 sprays in 24 hours. 04/26/22   Drema Dallas, DO    Family History Family History  Problem Relation Age of Onset  High blood pressure Mother    Mental illness Father        schizophrenia   Esophageal cancer Father    Stroke Maternal Grandmother    Mental illness Maternal Grandmother        bipolar   Kidney cancer Maternal Grandmother    Cancer Maternal Grandfather    Prostate cancer Maternal Grandfather    Colon cancer Maternal Grandfather    Mental illness Paternal Grandmother    Cancer Paternal Grandmother    Kidney cancer Paternal Grandmother    Mental illness Paternal Grandfather     Social History Social History   Tobacco Use   Smoking status: Former    Types: Cigarettes   Smokeless tobacco: Never  Vaping Use   Vaping status: Never Used  Substance Use Topics   Alcohol use: Not Currently    Comment: 6/week (on weekend)   Drug use: Not  Currently     Allergies   Patient has no known allergies.   Review of Systems Review of Systems  Respiratory:  Positive for cough.   Per HPI   Physical Exam Triage Vital Signs ED Triage Vitals  Encounter Vitals Group     BP 06/21/23 1512 124/82     Systolic BP Percentile --      Diastolic BP Percentile --      Pulse Rate 06/21/23 1512 64     Resp 06/21/23 1512 18     Temp 06/21/23 1512 98.2 F (36.8 C)     Temp Source 06/21/23 1512 Oral     SpO2 06/21/23 1512 98 %     Weight --      Height --      Head Circumference --      Peak Flow --      Pain Score 06/21/23 1514 7     Pain Loc --      Pain Education --      Exclude from Growth Chart --    No data found.  Updated Vital Signs BP 124/82 (BP Location: Right Arm)   Pulse 64   Temp 98.2 F (36.8 C) (Oral)   Resp 18   SpO2 98%   Visual Acuity Right Eye Distance:   Left Eye Distance:   Bilateral Distance:    Right Eye Near:   Left Eye Near:    Bilateral Near:     Physical Exam Vitals and nursing note reviewed.  Constitutional:      Appearance: She is not ill-appearing or toxic-appearing.  HENT:     Head: Normocephalic and atraumatic.     Right Ear: Hearing, tympanic membrane, ear canal and external ear normal.     Left Ear: Hearing, tympanic membrane, ear canal and external ear normal.     Nose: Nose normal.     Mouth/Throat:     Lips: Pink.     Mouth: Mucous membranes are moist. No injury or oral lesions.     Dentition: Normal dentition.     Tongue: No lesions.     Pharynx: Oropharynx is clear. Uvula midline. No pharyngeal swelling, oropharyngeal exudate, posterior oropharyngeal erythema, uvula swelling or postnasal drip.     Tonsils: No tonsillar exudate.  Eyes:     General: Lids are normal. Vision grossly intact. Gaze aligned appropriately.     Extraocular Movements: Extraocular movements intact.     Conjunctiva/sclera: Conjunctivae normal.  Neck:     Trachea: Trachea and phonation normal.   Cardiovascular:     Rate and Rhythm:  Normal rate and regular rhythm.     Heart sounds: Normal heart sounds, S1 normal and S2 normal.  Pulmonary:     Effort: Pulmonary effort is normal. No respiratory distress.     Breath sounds: Normal breath sounds and air entry.  Musculoskeletal:     Cervical back: Neck supple.  Lymphadenopathy:     Cervical: No cervical adenopathy.  Skin:    General: Skin is warm and dry.     Capillary Refill: Capillary refill takes less than 2 seconds.     Findings: No rash.  Neurological:     General: No focal deficit present.     Mental Status: She is alert and oriented to person, place, and time. Mental status is at baseline.     Cranial Nerves: No dysarthria or facial asymmetry.  Psychiatric:        Mood and Affect: Mood normal.        Speech: Speech normal.        Behavior: Behavior normal.        Thought Content: Thought content normal.        Judgment: Judgment normal.      UC Treatments / Results  Labs (all labs ordered are listed, but only abnormal results are displayed) Labs Reviewed  SARS CORONAVIRUS 2 (TAT 6-24 HRS)  POC COVID19/FLU A&B COMBO    EKG   Radiology No results found.  Procedures Procedures (including critical care time)  Medications Ordered in UC Medications - No data to display  Initial Impression / Assessment and Plan / UC Course  I have reviewed the triage vital signs and the nursing notes.  Pertinent labs & imaging results that were available during my care of the patient were reviewed by me and considered in my medical decision making (see chart for details).   1. Viral URI With cough Suspect viral URI, viral syndrome.  Strep/viral testing: POC flu and COVID testing negative.  PCR COVID test pending.  Physical exam findings reassuring, vital signs hemodynamically stable, and lungs clear, therefore deferred imaging of the chest.  Advised supportive care/prescriptions for symptomatic relief as outlined in AVS.     Counseled patient on potential for adverse effects with medications prescribed/recommended today, strict ER and return-to-clinic precautions discussed, patient verbalized understanding.    Final Clinical Impressions(s) / UC Diagnoses   Final diagnoses:  Viral URI with cough  Cigarette nicotine dependence without complication     Discharge Instructions      You have a viral illness which will improve on its own with rest, fluids, and medications to help with your symptoms.  Tylenol, guaifenesin (plain mucinex), and saline nasal sprays may help relieve symptoms.   Two teaspoons of honey in 1 cup of warm water every 4-6 hours may help with throat pains.  Humidifier in room at nighttime may help soothe cough (clean well daily).   For chest pain, shortness of breath, inability to keep food or fluids down without vomiting, fever that does not respond to tylenol or motrin, or any other severe symptoms, please go to the ER for further evaluation. Return to urgent care as needed, otherwise follow-up with PCP.       ED Prescriptions   None    PDMP not reviewed this encounter.   Carlisle Beers, [place] 06/21/23 1546

## 2023-06-21 NOTE — Discharge Instructions (Signed)

## 2023-06-22 LAB — SARS CORONAVIRUS 2 (TAT 6-24 HRS): SARS Coronavirus 2: NEGATIVE

## 2023-06-24 ENCOUNTER — Telehealth: Payer: BC Managed Care – PPO | Admitting: Physician Assistant

## 2023-06-24 DIAGNOSIS — J208 Acute bronchitis due to other specified organisms: Secondary | ICD-10-CM | POA: Diagnosis not present

## 2023-06-24 MED ORDER — BENZONATATE 100 MG PO CAPS: 100.0000 mg | ORAL_CAPSULE | Freq: Three times a day (TID) | ORAL | 0 refills | Status: DC | PRN

## 2023-06-24 MED ORDER — ALBUTEROL SULFATE HFA 108 (90 BASE) MCG/ACT IN AERS: 2.0000 | INHALATION_SPRAY | Freq: Four times a day (QID) | RESPIRATORY_TRACT | 0 refills | Status: DC | PRN

## 2023-06-24 NOTE — Progress Notes (Signed)
 I have spent 5 minutes in review of e-visit questionnaire, review and updating patient chart, medical decision making and response to patient.   Piedad Climes, PA-C

## 2023-06-24 NOTE — Addendum Note (Signed)
Addended by: Waldon Merl on: 06/24/2023 07:33 AM   Modules accepted: Orders

## 2023-06-24 NOTE — Progress Notes (Signed)
 E-Visit for Cough   We are sorry that you are not feeling well.  Here is how we plan to help!  Based on your presentation I believe you most likely have A cough due to a virus.  This is called viral bronchitis and is best treated by rest, plenty of fluids and control of the cough.  You may use Ibuprofen or Tylenol as directed to help your symptoms.     In addition you may use A prescription cough medication called Tessalon Perles 100mg . You may take 1-2 capsules every 8 hours as needed for your cough.   From your responses in the eVisit questionnaire you describe inflammation in the upper respiratory tract which is causing a significant cough.  This is commonly called Bronchitis and has four common causes:   Allergies Viral Infections Acid Reflux Bacterial Infection Allergies, viruses and acid reflux are treated by controlling symptoms or eliminating the cause. An example might be a cough caused by taking certain blood pressure medications. You stop the cough by changing the medication. Another example might be a cough caused by acid reflux. Controlling the reflux helps control the cough.  USE OF BRONCHODILATOR ("RESCUE") INHALERS: There is a risk from using your bronchodilator too frequently.  The risk is that over-reliance on a medication which only relaxes the muscles surrounding the breathing tubes can reduce the effectiveness of medications prescribed to reduce swelling and congestion of the tubes themselves.  Although you feel brief relief from the bronchodilator inhaler, your asthma may actually be worsening with the tubes becoming more swollen and filled with mucus.  This can delay other crucial treatments, such as oral steroid medications. If you need to use a bronchodilator inhaler daily, several times per day, you should discuss this with your provider.  There are probably better treatments that could be used to keep your asthma under control.     HOME CARE Only take medications as  instructed by your medical team. Complete the entire course of an antibiotic. Drink plenty of fluids and get plenty of rest. Avoid close contacts especially the very young and the elderly Cover your mouth if you cough or cough into your sleeve. Always remember to wash your hands A steam or ultrasonic humidifier can help congestion.   GET HELP RIGHT AWAY IF: You develop worsening fever. You become short of breath You cough up blood. Your symptoms persist after you have completed your treatment plan MAKE SURE YOU  Understand these instructions. Will watch your condition. Will get help right away if you are not doing well or get worse.    Thank you for choosing an e-visit.  Your e-visit answers were reviewed by a board certified advanced clinical practitioner to complete your personal care plan. Depending upon the condition, your plan could have included both over the counter or prescription medications.  Please review your pharmacy choice. Make sure the pharmacy is open so you can pick up prescription now. If there is a problem, you may contact your provider through [employer] and have the prescription routed to another pharmacy.  Your safety is important to [place]. If you have drug allergies check your prescription carefully.   For the next 24 hours you can use MyChart to ask questions about today's visit, request a non-urgent call back, or ask for a work or school excuse. You will get an email in the next two days asking about your experience. I hope that your e-visit has been valuable and will speed your recovery.

## 2023-06-28 ENCOUNTER — Telehealth: Payer: BC Managed Care – PPO | Admitting: Physician Assistant

## 2023-06-28 DIAGNOSIS — R042 Hemoptysis: Secondary | ICD-10-CM

## 2023-06-28 NOTE — Progress Notes (Signed)

## 2023-08-07 DIAGNOSIS — F3132 Bipolar disorder, current episode depressed, moderate: Secondary | ICD-10-CM | POA: Diagnosis not present

## 2023-08-15 DIAGNOSIS — F3132 Bipolar disorder, current episode depressed, moderate: Secondary | ICD-10-CM | POA: Diagnosis not present

## 2023-09-04 ENCOUNTER — Ambulatory Visit: Admitting: Family Medicine

## 2023-09-04 ENCOUNTER — Other Ambulatory Visit: Payer: Self-pay

## 2023-09-04 ENCOUNTER — Encounter: Payer: Self-pay | Admitting: Family Medicine

## 2023-09-04 ENCOUNTER — Other Ambulatory Visit (HOSPITAL_COMMUNITY)
Admission: RE | Admit: 2023-09-04 | Discharge: 2023-09-04 | Disposition: A | Discharge status: Home / Self Care | Source: Ambulatory Visit | Attending: Family Medicine | Admitting: Family Medicine

## 2023-09-04 VITALS — BP 112/69 | HR 74 | Wt 171.0 lb

## 2023-09-04 DIAGNOSIS — N898 Other specified noninflammatory disorders of vagina: Secondary | ICD-10-CM

## 2023-09-04 DIAGNOSIS — K5900 Constipation, unspecified: Secondary | ICD-10-CM | POA: Diagnosis not present

## 2023-09-04 DIAGNOSIS — L731 Pseudofolliculitis barbae: Secondary | ICD-10-CM | POA: Diagnosis not present

## 2023-09-04 MED ORDER — POLYETHYLENE GLYCOL 3350 17 GM/SCOOP PO POWD
17.0000 g | Freq: Every day | ORAL | 1 refills | Status: AC | PRN
Start: 2023-09-04 — End: ?

## 2023-09-04 NOTE — Progress Notes (Signed)
   [facility] OFFICE VISIT NOTE  History:   Tracy Cohen is a 33 y.o. [phone] here today for ingrown hair concern.  Has had an ingrown hair that she has been unable to get rid of in her inguinal hair Has been present for months Has never had any drainage Not painful Has poked at it to try and get it out  There are no preventive care reminders to display for this patient.  Past Medical History:  Diagnosis Date   Allergy    Anxiety    Depression    Migraine     Past Surgical History:  Procedure Laterality Date   ADENOIDECTOMY     TONSILLECTOMY      The following portions of the patient's history were reviewed and updated as appropriate: allergies, current medications, past family history, past medical history, past social history, past surgical history and problem list.   Health Maintenance:   Last pap: Lab Results  Component Value Date   DIAGPAP  11/14/2022    - Negative for intraepithelial lesion or malignancy (NILM)   HPVHIGH Negative 11/14/2022    Last mammogram:  N/a    Review of Systems:  Pertinent items noted in HPI and remainder of comprehensive ROS otherwise negative.  Physical Exam:  BP 112/69   Pulse 74   Wt 171 lb (77.6 kg)   BMI 33.40 kg/m  CONSTITUTIONAL: Well-developed, well-nourished female in no acute distress.  HEENT:  Normocephalic, atraumatic. External right and left ear normal. No scleral icterus.  NECK: Normal range of motion, supple, no masses noted on observation SKIN: No rash noted. Not diaphoretic. No erythema. No pallor. MUSCULOSKELETAL: Normal range of motion. No edema noted. NEUROLOGIC: Alert and oriented to person, place, and time. Normal muscle tone coordination.  PSYCHIATRIC: Normal mood and affect. Normal behavior. Normal judgment and thought content. RESPIRATORY: Effort normal, no problems with respiration noted ABDOMEN: No masses noted. No other overt distention noted.   PELVIC: external genitalia  unremarkable. Small 2 mm white cyst like structure, no hair seen below the skin, no discharge or punctum  Labs and Imaging No results found for this or any previous visit (from the past week). No results found.    Assessment and Plan:   Problem List Items Addressed This Visit   None Visit Diagnoses       Ingrown hair    -  Primary     Vaginal discharge       Relevant Orders   Cervicovaginal ancillary only     Constipation, unspecified constipation type       Relevant Medications   polyethylene glycol powder (GLYCOLAX /MIRALAX ) 17 GM/SCOOP powder      #Ingrown hair vs cyst:  Benign and asymptomatic. Discussed we could unroof it but would recommend leaving it alone, she is OK with this plan and will contact us  if she changes her mind.  Vaginal Discharge Self swab  Constipation Trial miralax    Routine preventative health maintenance measures emphasized. Please refer to After Visit Summary for other counseling recommendations.   Return if symptoms worsen or fail to improve.    Total face-to-face time with patient: 20 minutes.  Over 50% of encounter was spent on counseling and coordination of care.   Teena Feast, MD/MPH Attending Family Medicine Physician, [facility] for [employer], [facility]

## 2023-09-05 LAB — CERVICOVAGINAL ANCILLARY ONLY
Bacterial Vaginitis (gardnerella): POSITIVE — AB
Candida Glabrata: NEGATIVE
Candida Vaginitis: NEGATIVE
Chlamydia: NEGATIVE
Comment: NEGATIVE
Comment: NEGATIVE
Comment: NEGATIVE
Comment: NEGATIVE
Comment: NEGATIVE
Comment: NORMAL
Neisseria Gonorrhea: NEGATIVE
Trichomonas: NEGATIVE

## 2023-09-08 ENCOUNTER — Other Ambulatory Visit: Payer: Self-pay

## 2023-09-08 ENCOUNTER — Encounter: Payer: Self-pay | Admitting: Family Medicine

## 2023-09-08 DIAGNOSIS — N760 Acute vaginitis: Secondary | ICD-10-CM

## 2023-09-08 MED ORDER — METRONIDAZOLE 500 MG PO TABS: 500.0000 mg | ORAL_TABLET | Freq: Two times a day (BID) | ORAL | 0 refills | Status: DC

## 2023-09-08 MED ORDER — METRONIDAZOLE 0.75 % VA GEL
1.0000 | Freq: Every day | VAGINAL | 0 refills | Status: AC
Start: 2023-09-08 — End: 2023-09-15

## 2023-09-11 DIAGNOSIS — F3132 Bipolar disorder, current episode depressed, moderate: Secondary | ICD-10-CM | POA: Diagnosis not present

## 2023-09-12 DIAGNOSIS — Z79899 Other long term (current) drug therapy: Secondary | ICD-10-CM | POA: Diagnosis not present

## 2023-09-12 DIAGNOSIS — E559 Vitamin D deficiency, unspecified: Secondary | ICD-10-CM | POA: Diagnosis not present

## 2023-09-12 DIAGNOSIS — D519 Vitamin B12 deficiency anemia, unspecified: Secondary | ICD-10-CM | POA: Diagnosis not present

## 2023-09-12 DIAGNOSIS — E785 Hyperlipidemia, unspecified: Secondary | ICD-10-CM | POA: Diagnosis not present

## 2023-09-15 DIAGNOSIS — J069 Acute upper respiratory infection, unspecified: Secondary | ICD-10-CM | POA: Diagnosis not present

## 2023-09-15 DIAGNOSIS — J302 Other seasonal allergic rhinitis: Secondary | ICD-10-CM | POA: Diagnosis not present

## 2023-09-16 ENCOUNTER — Encounter: Payer: Self-pay | Admitting: Physician Assistant

## 2023-09-16 ENCOUNTER — Ambulatory Visit: Admitting: Physician Assistant

## 2023-09-16 VITALS — BP 105/71 | HR 74 | Temp 99.1°F | Ht 66.0 in | Wt 170.0 lb

## 2023-09-16 DIAGNOSIS — J209 Acute bronchitis, unspecified: Secondary | ICD-10-CM | POA: Diagnosis not present

## 2023-09-16 MED ORDER — METHYLPREDNISOLONE 4 MG PO TBPK: ORAL_TABLET | ORAL | 0 refills | Status: DC

## 2023-09-16 MED ORDER — PROMETHAZINE-PHENYLEPHRINE 6.25-5 MG/5ML PO SYRP: 5.0000 mL | ORAL_SOLUTION | ORAL | 0 refills | Status: DC | PRN

## 2023-09-16 MED ORDER — ALBUTEROL SULFATE HFA 108 (90 BASE) MCG/ACT IN AERS: 1.0000 | INHALATION_SPRAY | Freq: Four times a day (QID) | RESPIRATORY_TRACT | 0 refills | Status: DC | PRN

## 2023-09-16 NOTE — Patient Instructions (Addendum)
 VISIT SUMMARY:  Today, you were seen for a persistent cough and congestion that started on September 02, 2023. Your symptoms initially seemed like allergies but have since developed into a productive cough with thick mucus and sinus congestion. You also have a history of bronchitis from earlier this year.  YOUR PLAN:  -ACUTE BRONCHITIS: Acute bronchitis is an inflammation of the bronchial tubes, usually caused by a viral infection. To help reduce the inflammation, you have been prescribed a prednisone  dose pack. For relief from your cough, you will take a prescribed cough syrup. Additionally, you will use an albuterol  inhaler to help with any bronchospasms. Continue using Flonase twice daily for nasal congestion, and try steamy showers and Vicks VapoRub for further symptom relief.  -ALLERGIC RHINITIS: Allergic rhinitis is an allergic reaction that causes sneezing, congestion, and a runny nose. You should continue taking Claritin daily and using Flonase to manage your symptoms.   Acute Bronchitis, Adult  Acute bronchitis is sudden inflammation of the main airways (bronchi) that come off the windpipe (trachea) in the lungs. The swelling causes the airways to get smaller and make more mucus than normal. This can make it hard to breathe and can cause coughing or noisy breathing (wheezing). Acute bronchitis may last several weeks. The cough may last longer. Allergies, asthma, and exposure to smoke may make the condition worse. What are the causes? This condition can be caused by germs and by substances that irritate the lungs, including: Cold and flu viruses. The most common cause of this condition is the virus that causes the common cold. Bacteria. This is less common. Breathing in substances that irritate the lungs, including: Smoke from cigarettes and other forms of tobacco. Dust and pollen. Fumes from household cleaning products, gases, or burned fuel. Indoor or outdoor air pollution. What  increases the risk? The following factors may make you more likely to develop this condition: A weak body's defense system, also called the immune system. A condition that affects your lungs and breathing, such as asthma. What are the signs or symptoms? Common symptoms of this condition include: Coughing. This may bring up clear, yellow, or green mucus from your lungs (sputum). Wheezing. Runny or stuffy nose. Having too much mucus in your lungs (chest congestion). Shortness of breath. Aches and pains, including sore throat or chest. How is this diagnosed? This condition is usually diagnosed based on: Your symptoms and medical history. A physical exam. You may also have other tests, including tests to rule out other conditions, such as pneumonia. These tests include: A test of lung function. Test of a mucus sample to look for the presence of bacteria. Tests to check the oxygen level in your blood. Blood tests. Chest X-ray. How is this treated? Most cases of acute bronchitis clear up over time without treatment. Your health care provider may recommend: Drinking more fluids to help thin your mucus so it is easier to cough up. Taking inhaled medicine (inhaler) to improve air flow in and out of your lungs. Using a vaporizer or a humidifier. These are machines that add water to the air to help you breathe better. Taking a medicine that thins mucus and clears congestion (expectorant). Taking a medicine that prevents or stops coughing (cough suppressant). It is not common to take an antibiotic medicine for this condition. Follow these instructions at home:  Take over-the-counter and prescription medicines only as told by your health care provider. Use an inhaler, vaporizer, or humidifier as told by your health care provider.  Take two teaspoons (10 mL) of honey at bedtime to lessen coughing at night. Drink enough fluid to keep your urine pale yellow. Do not use any products that contain  nicotine or tobacco. These products include cigarettes, chewing tobacco, and vaping devices, such as e-cigarettes. If you need help quitting, ask your health care provider. Get plenty of rest. Return to your normal activities as told by your health care provider. Ask your health care provider what activities are safe for you. Keep all follow-up visits. This is important. How is this prevented? To lower your risk of getting this condition again: Wash your hands often with soap and water for at least 20 seconds. If soap and water are not available, use hand sanitizer. Avoid contact with people who have cold symptoms. Try not to touch your mouth, nose, or eyes with your hands. Avoid breathing in smoke or chemical fumes. Breathing smoke or chemical fumes will make your condition worse. Get the flu shot every year. Contact a health care provider if: Your symptoms do not improve after 2 weeks. You have trouble coughing up the mucus. Your cough keeps you awake at night. You have a fever. Get help right away if you: Cough up blood. Feel pain in your chest. Have severe shortness of breath. Faint or keep feeling like you are going to faint. Have a severe headache. Have a fever or chills that get worse. These symptoms may represent a serious problem that is an emergency. Do not wait to see if the symptoms will go away. Get medical help right away. Call your local emergency services (911 in the [place] Do not drive yourself to the hospital. Summary Acute bronchitis is inflammation of the main airways (bronchi) that come off the windpipe (trachea) in the lungs. The swelling causes the airways to get smaller and make more mucus than normal. Drinking more fluids can help thin your mucus so it is easier to cough up. Take over-the-counter and prescription medicines only as told by your health care provider. Do not use any products that contain nicotine or tobacco. These products include cigarettes, chewing  tobacco, and vaping devices, such as e-cigarettes. If you need help quitting, ask your health care provider. Contact a health care provider if your symptoms do not improve after 2 weeks. This information is not intended to replace advice given to you by your health care provider. Make sure you discuss any questions you have with your health care provider. Document Revised: 08/02/2021 Document Reviewed: 08/23/2020 Elsevier Patient Education  2024 [employer].

## 2023-09-16 NOTE — Progress Notes (Signed)
 Established Patient Office Visit  Subjective   Patient ID: Tracy Cohen, female    DOB: 1990/09/29  Age: 33 y.o. MRN: [id number]  Chief Complaint  Patient presents with   URI    Irritated throat, symptoms started 2 weeks ago. Patinet has been treated with OTC medications with no relief. Within the past few days she has noticed increase in coughing at night    Discussed the use of AI scribe software for clinical note transcription with the patient, who gave verbal consent to proceed.  History of Present Illness   Tracy Cohen is a 33 year old female who presents with persistent cough and congestion.  Symptoms began on September 02, 2023, with allergy symptoms, initially managed with Claritin. Two weeks ago, congestion and a constant cough developed, unresponsive to Claritin and Mucinex. The cough is productive of thick, brownish mucus. She experiences intermittent chills but no fever. Throat irritation affects her desire to eat and drink, though she maintains hydration.  She has a history of bronchitis from February 2025, previously managed with an albuterol  inhaler. No ear pain or fullness is present, but there is a sensation of sinus congestion. Nasal discharge is clear to slightly yellow. Flonase is used for nasal congestion.  She has difficulty sleeping due to symptoms, particularly at night when her baby wakes up.     Past Medical History:  Diagnosis Date   Allergy    Anxiety    Depression    Migraine    Social History   Socioeconomic History   Marital status: Single    Spouse name: Not on file   Number of children: 0   Years of education: 38   Highest education level: Associate degree: occupational, Scientist, product/process development, or vocational program  Occupational History    Employer: [employer]  Tobacco Use   Smoking status: Former    Types: Cigarettes   Smokeless tobacco: Never  Vaping Use   Vaping status: Never Used  Substance and Sexual Activity   Alcohol use: Not  Currently    Comment: 6/week (on weekend)   Drug use: Not Currently   Sexual activity: Yes    Birth control/protection: None  Other Topics Concern   Not on file  Social History Narrative   Patient lives at home alone.    Patient works for [employer]   Patient has some college education.   Right handed.      Patient now works for [employer] and has received an associated an degree.   Caffeine 16 oz daily   One story home      Social Drivers of Health   Financial Resource Strain: Not on file  Food Insecurity: No Food Insecurity (08/02/2021)   Hunger Vital Sign    Worried About Running Out of Food in the Last Year: Never true    Ran Out of Food in the Last Year: Never true  Transportation Needs: No Transportation Needs (08/02/2021)   PRAPARE - Administrator, Civil Service (Medical): No    Lack of Transportation (Non-Medical): No  Physical Activity: Not on file  Stress: Not on file  Social Connections: Not on file  Intimate Partner Violence: Not on file   Family History  Problem Relation Age of Onset   High blood pressure Mother    Mental illness Father        schizophrenia   Esophageal cancer Father    Stroke Maternal Grandmother    Mental illness Maternal Grandmother  bipolar   Kidney cancer Maternal Grandmother    Cancer Maternal Grandfather    Prostate cancer Maternal Grandfather    Colon cancer Maternal Grandfather    Mental illness Paternal Grandmother    Cancer Paternal Grandmother    Kidney cancer Paternal Grandmother    Mental illness Paternal Grandfather    No Known Allergies  Review of Systems  Constitutional:  Positive for chills. Negative for fever.  HENT:  Positive for congestion.   Eyes: Negative.   Respiratory:  Positive for cough and sputum production. Negative for shortness of breath.   Cardiovascular:  Negative for chest pain.  Gastrointestinal:  Negative for abdominal pain and nausea.  Genitourinary: Negative.    Musculoskeletal:  Negative for myalgias.  Skin: Negative.   Neurological: Negative.   Endo/Heme/Allergies: Negative.   Psychiatric/Behavioral: Negative.        Objective:     BP 105/71 (BP Location: Left Arm, Patient Position: Sitting, Cuff Size: Large)   Pulse 74   Temp 99.1 F (37.3 C)   Ht 5\' 6"  (1.676 m)   Wt 170 lb (77.1 kg)   SpO2 98%   BMI 27.44 kg/m  BP Readings from Last 3 Encounters:  09/16/23 105/71  09/04/23 112/69  06/21/23 124/82   Wt Readings from Last 3 Encounters:  09/16/23 170 lb (77.1 kg)  09/04/23 171 lb (77.6 kg)  11/14/22 180 lb 9.6 oz (81.9 kg)    Physical Exam Vitals and nursing note reviewed.  Constitutional:      Appearance: Normal appearance.  HENT:     Head: Normocephalic.     Salivary Glands: Right salivary gland is not diffusely enlarged or tender. Left salivary gland is not diffusely enlarged or tender.     Right Ear: Tympanic membrane, ear canal and external ear normal.     Left Ear: Tympanic membrane, ear canal and external ear normal.     Nose:     Right Turbinates: Enlarged and swollen.     Left Turbinates: Enlarged and swollen.     Right Sinus: Maxillary sinus tenderness present. No frontal sinus tenderness.     Left Sinus: Maxillary sinus tenderness present. No frontal sinus tenderness.     Mouth/Throat:     Lips: Pink.     Mouth: Mucous membranes are moist.     Pharynx: No posterior oropharyngeal erythema.  Eyes:     Extraocular Movements: Extraocular movements intact.     Conjunctiva/sclera: Conjunctivae normal.     Pupils: Pupils are equal, round, and reactive to light.  Cardiovascular:     Rate and Rhythm: Normal rate and regular rhythm.     Pulses: Normal pulses.     Heart sounds: Normal heart sounds.  Pulmonary:     Effort: Pulmonary effort is normal.     Breath sounds: Normal breath sounds. No wheezing.  Musculoskeletal:        General: Normal range of motion.     Cervical back: Normal range of motion and neck  supple.  Skin:    General: Skin is warm and dry.  Neurological:     General: No focal deficit present.     Mental Status: She is alert.  Psychiatric:        Mood and Affect: Mood normal.        Behavior: Behavior normal.        Thought Content: Thought content normal.        Judgment: Judgment normal.        Assessment &  Plan:   Problem List Items Addressed This Visit   None Visit Diagnoses       Acute bronchitis, unspecified organism    -  Primary   Relevant Medications   methylPREDNISolone  (MEDROL  DOSEPAK) 4 MG TBPK tablet   promethazine -phenylephrine  6.25-5 MG/5ML SYRP   albuterol  (VENTOLIN  HFA) 108 (90 Base) MCG/ACT inhaler     1. Acute bronchitis, unspecified organism (Primary)  Acute bronchitis with congestion and persistent cough, likely viral. Antibiotics not indicated. Steroid taper recommended to reduce inflammation. - Advise use of Flonase twice daily for nasal congestion and continue Claritin. (Previously prescribed) - Recommend steamy showers and Vicks VapoRub for symptomatic relief.  - methylPREDNISolone  (MEDROL  DOSEPAK) 4 MG TBPK tablet; Use per instructions on package  Dispense: 21 tablet; Refill: 0 - promethazine -phenylephrine  6.25-5 MG/5ML SYRP; Take 5 mLs by mouth every 4 (four) hours as needed for congestion.  Dispense: 118 mL; Refill: 0 - albuterol  (VENTOLIN  HFA) 108 (90 Base) MCG/ACT inhaler; Inhale 1-2 puffs into the lungs every 6 (six) hours as needed for shortness of breath or wheezing.  Dispense: 8 g; Refill: 0  Red flags given for prompt reevaluation   I have reviewed the patient's medical history (PMH, PSH, Social History, Family History, Medications, and allergies) , and have been updated if relevant. I spent 30 minutes reviewing chart and  face to face time with patient.    Return if symptoms worsen or fail to improve.    Etter Hermann Mayers, PA-C

## 2023-09-22 DIAGNOSIS — F4323 Adjustment disorder with mixed anxiety and depressed mood: Secondary | ICD-10-CM | POA: Diagnosis not present

## 2023-10-07 DIAGNOSIS — F4323 Adjustment disorder with mixed anxiety and depressed mood: Secondary | ICD-10-CM | POA: Diagnosis not present

## 2023-10-08 ENCOUNTER — Other Ambulatory Visit: Payer: Self-pay | Admitting: Physician Assistant

## 2023-10-08 DIAGNOSIS — J209 Acute bronchitis, unspecified: Secondary | ICD-10-CM

## 2023-10-11 ENCOUNTER — Encounter: Payer: Self-pay | Admitting: Family Medicine

## 2023-10-11 DIAGNOSIS — B9689 Other specified bacterial agents as the cause of diseases classified elsewhere: Secondary | ICD-10-CM

## 2023-10-13 ENCOUNTER — Telehealth

## 2023-10-13 ENCOUNTER — Telehealth: Payer: Self-pay | Admitting: Family Medicine

## 2023-10-13 ENCOUNTER — Telehealth: Admitting: Physician Assistant

## 2023-10-13 DIAGNOSIS — F4323 Adjustment disorder with mixed anxiety and depressed mood: Secondary | ICD-10-CM | POA: Diagnosis not present

## 2023-10-13 DIAGNOSIS — N907 Vulvar cyst: Secondary | ICD-10-CM

## 2023-10-13 DIAGNOSIS — F3175 Bipolar disorder, in partial remission, most recent episode depressed: Secondary | ICD-10-CM | POA: Diagnosis not present

## 2023-10-13 NOTE — Progress Notes (Signed)
   Thank you for the details you included in the comment boxes. Those details are very helpful in determining the best course of treatment for you and help us  to provide the best care.Because of having an issue that is not treated through the E-visit questionnaires, we recommend that you schedule a Virtual Urgent Care video visit in order for the provider to better assess what is going on.  The provider will be able to give you a more accurate diagnosis and treatment plan if we can more freely discuss your symptoms and with the addition of a virtual examination.   If you change your visit to a video visit, we will bill your insurance (similar to an office visit) and you will not be charged for this e-Visit. You will be able to stay at home and speak with the first available [facility] advanced practice provider. The link to do a video visit is in the drop down Menu tab of your Welcome screen in MyChart.        I have spent 5 minutes in review of e-visit questionnaire, review and updating patient chart, medical decision making and response to patient.   Angelia Kelp, PA-C

## 2023-10-13 NOTE — Telephone Encounter (Signed)
 Patient is requesting an antibiotic for a valvar cyst,  said it burst this morning and she is needing medication

## 2023-10-14 ENCOUNTER — Telehealth: Admitting: Physician Assistant

## 2023-10-14 DIAGNOSIS — L700 Acne vulgaris: Secondary | ICD-10-CM | POA: Diagnosis not present

## 2023-10-14 DIAGNOSIS — L239 Allergic contact dermatitis, unspecified cause: Secondary | ICD-10-CM | POA: Diagnosis not present

## 2023-10-14 DIAGNOSIS — N764 Abscess of vulva: Secondary | ICD-10-CM

## 2023-10-14 MED ORDER — METRONIDAZOLE 500 MG PO TABS: 500.0000 mg | ORAL_TABLET | Freq: Two times a day (BID) | ORAL | 0 refills | Status: DC

## 2023-10-14 MED ORDER — SULFAMETHOXAZOLE-TRIMETHOPRIM 800-160 MG PO TABS: 1.0000 | ORAL_TABLET | Freq: Two times a day (BID) | ORAL | 0 refills | Status: DC

## 2023-10-14 NOTE — Progress Notes (Signed)
 Virtual Visit Consent   Tracy Cohen, you are scheduled for a virtual visit with a [facility] provider today. Just as with appointments in the office, your consent must be obtained to participate. Your consent will be active for this visit and any virtual visit you may have with one of our providers in the next 365 days. If you have a MyChart account, a copy of this consent can be sent to you electronically.  As this is a virtual visit, video technology does not allow for your provider to perform a traditional examination. This may limit your provider's ability to fully assess your condition. If your provider identifies any concerns that need to be evaluated in person or the need to arrange testing (such as labs, EKG, etc.), we will make arrangements to do so. Although advances in technology are sophisticated, we cannot ensure that it will always work on either your end or our end. If the connection with a video visit is poor, the visit may have to be switched to a telephone visit. With either a video or telephone visit, we are not always able to ensure that we have a secure connection.  By engaging in this virtual visit, you consent to the provision of healthcare and authorize for your insurance to be billed (if applicable) for the services provided during this visit. Depending on your insurance coverage, you may receive a charge related to this service.  I need to obtain your verbal consent now. Are you willing to proceed with your visit today? Tracy Cohen has provided verbal consent on 10/14/2023 for a virtual visit (video or telephone). Hyla Maillard, [place]  Date: 10/14/2023 8:10 AM   Virtual Visit via Video Note   I, Hyla Maillard, connected with  Tracy Cohen  ([id number], 19-Mar-1991) on 10/14/23 at  8:00 AM EDT by a video-enabled telemedicine application and verified that I am speaking with the correct person using two identifiers.  Location: Patient: Virtual Visit Location  Patient: Home Provider: Virtual Visit Location Provider: Home Office   I discussed the limitations of evaluation and management by telemedicine and the availability of in person appointments. The patient expressed understanding and agreed to proceed.    History of Present Illness: Tracy Cohen is a 33 y.o. who identifies as a female who was assigned female at birth, and is being seen today for vulvar cyst/abscess. Notes she has Hx of vulvar cyst during pregnancy 2 years ago. Drained on its own at that time but due to pregnancy and discharge was evaluated and given antibiotics. Never required I/D. Notes since Saturday having pain and swelling at the top of her vaginal, just inside the labia. Notes a swollen mump in that area that has now started draining on its own. Tenderness is 2-3/10. Denies fever, chills. Denies other vaginal symptoms. Unable to get in with GYN today.  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   Cervical lymphadenopathy 03/31/2017   GAD (generalized anxiety disorder) 12/13/2013   Migraine 04/23/2013    Allergies: No Known Allergies Medications:  Current Outpatient Medications:    etonogestrel  (NEXPLANON ) 68 MG IMPL implant, 1 each by Subdermal route once., Disp: ,  Rfl:    sulfamethoxazole -trimethoprim  (BACTRIM  DS) 800-160 MG tablet, Take 1 tablet by mouth 2 (two) times daily., Disp: 14 tablet, Rfl: 0   albuterol  (VENTOLIN  HFA) 108 (90 Base) MCG/ACT inhaler, INHALE 1-2 PUFFS INTO THE LUNGS EVERY 6 (SIX) HOURS AS NEEDED FOR SHORTNESS OF BREATH OR WHEEZING., Disp: 6.7 each, Rfl: 3   CAPLYTA 42 MG capsule, Take 42 mg by mouth daily.,  Disp: , Rfl:    polyethylene glycol powder (GLYCOLAX /MIRALAX ) 17 GM/SCOOP powder, Take 17 g by mouth daily as needed., Disp: 510 g, Rfl: 1  Observations/Objective: Patient is well-developed, well-nourished in no acute distress.  Resting comfortably  at home.  Head is normocephalic, atraumatic.  No labored breathing.  Speech is clear and coherent with logical content.  Patient is alert and oriented at baseline.   Assessment and Plan: 1. Vulvar abscess (Primary) - sulfamethoxazole -trimethoprim  (BACTRIM  DS) 800-160 MG tablet; Take 1 tablet by mouth 2 (two) times daily.  Dispense: 14 tablet; Refill: 0  Will start antibiotics as is draining on its own, mild tenderness and patient afebrile. Needs GYN follow-up as this is a recurring issue. ER precautions reviewed.   Follow Up Instructions: I discussed the assessment and treatment plan with the patient. The patient was provided an opportunity to ask questions and all were answered. The patient agreed with the plan and demonstrated an understanding of the instructions.  A copy of instructions were sent to the patient via MyChart unless otherwise noted below.   The patient was advised to call back or seek an in-person evaluation if the symptoms worsen or if the condition fails to improve as anticipated.    Hyla Maillard, PA-C

## 2023-10-14 NOTE — Patient Instructions (Signed)
  Arnett Lanius, thank you for joining Hyla Maillard, PA-C for today's virtual visit.  While this provider is not your primary care provider (PCP), if your PCP is located in our provider database this encounter information will be shared with them immediately following your visit.   A [facility] MyChart account gives you access to today's visit and all your visits, tests, and labs performed at [facility] " click here if you don't have a [facility] MyChart account or go to [email or web address]  Consent: (Patient) Tracy Cohen provided verbal consent for this virtual visit at the beginning of the encounter.  Current Medications:  Current Outpatient Medications:    etonogestrel  (NEXPLANON ) 68 MG IMPL implant, 1 each by Subdermal route once., Disp: , Rfl:    sulfamethoxazole -trimethoprim  (BACTRIM  DS) 800-160 MG tablet, Take 1 tablet by mouth 2 (two) times daily., Disp: 14 tablet, Rfl: 0   albuterol  (VENTOLIN  HFA) 108 (90 Base) MCG/ACT inhaler, INHALE 1-2 PUFFS INTO THE LUNGS EVERY 6 (SIX) HOURS AS NEEDED FOR SHORTNESS OF BREATH OR WHEEZING., Disp: 6.7 each, Rfl: 3   CAPLYTA 42 MG capsule, Take 42 mg by mouth daily., Disp: , Rfl:    polyethylene glycol powder (GLYCOLAX /MIRALAX ) 17 GM/SCOOP powder, Take 17 g by mouth daily as needed., Disp: 510 g, Rfl: 1   Medications ordered in this encounter:  Meds ordered this encounter  Medications   sulfamethoxazole -trimethoprim  (BACTRIM  DS) 800-160 MG tablet    Sig: Take 1 tablet by mouth 2 (two) times daily.    Dispense:  14 tablet    Refill:  0    Supervising Provider:   Corine Dice [[id number]]     *If you need refills on other medications prior to your next appointment, please contact your pharmacy*  Follow-Up: Call back or seek an in-person evaluation if the symptoms worsen or if the condition fails to improve as anticipated.  [facility] [phone]  Other Instructions Please take the antibiotic  as directed. Soaking in a warm bath can help ease pain and promote drainage as well. If not quickly resolving, or any new/worsening symptoms you need an in-person evaluation ASAP. Giving that you have had this issue a few times now, I recommend scheduling a follow-up with your GYN.   If you have been instructed to have an in-person evaluation today at a local Urgent Care facility, please use the link below. It will take you to a list of all of our available [facility], including address, phone number and hours of operation. Please do not delay care.  [facility]  If you or a family member do not have a primary care provider, use the link below to schedule a visit and establish care. When you choose a [facility] primary care physician or advanced practice provider, you gain a long-term partner in health. Find a Primary Care Provider  Learn more about [facility]'s in-office and virtual care options:  - Get Care Now

## 2023-10-20 ENCOUNTER — Telehealth: Payer: Self-pay | Admitting: Family Medicine

## 2023-10-20 NOTE — Telephone Encounter (Signed)
 The patient called, expressing frustration that she had been unable to reach anyone. She stated that she was told a member of the clinical staff would call her back, but she had not received a call yet. She requested an appointment and mentioned that she is a [facility] patient.  I offered her an appointment with Dr. Cersenzo on Friday, 06/20, but she declined, stating he is not her doctor. She continued speaking for a while before I could respond. As I attempted to explain that the next available appointment with Dr. Daisey Dryer was on Monday morning, 06/23, she shared that she is a single mother and expressed fear about her health, saying she did not want to die in her sleep.  She then asked to speak to a nurse, and I assured her that I would inform the clinical staff to return her call. However, she hung up before scheduling an appointment.

## 2023-10-21 ENCOUNTER — Telehealth: Payer: Self-pay

## 2023-10-21 NOTE — Telephone Encounter (Signed)
 Called Pt to confirm that she received Newest appointment on 10/27/23 via My Chart. Pt verbalized that she did see appointment on My Chart.

## 2023-10-24 DIAGNOSIS — F3175 Bipolar disorder, in partial remission, most recent episode depressed: Secondary | ICD-10-CM | POA: Diagnosis not present

## 2023-10-27 ENCOUNTER — Ambulatory Visit: Admitting: Family Medicine

## 2023-10-27 ENCOUNTER — Other Ambulatory Visit: Payer: Self-pay

## 2023-10-27 ENCOUNTER — Other Ambulatory Visit (HOSPITAL_COMMUNITY)
Admission: RE | Admit: 2023-10-27 | Discharge: 2023-10-27 | Disposition: A | Discharge status: Home / Self Care | Source: Ambulatory Visit | Attending: Family Medicine | Admitting: Family Medicine

## 2023-10-27 ENCOUNTER — Encounter: Payer: Self-pay | Admitting: Family Medicine

## 2023-10-27 VITALS — BP 118/72 | HR 72 | Wt 167.0 lb

## 2023-10-27 DIAGNOSIS — N760 Acute vaginitis: Secondary | ICD-10-CM | POA: Diagnosis not present

## 2023-10-27 DIAGNOSIS — N764 Abscess of vulva: Secondary | ICD-10-CM | POA: Diagnosis not present

## 2023-10-27 DIAGNOSIS — B9689 Other specified bacterial agents as the cause of diseases classified elsewhere: Secondary | ICD-10-CM

## 2023-10-27 NOTE — Progress Notes (Signed)
   GYNECOLOGY PROBLEM  VISIT ENCOUNTER NOTE  Subjective:   Tracy Cohen is a 33 y.o. G4P1011 female here for a problem GYN visit.  Current complaints: vulvar abscess  Started on 6/10, seen for virtual PCP visit Rx for bactrim  sent at that time.  Drainage was minimal. No pain currently.  Having some discharge. Denies fevers, chills.     Denies abnormal vaginal bleeding, discharge, pelvic pain, problems with intercourse or other gynecologic concerns.    Gynecologic History No LMP recorded. Patient has had an implant.  Contraception: Nexplanon   Health Maintenance Due  Topic Date Due   HPV VACCINES (1 - 3-dose SCDM series) Never done    The following portions of the patient's history were reviewed and updated as appropriate: allergies, current medications, past family history, past medical history, past social history, past surgical history and problem list.  Review of Systems Pertinent items are noted in HPI.   Objective:  BP 118/72   Pulse 72   Wt 167 lb (75.8 kg)   BMI 26.95 kg/m   Gen: well appearing, NAD HEENT: no scleral icterus CV: RR Lung: Normal WOB Ext: warm well perfused  PELVIC: Normal appearing external genitalia; normal appearing vaginal mucosa and cervix.  No abnormal discharge noted.    Normal uterine size, no other palpable masses, no uterine or adnexal tenderness. No evidence of persistent vulvar cyst or abscess   Assessment and Plan:   1. BV (bacterial vaginosis) - Cervicovaginal ancillary only( [place])  2. Vulvar abscess (Primary) Resolved, no concerns.   Please refer to After Visit Summary for other counseling recommendations.   No follow-ups on file.  Suzen Maryan Masters, MD, MPH, ABFM Attending Physician Faculty Practice- [facility] for [facility]

## 2023-10-28 ENCOUNTER — Ambulatory Visit: Payer: Self-pay | Admitting: Family Medicine

## 2023-10-28 LAB — CERVICOVAGINAL ANCILLARY ONLY
Bacterial Vaginitis (gardnerella): POSITIVE — AB
Candida Glabrata: NEGATIVE
Candida Vaginitis: NEGATIVE
Chlamydia: NEGATIVE
Comment: NEGATIVE
Comment: NEGATIVE
Comment: NEGATIVE
Comment: NEGATIVE
Comment: NEGATIVE
Comment: NORMAL
Neisseria Gonorrhea: NEGATIVE
Trichomonas: NEGATIVE

## 2023-10-29 ENCOUNTER — Other Ambulatory Visit: Payer: Self-pay

## 2023-10-29 DIAGNOSIS — B9689 Other specified bacterial agents as the cause of diseases classified elsewhere: Secondary | ICD-10-CM

## 2023-10-29 MED ORDER — METRONIDAZOLE 1 % EX GEL: Freq: Every day | CUTANEOUS | 0 refills | Status: DC

## 2023-10-30 ENCOUNTER — Telehealth: Payer: Self-pay | Admitting: Family Medicine

## 2023-10-30 ENCOUNTER — Other Ambulatory Visit: Payer: Self-pay

## 2023-10-30 DIAGNOSIS — B9689 Other specified bacterial agents as the cause of diseases classified elsewhere: Secondary | ICD-10-CM

## 2023-10-30 MED ORDER — METRONIDAZOLE 0.75 % VA GEL: 1.0000 | Freq: Every day | VAGINAL | 0 refills | Status: AC

## 2023-10-30 NOTE — Telephone Encounter (Signed)
 Received a call from patient requesting her medical records for her and her daughter. She wanted to know what she needed to do. I informed her she would need to go tot he office and sign a ROI for her and her daughter. She wanted to know how long the processed took. I told her I wasn't sure because we would need to send her request to medical records. Patient hung up.

## 2023-10-30 NOTE — Telephone Encounter (Signed)
 [facility], prescription sent to pharmacy today 10/30/23

## 2023-10-30 NOTE — Telephone Encounter (Signed)
 Patient called saying that she was given the wrong prescription for what she came in for on Monday and she already picked it up and paid for it. She is also upset about having to pay her copay and having to wait in the lobby for a long time with her child. After speaking with my team lead we gave her the office managers number because the patient says she needs to speak to someone higher up. Message was also sent to the clinical staff about her prescription.

## 2023-10-30 NOTE — Progress Notes (Addendum)
 NEUROLOGY FOLLOW UP OFFICE NOTE  Tracy Cohen [id number]  Assessment/Plan:   Migraine without aura, without status migrainosus, not intractable - now daily - may be triggered by the Caplyta and lithium, the weather or both.   1.  Prednisone  taper to break current daily cycle. 2.  Start Ajovy  every 28 days. If no improvement in 3 months, she will contact me and we can change management (such as retrying Aimovig  or try Qulipta) 3.  Nurtec as needed (samples provided.  She will let me know if still effective). 4.  Follow up in 8 months   Subjective:  Tracy Cohen is a 33 year old female pregnant at [redacted] weeks gestation who follows up for migraines.[name]   UPDATE: Last seen in December 2023.  At that time, she was prescribed Ajovy  but never started it because her migraines were controlled.  However, she started having headaches again 3 months ago.  They started occurring after recently starting lithium and Caplyta.  She develops a 8-9/10 bifrontal squeezing and pounding headache a after getting ready in the morning.  No associated symptoms that she has with her typical migraines such as dizziness, nausea, vomiting, blurred vision or numbness in the legs. Lasts 4 to 5 hours.  She would take Extra-strength Tylenol  but tries not to take it.  Initially occurred 1 to 2 days a week.  However, they have been daily for the last 2 weeks.  Would only take Tylenol  once or twice a week but taking daily for the last 2 weeks.    Current NSAIDS: none Current analgesics:  Tylenol  Current triptans:  none Current ergotamine:  none Current anti-emetic:  none Current muscle relaxants:  none Current anti-anxiolytic:  none Current sleep aide:  none Current Antihypertensive medications:  none Current Antidepressant/antipsychotic none Current Anticonvulsant medications:  none Current anti-CGRP:  prenatal Current Vitamins/Herbal/Supplements:  none Current Antihistamines/Decongestants:  Claritin Other therapy:   none Hormone/birth control:  Nexplanon  Other medications:  lithium carbonate, Caplyta   Caffeine:  No Alcohol:  no Smoker:  no Diet:  Water, protein, vegetables Exercise:  Yes.  Has a trainer. Depression:  yes; Anxiety:  yes Other pain:  no Sleep hygiene:  ok     HISTORY:  Onset:  Occasionally from age 62 through teenager.  Became more frequent in her mid 66s. Location:  Start bifrontal/temporal Quality:  Pounding and squeezing Intensity:  10/10.  She denies new headache, thunderclap headache  Aura:  no Premonitory Phase: no Postdrome: no Associated symptoms:  Dizziness, lightheadedness, nausea, vomiting, photophobia, phonophobia, osmophobia, vision loss (blurred vision to black), numbness in legs.  She denies associated unilateral weakness. Duration:  Usually 48 to 72 hours.  Anywhere from 1 1/2 days up to 3 months Frequency:  2 to 3 times a month (10 days a month) Frequency of abortive medication: 10 days a month Triggers:  Anything with red dye, sometimes caffeine, aged cheeses Relieving factors:  Laying down in dark and quiet room Activity:  Aggravates.  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   Past NSAIDS:  ibuprofen , naproxen  Past analgesics:  Excedrin, Tylenol  Past abortive triptans:  Maxalt , Zomig , sumatriptan  tablet Past abortive ergotamine:  none Past muscle relaxants:  none Past anti-emetic:  Zofran , Promethazine  Past antihypertensive medications:  none Past antidepressant/antipsychotic medications:  Nortriptyline , Seroquel  Past anticonvulsant medications:  topiramate  Past anti-CGRP:  Aimovig  70mg ; Nurtec PRN (effective) Past vitamins/Herbal/Supplements:  none Past antihistamines/decongestants:  none Other past therapies:  none     Family history of headache:  Mom (migraines later in  life)  PAST MEDICAL HISTORY: Past Medical History:  Diagnosis Date   Allergy    Anxiety    Depression    Migraine     MEDICATIONS: Current Outpatient Medications on File Prior to Visit  Medication Sig Dispense Refill   albuterol  (VENTOLIN  HFA) 108 (90 Base) MCG/ACT inhaler INHALE 1-2 PUFFS INTO THE LUNGS EVERY 6 (SIX) HOURS AS NEEDED FOR SHORTNESS OF BREATH OR WHEEZING. 6.7 each 3   CAPLYTA 42 MG capsule Take 42 mg by mouth daily.     etonogestrel  (NEXPLANON ) 68 MG IMPL implant 1 each by Subdermal route once.     metroNIDAZOLE  (FLAGYL ) 500 MG tablet Take 1 tablet (500 mg total) by mouth 2 (two) times daily. (Patient not taking: Reported on 10/27/2023) 14 tablet 0   metroNIDAZOLE  (METROGEL ) 0.75 % vaginal gel Place 1 Applicatorful vaginally at bedtime for 5 days. 50 g 0   polyethylene glycol powder (GLYCOLAX /MIRALAX ) 17 GM/SCOOP powder Take 17 g by mouth daily as needed. 510 g 1   sulfamethoxazole -trimethoprim  (BACTRIM  DS) 800-160 MG tablet Take 1 tablet by mouth 2 (two) times daily. 14 tablet 0   No current facility-administered medications on file prior to visit.    ALLERGIES: No Known Allergies  FAMILY HISTORY: Family History  Problem Relation Age of Onset   High blood pressure Mother    Mental illness Father        schizophrenia   Esophageal cancer Father    Stroke Maternal Grandmother    Mental illness Maternal Grandmother        bipolar   Kidney cancer Maternal Grandmother    Cancer Maternal Grandfather    Prostate cancer Maternal Grandfather    Colon cancer Maternal Grandfather    Mental illness Paternal Grandmother    Cancer Paternal Grandmother    Kidney cancer Paternal Grandmother    Mental illness Paternal Grandfather       Objective:  Blood pressure 124/79, pulse 74, height 5' 5 (1.651 m), weight 166 lb (75.3 kg), SpO2 100%. General: No acute distress.  Patient appears well-groomed.   Head:  Normocephalic/atraumatic Neck:  Supple.  No paraspinal  tenderness.  Full range of motion. Heart:  Regular rate and rhythm. Neuro:  Alert and oriented.  Speech fluent and not dysarthric.  Language intact.  CN II-XII intact.  Bulk and tone normal.  Muscle strength 5/5 throughout.  Sensation to light touch intact.  Deep tendon reflexes 2+ throughout, toes downgoing.  Gait normal.  Romberg negative.    Juliene Dunnings, DO  CC: Raguel Blush, MD

## 2023-10-30 NOTE — Telephone Encounter (Signed)
 Patient had an appt with Dr.Newton on Monday and says she was sent the wrong prescription after she already picked it up and paid for it.

## 2023-10-31 ENCOUNTER — Telehealth: Payer: Self-pay

## 2023-10-31 ENCOUNTER — Other Ambulatory Visit (HOSPITAL_BASED_OUTPATIENT_CLINIC_OR_DEPARTMENT_OTHER): Payer: Self-pay

## 2023-10-31 ENCOUNTER — Ambulatory Visit (INDEPENDENT_AMBULATORY_CARE_PROVIDER_SITE_OTHER): Admitting: Neurology

## 2023-10-31 ENCOUNTER — Other Ambulatory Visit (HOSPITAL_COMMUNITY): Payer: Self-pay

## 2023-10-31 ENCOUNTER — Encounter: Payer: Self-pay | Admitting: Neurology

## 2023-10-31 VITALS — BP 124/79 | HR 74 | Ht 65.0 in | Wt 166.0 lb

## 2023-10-31 DIAGNOSIS — G43009 Migraine without aura, not intractable, without status migrainosus: Secondary | ICD-10-CM | POA: Diagnosis not present

## 2023-10-31 MED ORDER — NURTEC 75 MG PO TBDP: ORAL_TABLET | ORAL | 0 refills | Status: DC

## 2023-10-31 MED ORDER — AJOVY 225 MG/1.5ML ~~LOC~~ SOAJ: 225.0000 mg | SUBCUTANEOUS | 11 refills | Status: AC

## 2023-10-31 MED ORDER — PREDNISONE 10 MG PO TABS: ORAL_TABLET | ORAL | 0 refills | Status: AC

## 2023-10-31 NOTE — Telephone Encounter (Signed)
 Patient seen today, Patient to start ajovy .   Ajovy  PA needed.

## 2023-10-31 NOTE — Telephone Encounter (Signed)
 Pharmacy Patient Advocate Encounter   Received notification from Pt Calls Messages that prior authorization for AJOVY  (fremanezumab -vfrm) injection 225MG /1.5ML auto-injectors is required/requested.   Insurance verification completed.   The patient is insured through [facility] .   Prior Authorization for AJOVY  (fremanezumab -vfrm) injection 225MG /1.5ML auto-injectors has been APPROVED from 10-31-2023 to 01-29-2024   PA #/Case ID/Reference #: [id number]

## 2023-10-31 NOTE — Patient Instructions (Addendum)
  Prednisone  taper Start Ajovy  injection every 28 days.  Contact us  in 3 months with update and we can increase dose if needed. Take Nurtec at earliest onset of headache. Maximum 1 tablet in 24 hours. Limit use of pain relievers to no more than 9 days out of the month.  These medications include acetaminophen , NSAIDs (ibuprofen /Advil /Motrin , naproxen /Aleve , triptans (Imitrex /sumatriptan ), Excedrin, and narcotics.  This will help reduce risk of rebound headaches. Be aware of common food triggers Routine exercise Stay adequately hydrated (aim for 64 oz water daily) Keep headache diary Maintain proper stress management Maintain proper sleep hygiene Do not skip meals

## 2023-10-31 NOTE — Addendum Note (Signed)
 Addended by: SKEET, Tamila Gaulin R on: 10/31/2023 10:28 AM   Modules accepted: Orders

## 2023-11-04 DIAGNOSIS — F4323 Adjustment disorder with mixed anxiety and depressed mood: Secondary | ICD-10-CM | POA: Diagnosis not present

## 2023-11-05 ENCOUNTER — Other Ambulatory Visit (HOSPITAL_COMMUNITY): Payer: Self-pay

## 2023-11-11 DIAGNOSIS — F4323 Adjustment disorder with mixed anxiety and depressed mood: Secondary | ICD-10-CM | POA: Diagnosis not present

## 2023-11-14 ENCOUNTER — Encounter: Admitting: Family Medicine

## 2023-11-19 DIAGNOSIS — F4323 Adjustment disorder with mixed anxiety and depressed mood: Secondary | ICD-10-CM | POA: Diagnosis not present

## 2023-11-20 DIAGNOSIS — F3175 Bipolar disorder, in partial remission, most recent episode depressed: Secondary | ICD-10-CM | POA: Diagnosis not present

## 2023-11-25 DIAGNOSIS — F4323 Adjustment disorder with mixed anxiety and depressed mood: Secondary | ICD-10-CM | POA: Diagnosis not present

## 2023-12-25 DIAGNOSIS — F4323 Adjustment disorder with mixed anxiety and depressed mood: Secondary | ICD-10-CM | POA: Diagnosis not present

## 2023-12-26 DIAGNOSIS — F3175 Bipolar disorder, in partial remission, most recent episode depressed: Secondary | ICD-10-CM | POA: Diagnosis not present

## 2023-12-31 ENCOUNTER — Encounter: Admitting: Family Medicine

## 2024-01-07 DIAGNOSIS — Z01419 Encounter for gynecological examination (general) (routine) without abnormal findings: Secondary | ICD-10-CM | POA: Diagnosis not present

## 2024-01-13 ENCOUNTER — Telehealth: Payer: Self-pay

## 2024-01-13 ENCOUNTER — Other Ambulatory Visit (HOSPITAL_COMMUNITY): Payer: Self-pay

## 2024-01-13 NOTE — Telephone Encounter (Signed)
 Pharmacy Patient Advocate Encounter   Received notification from CoverMyMeds that prior authorization for AJOVY  (fremanezumab -vfrm) injection 225MG /1.5ML auto-injectors is required/requested.   Insurance verification completed.   The patient is insured through [facility] .   Per test claim: PA required; PA started via CoverMyMeds. KEY [id number] . Waiting for clinical questions to populate.

## 2024-01-15 NOTE — Telephone Encounter (Signed)
 Pharmacy Patient Advocate Encounter  Received notification from [facility] that Prior Authorization for  AJOVY  (fremanezumab -vfrm) injection 225MG /1.5ML auto-injectors has been APPROVED from 01-15-2024 to 04-14-2024   PA #/Case ID/Reference #: [id number]

## 2024-01-19 DIAGNOSIS — Z01419 Encounter for gynecological examination (general) (routine) without abnormal findings: Secondary | ICD-10-CM | POA: Diagnosis not present

## 2024-01-19 DIAGNOSIS — Z113 Encounter for screening for infections with a predominantly sexual mode of transmission: Secondary | ICD-10-CM | POA: Diagnosis not present

## 2024-01-19 DIAGNOSIS — Z1159 Encounter for screening for other viral diseases: Secondary | ICD-10-CM | POA: Diagnosis not present

## 2024-01-19 DIAGNOSIS — Z114 Encounter for screening for human immunodeficiency virus [HIV]: Secondary | ICD-10-CM | POA: Diagnosis not present

## 2024-01-22 DIAGNOSIS — F4323 Adjustment disorder with mixed anxiety and depressed mood: Secondary | ICD-10-CM | POA: Diagnosis not present

## 2024-01-26 ENCOUNTER — Encounter: Payer: Self-pay | Admitting: Family Medicine

## 2024-01-26 ENCOUNTER — Ambulatory Visit (INDEPENDENT_AMBULATORY_CARE_PROVIDER_SITE_OTHER): Admitting: Family Medicine

## 2024-01-26 VITALS — BP 108/72 | HR 68 | Ht 65.0 in | Wt 164.6 lb

## 2024-01-26 DIAGNOSIS — J302 Other seasonal allergic rhinitis: Secondary | ICD-10-CM

## 2024-01-26 DIAGNOSIS — F17200 Nicotine dependence, unspecified, uncomplicated: Secondary | ICD-10-CM | POA: Diagnosis not present

## 2024-01-26 DIAGNOSIS — F411 Generalized anxiety disorder: Secondary | ICD-10-CM | POA: Diagnosis not present

## 2024-01-26 DIAGNOSIS — F3176 Bipolar disorder, in full remission, most recent episode depressed: Secondary | ICD-10-CM | POA: Diagnosis not present

## 2024-01-26 MED ORDER — HYDROXYZINE PAMOATE 25 MG PO CAPS: 25.0000 mg | ORAL_CAPSULE | Freq: Three times a day (TID) | ORAL | 1 refills | Status: AC | PRN

## 2024-01-26 MED ORDER — SERTRALINE HCL 50 MG PO TABS: 50.0000 mg | ORAL_TABLET | Freq: Every day | ORAL | 3 refills | Status: AC

## 2024-01-26 NOTE — Progress Notes (Unsigned)
 Established Patient Office Visit  Subjective    Patient ID: Tracy Cohen, female    DOB: Mar 05, 1991  Age: 33 y.o. MRN: [id number]  CC:  Chief Complaint  Patient presents with   Medical Management of Chronic Issues    Pt has some allergy concerns and would like to discuss anxiety     HPI Tracy Cohen presents with complaint of anxiety. She reports some increased sx as of late but is not aware of any specific triggers. She has been on meds for anxiety in the past. She also reports seasonal allergy sx.   Outpatient Encounter Medications as of 01/26/2024  Medication Sig   albuterol  (VENTOLIN  HFA) 108 (90 Base) MCG/ACT inhaler INHALE 1-2 PUFFS INTO THE LUNGS EVERY 6 (SIX) HOURS AS NEEDED FOR SHORTNESS OF BREATH OR WHEEZING.   etonogestrel  (NEXPLANON ) 68 MG IMPL implant 1 each by Subdermal route once.   Fremanezumab -vfrm (AJOVY ) 225 MG/1.5ML SOAJ Inject 225 mg into the skin every 28 (twenty-eight) days.   hydrOXYzine  (VISTARIL ) 25 MG capsule Take 1 capsule (25 mg total) by mouth every 8 (eight) hours as needed.   polyethylene glycol powder (GLYCOLAX /MIRALAX ) 17 GM/SCOOP powder Take 17 g by mouth daily as needed.   Rimegepant Sulfate (NURTEC) 75 MG TBDP Medication Samples have been provided to the patient.  Drug name: Nurtec       Strength: 75mg         Qty: 2 boxes  LOT: [id number]  Exp.Date: 6/28 & 11/27  Dosing instructions:   The patient has been instructed regarding the correct time, dose, and frequency of taking this medication, including desired effects and most common side effects.   Tracy Cohen 8:32 AM 10/31/2023   sertraline  (ZOLOFT ) 50 MG tablet Take 1 tablet (50 mg total) by mouth daily.   CAPLYTA 42 MG capsule Take 42 mg by mouth daily. (Patient not taking: Reported on 01/26/2024)   lithium carbonate (ESKALITH) 450 MG ER tablet Take 450 mg by mouth 2 (two) times daily. (Patient not taking: Reported on 01/26/2024)   predniSONE  (DELTASONE ) 10 MG tablet Take 60mg  on  day 1, then 50mg  on day 2, then 40mg  on day 3, then 30mg  on day 4, then 20mg  on day 5, then 10mg  on day 6, then STOP (Patient not taking: Reported on 01/26/2024)   No facility-administered encounter medications on file as of 01/26/2024.    Past Medical History:  Diagnosis Date   Allergy    Anxiety    Depression    Migraine     Past Surgical History:  Procedure Laterality Date   ADENOIDECTOMY     TONSILLECTOMY      Family History  Problem Relation Age of Onset   High blood pressure Mother    Mental illness Father        schizophrenia   Esophageal cancer Father    Stroke Maternal Grandmother    Mental illness Maternal Grandmother        bipolar   Kidney cancer Maternal Grandmother    Cancer Maternal Grandfather    Prostate cancer Maternal Grandfather    Colon cancer Maternal Grandfather    Mental illness Paternal Grandmother    Cancer Paternal Grandmother    Kidney cancer Paternal Grandmother    Mental illness Paternal Grandfather     Social History   Socioeconomic History   Marital status: Single    Spouse name: Not on file   Number of children: 0   Years of education: 58  Highest education level: Associate degree: occupational, Scientist, product/process development, or vocational program  Occupational History    Employer: [employer]  Tobacco Use   Smoking status: Former    Types: Cigarettes   Smokeless tobacco: Never  Vaping Use   Vaping status: Never Used  Substance and Sexual Activity   Alcohol use: Not Currently    Comment: 6/week (on weekend)   Drug use: Not Currently   Sexual activity: Yes    Birth control/protection: None  Other Topics Concern   Not on file  Social History Narrative   Patient lives at home alone.    Patient works for [employer]   Patient has some college education.   Right handed.      Patient now works for [employer] and has received an associated an degree.   Caffeine 16 oz daily   One story home      Social Drivers of Health   Financial  Resource Strain: Not on file  Food Insecurity: No Food Insecurity (10/27/2023)   Hunger Vital Sign    Worried About Running Out of Food in the Last Year: Never true    Ran Out of Food in the Last Year: Never true  Transportation Needs: No Transportation Needs (10/27/2023)   PRAPARE - Administrator, Civil Service (Medical): No    Lack of Transportation (Non-Medical): No  Physical Activity: Insufficiently Active (01/26/2024)   Exercise Vital Sign    Days of Exercise per Week: 2 days    Minutes of Exercise per Session: 60 min  Stress: Stress Concern Present (01/26/2024)   [employer] of Occupational Health - Occupational Stress Questionnaire    Feeling of Stress: Very much  Social Connections: Moderately Isolated (01/26/2024)   Social Connection and Isolation Panel    Frequency of Communication with Friends and Family: More than three times a week    Frequency of Social Gatherings with Friends and Family: Twice a week    Attends Religious Services: Never    Database administrator or Organizations: Yes    Attends Engineer, structural: More than 4 times per year    Marital Status: Never married  Intimate Partner Violence: Not on file    Review of Systems  Psychiatric/Behavioral:  Positive for suicidal ideas. Negative for depression. The patient is nervous/anxious.   All other systems reviewed and are negative.       Objective    BP 108/72   Pulse 68   Ht 5' 5 (1.651 m)   Wt 164 lb 9.6 oz (74.7 kg)   LMP 01/19/2024 (Approximate)   SpO2 99%   BMI 27.39 kg/m   Physical Exam Vitals and nursing note reviewed.  Constitutional:      General: She is not in acute distress. Cardiovascular:     Rate and Rhythm: Normal rate and regular rhythm.  Pulmonary:     Effort: Pulmonary effort is normal.     Breath sounds: Normal breath sounds.  Abdominal:     Palpations: Abdomen is soft.     Tenderness: There is no abdominal tenderness.  Neurological:      General: No focal deficit present.     Mental Status: She is alert and oriented to person, place, and time.  Psychiatric:        Mood and Affect: Mood is anxious.         Assessment & Plan:  1. Anxiety state (Primary) Zoloft   and hydroxyzine  prescribed.   2. Seasonal allergies Patient  to continue flonase  3. Smoker Discussed reduction/cessation    Return in about 4 weeks (around 02/23/2024) for follow up.   Tanda Raguel SQUIBB, MD

## 2024-01-27 NOTE — Telephone Encounter (Signed)
 Pt's father rescheduled for sooner appt; called pt and left vm

## 2024-01-30 DIAGNOSIS — Z0279 Encounter for issue of other medical certificate: Secondary | ICD-10-CM

## 2024-02-02 DIAGNOSIS — F4323 Adjustment disorder with mixed anxiety and depressed mood: Secondary | ICD-10-CM | POA: Diagnosis not present

## 2024-02-12 ENCOUNTER — Encounter: Admitting: Family Medicine

## 2024-02-12 DIAGNOSIS — F3176 Bipolar disorder, in full remission, most recent episode depressed: Secondary | ICD-10-CM | POA: Diagnosis not present

## 2024-02-26 NOTE — Telephone Encounter (Signed)
 Front office faxed FMLA paperwork on 01/30/24 and will refax (today) 02/26/24. A copy will be at the front if fax is unsuccessful.

## 2024-02-27 NOTE — Telephone Encounter (Signed)
 Spoke to patient and let her know that we cannot discuss other patients via her mychart.

## 2024-03-03 DIAGNOSIS — F4323 Adjustment disorder with mixed anxiety and depressed mood: Secondary | ICD-10-CM | POA: Diagnosis not present

## 2024-03-04 DIAGNOSIS — F3176 Bipolar disorder, in full remission, most recent episode depressed: Secondary | ICD-10-CM | POA: Diagnosis not present

## 2024-03-04 DIAGNOSIS — F4321 Adjustment disorder with depressed mood: Secondary | ICD-10-CM | POA: Diagnosis not present

## 2024-03-04 DIAGNOSIS — F3174 Bipolar disorder, in full remission, most recent episode manic: Secondary | ICD-10-CM | POA: Diagnosis not present

## 2024-04-25 ENCOUNTER — Telehealth: Admitting: Physician Assistant

## 2024-04-25 DIAGNOSIS — R6889 Other general symptoms and signs: Secondary | ICD-10-CM | POA: Diagnosis not present

## 2024-04-25 DIAGNOSIS — Z20828 Contact with and (suspected) exposure to other viral communicable diseases: Secondary | ICD-10-CM

## 2024-04-26 ENCOUNTER — Other Ambulatory Visit: Payer: Self-pay | Admitting: Physician Assistant

## 2024-04-26 MED ORDER — PROMETHAZINE-DM 6.25-15 MG/5ML PO SYRP: 5.0000 mL | ORAL_SOLUTION | Freq: Four times a day (QID) | ORAL | 0 refills | Status: AC | PRN

## 2024-04-26 MED ORDER — BENZONATATE 100 MG PO CAPS: 100.0000 mg | ORAL_CAPSULE | Freq: Three times a day (TID) | ORAL | 0 refills | Status: AC | PRN

## 2024-04-26 MED ORDER — OSELTAMIVIR PHOSPHATE 75 MG PO CAPS: 75.0000 mg | ORAL_CAPSULE | Freq: Two times a day (BID) | ORAL | 0 refills | Status: AC

## 2024-04-26 NOTE — Progress Notes (Signed)
 Message sent to patient requesting further input regarding current symptoms. Awaiting patient response.

## 2024-04-26 NOTE — Progress Notes (Signed)
 E visit for Flu like symptoms   We are sorry that you are not feeling well.  Here is how we plan to help! Based on what you have shared with me it looks like you may have a respiratory virus that may be influenza.  Influenza or "the flu" is  an infection caused by a respiratory virus. The flu virus is highly contagious and persons who did not receive their yearly flu vaccination may "catch" the flu from close contact.  We have anti-viral medications to treat the viruses that cause this infection. They are not a "cure" and only shorten the course of the infection. These prescriptions are most effective when they are given within the first 2 days of "flu" symptoms. Antiviral medications are indicated if you have a high risk of complications from the flu. You should  also consider an antiviral medication if you are in close contact with someone who is at risk. These medications can help patients avoid complications from the flu but have side effects that you should know.   Possible side effects from Tamiflu or oseltamivir include nausea, vomiting, diarrhea, dizziness, headaches, eye redness, sleep problems or other respiratory symptoms. You should not take Tamiflu if you have an allergy to oseltamivir or any to the ingredients in Tamiflu.  Based upon your symptoms and potential risk factors I have prescribed Oseltamivir (Tamiflu).  It has been sent to your designated pharmacy.  You will take one 75 mg capsule orally twice a day for the next 5 days.   For nasal congestion, you may use an oral decongestant such as Mucinex  D or if you have glaucoma or high blood pressure use plain Mucinex .  Saline nasal spray or nasal drops can help and can safely be used as often as needed for congestion.  If you have a sore or scratchy throat, use a saltwater gargle-  to  teaspoon of salt dissolved in a 4-ounce to 8-ounce glass of warm water.  Gargle the solution for approximately 15-30 seconds and then spit.  It is  important not to swallow the solution.  You can also use throat lozenges/cough drops and Chloraseptic spray to help with throat pain or discomfort.  Warm or cold liquids can also be helpful in relieving throat pain.  For headache, pain or general discomfort, you can use Ibuprofen  or Tylenol  as directed.   Some authorities believe that zinc sprays or the use of Echinacea may shorten the course of your symptoms.  I have prescribed the following medications to help lessen symptoms: I have prescribed Tessalon  Perles 100 mg. You may take 1-2 capsules every 8 hours as needed for cough  You are to isolate at home until you have been fever-free for at least 24 hours without a fever-reducing medication, and symptoms have been steadily improving for 24 hours.  If you must be around other household members who do not have symptoms, you need to make sure that both you and the family members are masking consistently with a high-quality mask.  If you note any worsening of symptoms despite treatment, please seek an in-person evaluation ASAP. If you note any significant shortness of breath or any chest pain, please seek ED evaluation. Please do not delay care!  ANYONE WHO HAS FLU SYMPTOMS SHOULD: Stay home. The flu is highly contagious and going out or to work exposes others! Be sure to drink plenty of fluids. Water is fine as well as fruit juices, sodas and electrolyte beverages. You may want to stay  away from caffeine or alcohol. If you are nauseated, try taking small sips of liquids. How do you know if you are getting enough fluid? Your urine should be a pale yellow or almost colorless. Get rest. Taking a steamy shower or using a humidifier may help nasal congestion and ease sore throat pain. Using a saline nasal spray works much the same way. Cough drops, hard candies and sore throat lozenges may ease your cough. Line up a caregiver. Have someone check on you regularly.  GET HELP RIGHT AWAY IF: You cannot  keep down liquids or your medications. You become short of breath Your fell like you are going to pass out or loose consciousness. Your symptoms persist after you have completed your treatment plan  MAKE SURE YOU  Understand these instructions. Will watch your condition. Will get help right away if you are not doing well or get worse.  Your e-visit answers were reviewed by a board certified advanced clinical practitioner to complete your personal care plan.  Depending on the condition, your plan could have included both over the counter or prescription medications.  If there is a problem please reply  once you have received a response from your provider.  Your safety is important to us .  If you have drug allergies check your prescription carefully.    You can use MyChart to ask questions about today's visit, request a non-urgent call back, or ask for a work or school excuse for 24 hours related to this e-Visit. If it has been greater than 24 hours you will need to follow up with your provider, or enter a new e-Visit to address those concerns.  You will get an e-mail in the next two days asking about your experience.  I hope that your e-visit has been valuable and will speed your recovery. Thank you for using e-visits.   I have spent 5 minutes in review of e-visit questionnaire, review and updating patient chart, medical decision making and response to patient.   Elsie Velma Lunger, PA-C

## 2024-04-26 NOTE — Addendum Note (Signed)
 Addended by: VIVIENNE DELON HERO on: 04/26/2024 03:50 PM   Modules accepted: Orders

## 2024-05-25 ENCOUNTER — Encounter: Payer: Self-pay | Admitting: Family Medicine

## 2024-05-28 NOTE — Telephone Encounter (Signed)
 Pt has scheduled a Video appt with Dr. Brien for oral surgery clearance. Pt wants to know if the paperwork can be signed even if she is seeing a different provider. Please advise.

## 2024-05-28 NOTE — Telephone Encounter (Signed)
 Copied from CRM #[id number]. Topic: Appointments - Scheduling Inquiry for Clinic >> May 28, 2024 10:41 AM Myrick T wrote: Reason for CRM: patient wants a call back to to make sure if she see someone other than Dr Tanda she will get the letter she need to be sedated during a procedure at her dentist office on 2/2. Please f/u with patient

## 2024-05-28 NOTE — Telephone Encounter (Signed)
 Paperwork should be completed by the provider who does clearance exam

## 2024-05-30 NOTE — Progress Notes (Unsigned)
" ° °  Established Patient Office Visit  Subjective   Patient ID: Tracy Cohen, female    DOB: 21-Jul-1990  Age: 34 y.o. MRN: [id number]  No chief complaint on file.   Pcp wilson Preop clearance sedation for oral surgery  Last ov 01/2024  Anxiety state (Primary) Zoloft   and hydroxyzine  prescribed.    2. Seasonal allergies Patient to continue flonase   3. Smoker Discussed reduction/cessation        {History (Optional):23778}  ROS    Objective:     There were no vitals taken for this visit. {Vitals History (Optional):23777}  Physical Exam   No results found for any visits on 06/03/24.  {Labs (Optional):23779}  The ASCVD Risk score (Arnett DK, et al., 2019) failed to calculate for the following reasons:   The 2019 ASCVD risk score is only valid for ages 31 to 23   * - Cholesterol units were assumed    Assessment & Plan:   Problem List Items Addressed This Visit   None   No follow-ups on file.    Belvie Silvan, MD  "

## 2024-06-01 ENCOUNTER — Telehealth: Payer: Self-pay | Admitting: Critical Care Medicine

## 2024-06-01 NOTE — Telephone Encounter (Signed)
 Noted.

## 2024-06-01 NOTE — Telephone Encounter (Signed)
 06/01/24 Confirmed patients appointment and also reached out to Sydney to give patient a call.

## 2024-06-03 ENCOUNTER — Encounter: Payer: Self-pay | Admitting: Family Medicine

## 2024-06-03 ENCOUNTER — Ambulatory Visit: Admitting: Family Medicine

## 2024-06-03 ENCOUNTER — Ambulatory Visit: Payer: Self-pay | Admitting: Critical Care Medicine

## 2024-06-03 VITALS — BP 131/72 | HR 88 | Ht 65.0 in | Wt 159.2 lb

## 2024-06-03 DIAGNOSIS — Z01818 Encounter for other preprocedural examination: Secondary | ICD-10-CM | POA: Diagnosis not present

## 2024-06-03 DIAGNOSIS — F411 Generalized anxiety disorder: Secondary | ICD-10-CM

## 2024-06-03 NOTE — Progress Notes (Signed)
 "  Established Patient Office Visit  Subjective    Patient ID: Tracy Cohen, female    DOB: 07/19/1990  Age: 34 y.o. MRN: [id number]  CC:  Chief Complaint  Patient presents with   oral surgery clearance     HPI Tracy Cohen presents for presurgical clearance.d Patient reports that she has had a complication with a tooth implant and that extensive work has to be done for correction. Patient is extremely anxious regarding the procedure. She is wanting sedation for the procedure especially since the complication repair was extremely painful.   Outpatient Encounter Medications as of 06/03/2024  Medication Sig   albuterol  (VENTOLIN  HFA) 108 (90 Base) MCG/ACT inhaler INHALE 1-2 PUFFS INTO THE LUNGS EVERY 6 (SIX) HOURS AS NEEDED FOR SHORTNESS OF BREATH OR WHEEZING.   Cholecalciferol (VITAMIN D3) 1.25 MG (50000 UT) CAPS Take 2 capsules by mouth once a week.   etonogestrel  (NEXPLANON ) 68 MG IMPL implant 1 each by Subdermal route once.   Fremanezumab -vfrm (AJOVY ) 225 MG/1.5ML SOAJ Inject 225 mg into the skin every 28 (twenty-eight) days.   hydrOXYzine  (VISTARIL ) 25 MG capsule Take 1 capsule (25 mg total) by mouth every 8 (eight) hours as needed.   polyethylene glycol powder (GLYCOLAX /MIRALAX ) 17 GM/SCOOP powder Take 17 g by mouth daily as needed.   Rimegepant Sulfate (NURTEC) 75 MG TBDP Medication Samples have been provided to the patient.  Drug name: Nurtec       Strength: 75mg         Qty: 2 boxes  LOT: [id number]  Exp.Date: 6/28 & 11/27  Dosing instructions:   The patient has been instructed regarding the correct time, dose, and frequency of taking this medication, including desired effects and most common side effects.   [name] 8:32 AM 10/31/2023   sertraline  (ZOLOFT ) 50 MG tablet Take 1 tablet (50 mg total) by mouth daily.   benzonatate  (TESSALON ) 100 MG capsule Take 1 capsule (100 mg total) by mouth 3 (three) times daily as needed for cough. (Patient not taking: Reported  on 06/03/2024)   CAPLYTA 42 MG capsule Take 42 mg by mouth daily. (Patient not taking: Reported on 01/26/2024)   lithium carbonate (ESKALITH) 450 MG ER tablet Take 450 mg by mouth 2 (two) times daily. (Patient not taking: Reported on 01/26/2024)   predniSONE  (DELTASONE ) 10 MG tablet Take 60mg  on day 1, then 50mg  on day 2, then 40mg  on day 3, then 30mg  on day 4, then 20mg  on day 5, then 10mg  on day 6, then STOP (Patient not taking: Reported on 01/26/2024)   promethazine -dextromethorphan (PROMETHAZINE -DM) 6.25-15 MG/5ML syrup Take 5 mLs by mouth 4 (four) times daily as needed. (Patient not taking: Reported on 06/03/2024)   No facility-administered encounter medications on file as of 06/03/2024.    Past Medical History:  Diagnosis Date   Allergy    Anxiety    Depression    Migraine     Past Surgical History:  Procedure Laterality Date   ADENOIDECTOMY     TONSILLECTOMY      Family History  Problem Relation Age of Onset   High blood pressure Mother    Mental illness Father        schizophrenia   Esophageal cancer Father    Stroke Maternal Grandmother    Mental illness Maternal Grandmother        bipolar   Kidney cancer Maternal Grandmother    Cancer Maternal Grandfather    Prostate cancer Maternal Grandfather  Colon cancer Maternal Grandfather    Mental illness Paternal Grandmother    Cancer Paternal Grandmother    Kidney cancer Paternal Grandmother    Mental illness Paternal Grandfather     Social History   Socioeconomic History   Marital status: Single    Spouse name: Not on file   Number of children: 0   Years of education: 13   Highest education level: Associate degree: occupational, scientist, product/process development, or vocational program  Occupational History    Employer: [employer]  Tobacco Use   Smoking status: Former    Types: Cigarettes   Smokeless tobacco: Never  Vaping Use   Vaping status: Never Used  Substance and Sexual Activity   Alcohol use: Not Currently    Comment:  6/week (on weekend)   Drug use: Not Currently   Sexual activity: Yes    Birth control/protection: None  Other Topics Concern   Not on file  Social History Narrative   Patient lives at home alone.    Patient works for [employer]   Patient has some college education.   Right handed.      Patient now works for [employer] and has received an associated an degree.   Caffeine 16 oz daily   One story home      Social Drivers of Health   Tobacco Use: Medium Risk (06/03/2024)   Patient History    Smoking Tobacco Use: Former    Smokeless Tobacco Use: Never    Passive Exposure: Not on Actuary Strain: Not on file  Food Insecurity: No Food Insecurity (10/27/2023)   Epic    Worried About Programme Researcher, Broadcasting/film/video in the Last Year: Never true    Ran Out of Food in the Last Year: Never true  Transportation Needs: No Transportation Needs (10/27/2023)   Epic    Lack of Transportation (Medical): No    Lack of Transportation (Non-Medical): No  Physical Activity: Insufficiently Active (01/26/2024)   Exercise Vital Sign    Days of Exercise per Week: 2 days    Minutes of Exercise per Session: 60 min  Stress: Stress Concern Present (01/26/2024)   [employer] of Occupational Health - Occupational Stress Questionnaire    Feeling of Stress: Very much  Social Connections: Moderately Isolated (01/26/2024)   Social Connection and Isolation Panel    Frequency of Communication with Friends and Family: More than three times a week    Frequency of Social Gatherings with Friends and Family: Twice a week    Attends Religious Services: Never    Database Administrator or Organizations: Yes    Attends Engineer, Structural: More than 4 times per year    Marital Status: Never married  Intimate Partner Violence: Not on file  Depression (PHQ2-9): High Risk (10/27/2023)   Depression (PHQ2-9)    PHQ-2 Score: 27  Alcohol Screen: Low Risk (01/26/2024)   Alcohol Screen    Last Alcohol Screening  Score (AUDIT): 3  Housing: Not on file  Utilities: Not on file  Health Literacy: Adequate Health Literacy (01/26/2024)   B1300 Health Literacy    Frequency of need for help with medical instructions: Never    Review of Systems  All other systems reviewed and are negative.       Objective    BP 131/72   Pulse 88   Ht 5' 5 (1.651 m)   Wt 159 lb 3.2 oz (72.2 kg)   SpO2 98%   BMI 26.49 kg/m  Physical Exam Vitals and nursing note reviewed.  Constitutional:      General: She is not in acute distress. HENT:     Mouth/Throat:     Dentition: Dental tenderness present.  Cardiovascular:     Rate and Rhythm: Normal rate and regular rhythm.  Pulmonary:     Effort: Pulmonary effort is normal.     Breath sounds: Normal breath sounds.  Neurological:     General: No focal deficit present.     Mental Status: She is alert and oriented to person, place, and time.  Psychiatric:        Mood and Affect: Mood is anxious. Affect is tearful.         Assessment & Plan:   Pre-op evaluation  Anxiety state   Patient appears stable to undergo the stated procedure. However, it is recommended that IV sedation be utilized 2/2 previous complications and extreme anxiety.   Return if symptoms worsen or fail to improve.   Tanda Raguel SQUIBB, MD  "

## 2024-06-03 NOTE — Telephone Encounter (Signed)
 Patient seen 06/03/2024

## 2024-06-08 ENCOUNTER — Telehealth: Admitting: Neurology

## 2024-06-08 ENCOUNTER — Encounter: Payer: Self-pay | Admitting: Neurology

## 2024-06-08 ENCOUNTER — Telehealth: Payer: Self-pay

## 2024-06-08 DIAGNOSIS — G43009 Migraine without aura, not intractable, without status migrainosus: Secondary | ICD-10-CM

## 2024-06-08 MED ORDER — NURTEC 75 MG PO TBDP: 1.0000 | ORAL_TABLET | Freq: Every day | ORAL | 11 refills | Status: AC | PRN

## 2024-06-08 NOTE — Telephone Encounter (Signed)
 Per Dr.Jaffe, I sent in new prescription for Nurtec as acute treatment.

## 2024-06-09 ENCOUNTER — Other Ambulatory Visit (HOSPITAL_COMMUNITY): Payer: Self-pay

## 2024-06-09 ENCOUNTER — Telehealth: Payer: Self-pay | Admitting: Pharmacy Technician

## 2024-06-09 NOTE — Telephone Encounter (Signed)
 Pharmacy Patient Advocate Encounter   Received notification from Pt Calls Messages that prior authorization for NURTEC 75MG  is required/requested.   Insurance verification completed.   The patient is insured through [facility].   Per test claim: PA required; PA submitted to above mentioned insurance via Latent Key/confirmation #/EOC [id number] Status is pending

## 2024-06-09 NOTE — Telephone Encounter (Signed)
 PA has been submitted, and telephone encounter has been created. Please see telephone encounter dated 2.4.26.

## 2024-07-14 ENCOUNTER — Ambulatory Visit: Admitting: Family Medicine

## 2024-12-10 ENCOUNTER — Ambulatory Visit: Payer: Self-pay | Admitting: Neurology
